# Patient Record
Sex: Female | Born: 1935
Health system: Southern US, Community
[De-identification: ages and names within clinical notes are randomized; demographics above are authoritative.]

## PROBLEM LIST (undated history)

## (undated) DIAGNOSIS — G629 Polyneuropathy, unspecified: Secondary | ICD-10-CM

## (undated) DIAGNOSIS — I10 Essential (primary) hypertension: Secondary | ICD-10-CM

## (undated) DIAGNOSIS — C801 Malignant (primary) neoplasm, unspecified: Secondary | ICD-10-CM

## (undated) DIAGNOSIS — Z860100 Personal history of colon polyps, unspecified: Secondary | ICD-10-CM

## (undated) DIAGNOSIS — K589 Irritable bowel syndrome without diarrhea: Secondary | ICD-10-CM

## (undated) DIAGNOSIS — K5730 Diverticulosis of large intestine without perforation or abscess without bleeding: Secondary | ICD-10-CM

## (undated) DIAGNOSIS — I499 Cardiac arrhythmia, unspecified: Secondary | ICD-10-CM

## (undated) DIAGNOSIS — F0390 Unspecified dementia without behavioral disturbance: Secondary | ICD-10-CM

## (undated) DIAGNOSIS — E039 Hypothyroidism, unspecified: Secondary | ICD-10-CM

## (undated) DIAGNOSIS — R002 Palpitations: Secondary | ICD-10-CM

## (undated) HISTORY — DX: Cardiac arrhythmia, unspecified: I49.9

## (undated) HISTORY — PX: ABDOMINAL HYSTERECTOMY: SHX81

## (undated) HISTORY — DX: Irritable bowel syndrome, unspecified: K58.9

## (undated) HISTORY — DX: Hypothyroidism, unspecified: E03.9

## (undated) HISTORY — DX: Personal history of colon polyps, unspecified: Z86.0100

## (undated) HISTORY — DX: Personal history of colonic polyps: Z86.010

## (undated) HISTORY — DX: Diverticulosis of large intestine without perforation or abscess without bleeding: K57.30

## (undated) HISTORY — PX: VAGINAL HYSTERECTOMY: SHX2639

## (undated) HISTORY — PX: FOOT ARTHRODESIS: SHX1655

---

## 1998-06-20 ENCOUNTER — Other Ambulatory Visit: Admission: RE | Admit: 1998-06-20 | Discharge: 1998-06-20 | Payer: Self-pay | Admitting: Gastroenterology

## 1999-06-22 ENCOUNTER — Emergency Department (HOSPITAL_COMMUNITY): Admission: EM | Admit: 1999-06-22 | Discharge: 1999-06-22 | Payer: Self-pay | Admitting: Emergency Medicine

## 2000-04-26 ENCOUNTER — Encounter: Payer: Self-pay | Admitting: Family Medicine

## 2000-04-26 ENCOUNTER — Encounter: Admission: RE | Admit: 2000-04-26 | Discharge: 2000-04-26 | Payer: Self-pay | Admitting: *Deleted

## 2001-04-29 ENCOUNTER — Encounter: Admission: RE | Admit: 2001-04-29 | Discharge: 2001-04-29 | Payer: Self-pay | Admitting: Pulmonary Disease

## 2002-04-30 ENCOUNTER — Encounter: Admission: RE | Admit: 2002-04-30 | Discharge: 2002-04-30 | Payer: Self-pay | Admitting: Pulmonary Disease

## 2003-02-02 ENCOUNTER — Ambulatory Visit (HOSPITAL_COMMUNITY): Admission: RE | Admit: 2003-02-02 | Discharge: 2003-02-02 | Payer: Self-pay | Admitting: Pulmonary Disease

## 2003-02-24 ENCOUNTER — Ambulatory Visit (HOSPITAL_COMMUNITY): Admission: RE | Admit: 2003-02-24 | Discharge: 2003-02-24 | Payer: Self-pay | Admitting: Pulmonary Disease

## 2003-05-04 ENCOUNTER — Encounter: Admission: RE | Admit: 2003-05-04 | Discharge: 2003-05-04 | Payer: Self-pay | Admitting: Pulmonary Disease

## 2003-12-26 ENCOUNTER — Emergency Department (HOSPITAL_COMMUNITY): Admission: EM | Admit: 2003-12-26 | Discharge: 2003-12-26 | Payer: Self-pay | Admitting: Emergency Medicine

## 2004-05-04 ENCOUNTER — Encounter: Admission: RE | Admit: 2004-05-04 | Discharge: 2004-05-04 | Payer: Self-pay | Admitting: Pulmonary Disease

## 2005-05-15 ENCOUNTER — Encounter: Admission: RE | Admit: 2005-05-15 | Discharge: 2005-05-15 | Payer: Self-pay | Admitting: Pulmonary Disease

## 2005-05-17 ENCOUNTER — Encounter: Admission: RE | Admit: 2005-05-17 | Discharge: 2005-05-17 | Payer: Self-pay | Admitting: Pulmonary Disease

## 2006-05-16 ENCOUNTER — Ambulatory Visit: Payer: Self-pay | Admitting: Gastroenterology

## 2006-05-21 ENCOUNTER — Encounter: Admission: RE | Admit: 2006-05-21 | Discharge: 2006-05-21 | Payer: Self-pay | Admitting: Pulmonary Disease

## 2006-05-27 ENCOUNTER — Encounter: Payer: Self-pay | Admitting: Gastroenterology

## 2006-05-27 ENCOUNTER — Ambulatory Visit: Payer: Self-pay | Admitting: Gastroenterology

## 2007-05-26 ENCOUNTER — Encounter: Admission: RE | Admit: 2007-05-26 | Discharge: 2007-05-26 | Payer: Self-pay | Admitting: Pulmonary Disease

## 2007-05-27 ENCOUNTER — Encounter: Admission: RE | Admit: 2007-05-27 | Discharge: 2007-05-27 | Payer: Self-pay | Admitting: Pulmonary Disease

## 2007-10-13 ENCOUNTER — Encounter: Admission: RE | Admit: 2007-10-13 | Discharge: 2007-10-13 | Payer: Self-pay | Admitting: Pulmonary Disease

## 2008-05-26 ENCOUNTER — Encounter: Admission: RE | Admit: 2008-05-26 | Discharge: 2008-05-26 | Payer: Self-pay | Admitting: Internal Medicine

## 2009-05-27 ENCOUNTER — Encounter: Admission: RE | Admit: 2009-05-27 | Discharge: 2009-05-27 | Payer: Self-pay | Admitting: Internal Medicine

## 2010-05-29 ENCOUNTER — Encounter
Admission: RE | Admit: 2010-05-29 | Discharge: 2010-05-29 | Payer: Self-pay | Source: Home / Self Care | Admitting: Internal Medicine

## 2010-08-03 ENCOUNTER — Emergency Department (HOSPITAL_COMMUNITY): Admission: EM | Admit: 2010-08-03 | Discharge: 2010-08-03 | Payer: Self-pay | Admitting: Emergency Medicine

## 2010-09-18 LAB — BASIC METABOLIC PANEL
BUN: 17 mg/dL (ref 6–23)
CO2: 30 mEq/L (ref 19–32)
Calcium: 10.1 mg/dL (ref 8.4–10.5)
Chloride: 103 mEq/L (ref 96–112)
Creatinine, Ser: 1.01 mg/dL (ref 0.4–1.2)
GFR calc Af Amer: >60 mL/min (ref >60)
GFR calc non Af Amer: 53 mL/min — ABNORMAL LOW (ref >60)
Glucose, Bld: 97 mg/dL (ref 70–99)
Potassium: 4.6 mEq/L (ref 3.5–5.1)
Sodium: 140 mEq/L (ref 135–145)

## 2010-09-18 LAB — SURGICAL PCR SCREEN
MRSA, PCR: NEGATIVE
Staphylococcus aureus: NEGATIVE

## 2010-09-18 LAB — HEMOGLOBIN AND HEMATOCRIT, BLOOD
HCT: 36.4 % (ref 36.0–46.0)
Hemoglobin: 12.5 g/dL (ref 12.0–15.0)

## 2010-09-21 ENCOUNTER — Ambulatory Visit (HOSPITAL_COMMUNITY)
Admission: RE | Admit: 2010-09-21 | Discharge: 2010-09-21 | Payer: Self-pay | Source: Home / Self Care | Attending: Podiatry | Admitting: Podiatry

## 2010-09-24 ENCOUNTER — Encounter: Payer: Self-pay | Admitting: Pulmonary Disease

## 2010-09-25 ENCOUNTER — Encounter: Payer: Self-pay | Admitting: Pulmonary Disease

## 2010-10-28 NOTE — Op Note (Signed)
Brittany Holden, Brittany Holden                ACCOUNT NO.:  [id number]  MEDICAL RECORD NO.:  [id number]          PATIENT TYPE:  AMB  LOCATION:  DAY                           FACILITY:  [facility]  PHYSICIAN:  B. Theola Sequin, MD   DATE OF BIRTH:  February 26, 1930  DATE OF PROCEDURE:  09/21/2010 DATE OF DISCHARGE:  09/21/2010                              OPERATIVE REPORT   SURGEON:  B. Theola Sequin, MD  ASSISTANT:  None.  PREOPERATIVE DIAGNOSES: 1. Hammertoe deformity, second digit left foot. 2. Hammertoe deformity, third digit left foot. 3. Hammertoe deformity, fourth digit left foot.  POSTOPERATIVE DIAGNOSES: 1. Hammertoe deformity, second digit left foot. 2. Hammertoe deformity, third digit left foot. 3. Hammertoe deformity, fourth digit left foot.  PROCEDURES: 1. Hammertoe repair, second digit left foot. 2. Hammertoe repair, third digit left foot. 3. Hammertoe repair, fourth digit left foot.  ANESTHESIA:  MAC with local.  HEMOSTASIS:  Pneumatic ankle tourniquet at 250 mmHg.  ESTIMATED BLOOD LOSS:  Minimal (less than 5 mL).  INJECTABLES:  0.5% Marcaine plain.  PATHOLOGY:  None.  COMPLICATIONS:  None.  PROCEDURE IN DETAIL:  The patient was brought to the operating room, placed on the operative table in supine position.  The pneumatic ankle tourniquet was placed about the patient's left ankle.  The foot was anesthetized using 0.5% Marcaine plain.  The foot was scrubbed, prepped and draped in usual sterile manner.  The limb was then elevated, exsanguinated, and the pneumatic ankle tourniquet inflated to 250 mmHg. Attention was then directed to the dorsal aspect of the second digit of the left foot where a linear longitudinal incision was performed. Dissection was continued deep down to level the proximal interphalangeal joint.  A transverse tenotomy and capsulotomy was performed at the level of the proximal interphalangeal joint.  Soft tissues were reflected, thus exposing the head  of the proximal phalanx and base of the middle phalanx at the operative site.  The articular surface from the head of proximal phalanx and base of the middle phalanx were resected using a power bone saw.  A 2.0 mm Allofix cortical bone pin was inserted across the osteotomy site.  The contracture of the proximal interphalangeal joint was noted to be reduced.  However, persistent contracture of the distal and interphalangeal joint was noted.  A transverse percutaneous tenotomy was performed plantar to the distal interphalangeal joint.  The contracture noted at this level of the digit was noted to be reduced.  The extensor tendon was reapproximated using 4-0 Vicryl and subcutaneous structure reapproximated in 4-0 Vicryl and skin reapproximated using 5-0 Monocryl in a horizontal mattress technique.  Same procedure was performed to the third digit of the left foot, that was performed to the second digit of the left foot without alterations or omission.  The same procedure was performed to the fourth digit of the left, that was performed to the second digit without any alterations or omissions. Sterile compressive dressing was then applied to left foot.  The pneumatic ankle tourniquet deflated and prompt hyperemic response was noted to all digits of the left foot.  ______________________________ B. Theola Sequin, MD     BIM/MEDQ  D:  09/21/2010  T:  09/22/2010  Job:  [id number]  Electronically Signed by Rexford Maus  on 10/28/2010 11:07:06 AM

## 2011-01-15 ENCOUNTER — Telehealth: Payer: Self-pay | Admitting: Gastroenterology

## 2011-01-15 MED ORDER — LUBIPROSTONE 8 MCG PO CAPS: 8.0000 ug | ORAL_CAPSULE | Freq: Two times a day (BID) | ORAL | Status: DC

## 2011-01-15 NOTE — Telephone Encounter (Signed)
Notified pt Dr Jarold Motto recommends Miralax bid and Amitiza bid. I left samples for pt of Amitiza. Pt stated understanding.

## 2011-01-15 NOTE — Telephone Encounter (Signed)
miralax bid and try amitiza 8 mcg bid

## 2011-01-15 NOTE — Telephone Encounter (Signed)
Pt with hx of COLONs only with Dr Jarold Motto. COLON 05/28/2001- Diverticulosis, COLON 05/27/2006- Diverticulosis and Polyp. Pt called to report she has chronic constipation and has been taking OTC women's laxative and Align per her PCP. Pt reports she has terrible gas after she eats . Pt thinks the laxative is doing it because she hasn't had any today and she didn't take the laxative last pm. Pt is due for another COLON this September and has made an appt for 02/20/11. Can we recommend something until the appt? Thanks.

## 2011-01-16 ENCOUNTER — Telehealth: Payer: Self-pay | Admitting: Gastroenterology

## 2011-01-16 NOTE — Telephone Encounter (Addendum)
Pt reports she is still constipated even though she took the Miralax and Amitiza . She hasn't had a BM since Sunday and she had terrible pain last pm. Explained the Miralax and Amitiza are fast acting laxatives that don't work like the OTC harsh laxatives. Pt became very emotional and cried stating she feels as though she has a blockage or something is wrong. We started over and I asked if she had chronic constipation and she stated yes and I stated she probably is laxative dependent. Pt remained upset and stated she couldn't wait until September for her COLON- explained if she's having problems, Dr Jarold Motto can do an earlier COLON. Dr Jarold Motto had a cancellation on 01/18/11 and pt will come then.

## 2011-01-18 ENCOUNTER — Encounter: Payer: Self-pay | Admitting: Gastroenterology

## 2011-01-18 ENCOUNTER — Ambulatory Visit (INDEPENDENT_AMBULATORY_CARE_PROVIDER_SITE_OTHER): Payer: Medicare Other | Admitting: Gastroenterology

## 2011-01-18 ENCOUNTER — Other Ambulatory Visit (INDEPENDENT_AMBULATORY_CARE_PROVIDER_SITE_OTHER): Payer: Medicare Other

## 2011-01-18 DIAGNOSIS — K5900 Constipation, unspecified: Secondary | ICD-10-CM

## 2011-01-18 DIAGNOSIS — K589 Irritable bowel syndrome without diarrhea: Secondary | ICD-10-CM

## 2011-01-18 DIAGNOSIS — Z79899 Other long term (current) drug therapy: Secondary | ICD-10-CM

## 2011-01-18 LAB — FERRITIN: Ferritin: 26.2 ng/mL (ref 10.0–291.0)

## 2011-01-18 LAB — FOLATE: Folate: >24.8 ng/mL (ref >5.9)

## 2011-01-18 LAB — IBC PANEL: Iron: 83 ug/dL (ref 42–145)

## 2011-01-18 MED ORDER — ALIGN PO CAPS: 1.0000 | ORAL_CAPSULE | Freq: Every day | ORAL | Status: AC

## 2011-01-18 MED ORDER — PEG-KCL-NACL-NASULF-NA ASC-C 100 G PO SOLR: 1.0000 | Freq: Once | ORAL | Status: AC

## 2011-01-18 NOTE — Progress Notes (Signed)
History of Present Illness:  This is a 77 year old Caucasian female who is in overall excellent health. She has had worsening constipation since the death of her husband one year ago. 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 There is no history of rectal bleeding, lower bowel pain, but she does have gas and bloating. She has had a 10 pound weight loss probably related to her change in her cooking habits. She denies a specific food intolerances, upper gastrointestinal, or hepatobiliary problems. She is now on narcotics or NSAIDs, denies use of alcohol or cigarettes. Recent laboratory data showed a normal metabolic profile. He is on Lanoxin, simvastatin, Diovan, and levothyroxine. Review of her chart shows previous colonoscopies with adenomatous polyps, last examined 2007. She has been diagnosed as having irritable bowel syndrome exacerbated by chronic anxiety.  Becausem of these complaints she was placed on MiraLax and Amitiza but has not used these medications.  I have reviewed this patient's present history, medical and surgical past history, allergies and medications.     ROS: The remainder of the 10 point ROS is negative  Past Medical History  Diagnosis Date  . Diverticulosis of colon (without mention of hemorrhage)   . Personal history of colonic polyps 1999 & 2007    hyperplastic   . IBS (irritable bowel syndrome)   . Arrhythmia   . Hypothyroidism    Past Surgical History  Procedure Date  . Vaginal hysterectomy     reports that she has never smoked. She has never used smokeless tobacco. She reports that she does not drink alcohol or use illicit drugs. family history is not on file. No Known Allergies      Physical Exam: General well developed well nourished patient in no acute distress, appearing her stated age Eyes PERRLA, no icterus, fundoscopic exam per opthamologist Skin no lesions noted Neck supple, no adenopathy, no thyroid enlargement, no tenderness Chest clear to  percussion and auscultation Heart no significant murmurs, gallops or rubs noted Abdomen no hepatosplenomegaly masses or tenderness, BS normal.  Rectal inspection normal no fissures, or fistulae noted.  No masses or tenderness on digital exam. Stool guaiac negative. Extremities no acute joint lesions, edema, phlebitis or evidence of cellulitis. Neurologic patient oriented x 3, cranial nerves intact, no focal neurologic deficits noted. Psychological mental status normal and normal affect.  Assessment and plan: Long history of constipation predominant IBS with recent exacerbation probably related to lack of fiber in her diet and lack of by mouth fluids. She is due for followup colonoscopy which has been scheduled along with lab review. I have added probiotics to her regime and we will give adequate time to see if Amitiza is of use.  Encounter Diagnosis  Name Primary?  . Constipation Yes

## 2011-01-18 NOTE — Patient Instructions (Signed)
Your procedure has been scheduled for 01/19/2011, please follow the seperate instructions.  Please go to the basement today for your labs.  Your prescription(s) have been sent to you pharmacy.   

## 2011-01-19 ENCOUNTER — Ambulatory Visit (AMBULATORY_SURGERY_CENTER): Payer: Medicare Other | Admitting: Gastroenterology

## 2011-01-19 ENCOUNTER — Encounter: Payer: Self-pay | Admitting: Gastroenterology

## 2011-01-19 VITALS — BP 135/73 | HR 68 | Temp 98.2°F | Resp 18 | Ht 72.0 in | Wt 135.0 lb

## 2011-01-19 DIAGNOSIS — K5900 Constipation, unspecified: Secondary | ICD-10-CM

## 2011-01-19 DIAGNOSIS — K621 Rectal polyp: Secondary | ICD-10-CM

## 2011-01-19 DIAGNOSIS — K620 Anal polyp: Secondary | ICD-10-CM

## 2011-01-19 DIAGNOSIS — D126 Benign neoplasm of colon, unspecified: Secondary | ICD-10-CM

## 2011-01-19 DIAGNOSIS — Z1211 Encounter for screening for malignant neoplasm of colon: Secondary | ICD-10-CM

## 2011-01-19 LAB — CELIAC PANEL 10
Gliadin IgA: 11 U/mL (ref <20)
Gliadin IgG: 11.9 U/mL (ref <20)
IgA: 170 mg/dL (ref 68–378)
Tissue Transglut Ab: 10.6 U/mL (ref <20)

## 2011-01-19 MED ORDER — SODIUM CHLORIDE 0.9 % IV SOLN: 500.0000 mL | INTRAVENOUS | Status: DC

## 2011-01-19 NOTE — Patient Instructions (Signed)
Polyp removed today. Repeat colonoscopy in 5 years if not pre-cancerous and 10 yrs if pre-cancerous (either way the polyp is not true cancer and has been removed-polyps are considered to be benign) Try Amitiza (samples given)24 mcg twice daily and at bedtime take Miralax

## 2011-01-22 ENCOUNTER — Telehealth: Payer: Self-pay

## 2011-01-22 NOTE — Telephone Encounter (Signed)
No answer. No caller ID on answering machine.

## 2011-01-22 NOTE — Telephone Encounter (Signed)
No answer. No ID on answering machine. 

## 2011-01-23 ENCOUNTER — Encounter: Payer: Self-pay | Admitting: Gastroenterology

## 2011-02-20 ENCOUNTER — Ambulatory Visit: Payer: Self-pay | Admitting: Gastroenterology

## 2011-02-22 ENCOUNTER — Ambulatory Visit: Payer: Self-pay | Admitting: Gastroenterology

## 2011-04-04 ENCOUNTER — Other Ambulatory Visit: Payer: Self-pay | Admitting: Internal Medicine

## 2011-04-04 DIAGNOSIS — Z1231 Encounter for screening mammogram for malignant neoplasm of breast: Secondary | ICD-10-CM

## 2011-06-06 ENCOUNTER — Ambulatory Visit
Admission: RE | Admit: 2011-06-06 | Discharge: 2011-06-06 | Disposition: A | Discharge status: Home / Self Care | Payer: Medicare Other | Source: Ambulatory Visit | Attending: Internal Medicine | Admitting: Internal Medicine

## 2011-06-06 DIAGNOSIS — Z1231 Encounter for screening mammogram for malignant neoplasm of breast: Secondary | ICD-10-CM

## 2012-04-28 ENCOUNTER — Other Ambulatory Visit: Payer: Self-pay | Admitting: Internal Medicine

## 2012-04-28 DIAGNOSIS — Z1231 Encounter for screening mammogram for malignant neoplasm of breast: Secondary | ICD-10-CM

## 2012-06-11 ENCOUNTER — Ambulatory Visit: Payer: Medicare Other

## 2012-06-11 ENCOUNTER — Ambulatory Visit
Admission: RE | Admit: 2012-06-11 | Discharge: 2012-06-11 | Disposition: A | Discharge status: Home / Self Care | Payer: Medicare Other | Source: Ambulatory Visit | Attending: Internal Medicine | Admitting: Internal Medicine

## 2012-06-11 DIAGNOSIS — Z1231 Encounter for screening mammogram for malignant neoplasm of breast: Secondary | ICD-10-CM

## 2012-06-19 ENCOUNTER — Other Ambulatory Visit: Payer: Self-pay | Admitting: Otolaryngology

## 2012-06-19 ENCOUNTER — Ambulatory Visit (INDEPENDENT_AMBULATORY_CARE_PROVIDER_SITE_OTHER): Payer: Medicare Other | Admitting: Otolaryngology

## 2012-06-19 DIAGNOSIS — D3705 Neoplasm of uncertain behavior of pharynx: Secondary | ICD-10-CM

## 2012-06-19 DIAGNOSIS — D3701 Neoplasm of uncertain behavior of lip: Secondary | ICD-10-CM

## 2012-06-20 ENCOUNTER — Encounter (HOSPITAL_COMMUNITY): Payer: Self-pay

## 2012-06-20 NOTE — Patient Instructions (Addendum)
[facility]  06/20/2012   Your procedure is scheduled on:  06/26/2012  Report to [facility] at  615  AM.  Call this number if you have problems the morning of surgery: [phone]   Remember:   Do not eat food:After Midnight.  May have clear liquids:until Midnight .    Take these medicines the morning of surgery with A SIP OF WATER:  Lanoxin,synthroid,diovan   Do not wear jewelry, make-up or nail polish.  Do not wear lotions, powders, or perfumes. You may wear deodorant.  Do not shave 48 hours prior to surgery. Men may shave face and neck.  Do not bring valuables to the hospital.  Contacts, dentures or bridgework may not be worn into surgery.  Leave suitcase in the car. After surgery it may be brought to your room.  For patients admitted to the hospital, checkout time is 11:00 AM the day of discharge.   Patients discharged the day of surgery will not be allowed to drive home.  Name and phone number of your driver: family  Special Instructions: Shower using CHG 2 nights before surgery and the night before surgery.  If you shower the day of surgery use CHG.  Use special wash - you have one bottle of CHG for all showers.  You should use approximately 1/3 of the bottle for each shower.   Please read over the following fact sheets that you were given: Pain Booklet, MRSA Information, Surgical Site Infection Prevention, Anesthesia Post-op Instructions and Care and Recovery After Surgery Tonsillectomy, Adult Care After Refer to this sheet in the next few weeks. These instructions provide you with information on caring for yourself after your procedure. Your caregiver may also give you specific instructions. Your treatment has been planned according to current medical practices, but problems sometimes occur. Call your caregiver if you have any problems or questions after your procedure. HOME CARE INSTRUCTIONS   Obtain proper rest, keeping your head elevated at all times. You will  feel worn out and tired for a while.  Drink plenty of fluids. This reduces pain and hastens the healing process.  Only take over-the-counter or prescription medicines for pain, discomfort, or fever as directed by your caregiver. Do not take aspirin or nonsteroidal anti-inflammatory drugs. These medications increase the possibility of bleeding.  Sometimes the use of pain medication can cause constipation. If this happens, ask your caregiver about laxatives that you can take.  When eating, only eat a small portion of your food and then take your prescribed pain medication. Eat the remainder of your food 45 minutes later. This will make swallowing less painful.  Soft and cold foods, such as gelatin, sherbet, ice cream, frozen ice pops, and cold drinks, are usually the easiest to eat. Several days after surgery, you will be able to eat more solid food.  Avoid mouth washes and gargles.  Avoid contact with people who have upper respiratory infections, such as colds and sore throats.  An ice pack applied to your neck may help with discomfort and keep swelling down. SEEK MEDICAL CARE IF:   You have increasing pain that is not controlled with medications.  You have an oral temperature above 102 F (38.9 C).  You feel lightheaded or have a fainting spell.  You develop a rash. SEEK IMMEDIATE MEDICAL CARE IF:   You have difficulty breathing.  You experience side effects or allergic reactions to medications.  You bleed bright red blood from your throat, or  you vomit bright red blood. MAKE SURE YOU:  Understand these instructions.  Will watch your condition.  Will get help right away if you are not doing well or get worse. Document Released: 06/20/2004 Document Revised: 11/12/2011 Document Reviewed: 10/26/2010 [facility] Patient Information 2013 [place], [place]. PATIENT INSTRUCTIONS POST-ANESTHESIA  IMMEDIATELY FOLLOWING SURGERY:  Do not drive or operate machinery for the first twenty  four hours after surgery.  Do not make any important decisions for twenty four hours after surgery or while taking narcotic pain medications or sedatives.  If you develop intractable nausea and vomiting or a severe headache please notify your doctor immediately.  FOLLOW-UP:  Please make an appointment with your surgeon as instructed. You do not need to follow up with anesthesia unless specifically instructed to do so.  WOUND CARE INSTRUCTIONS (if applicable):  Keep a dry clean dressing on the anesthesia/puncture wound site if there is drainage.  Once the wound has quit draining you may leave it open to air.  Generally you should leave the bandage intact for twenty four hours unless there is drainage.  If the epidural site drains for more than 36-48 hours please call the anesthesia department.  QUESTIONS?:  Please feel free to call your physician or the hospital operator if you have any questions, and they will be happy to assist you.

## 2012-06-23 ENCOUNTER — Other Ambulatory Visit: Payer: Self-pay

## 2012-06-23 ENCOUNTER — Encounter (HOSPITAL_COMMUNITY): Payer: Self-pay

## 2012-06-23 ENCOUNTER — Encounter (HOSPITAL_COMMUNITY)
Admission: RE | Admit: 2012-06-23 | Discharge: 2012-06-23 | Disposition: A | Discharge status: Home / Self Care | Payer: Medicare Other | Source: Ambulatory Visit | Attending: Otolaryngology | Admitting: Otolaryngology

## 2012-06-23 HISTORY — DX: Polyneuropathy, unspecified: G62.9

## 2012-06-23 LAB — BASIC METABOLIC PANEL
BUN: 15 mg/dL (ref 6–23)
Chloride: 103 mEq/L (ref 96–112)
GFR calc Af Amer: 44 mL/min — ABNORMAL LOW (ref >90)
Potassium: 4.8 mEq/L (ref 3.5–5.1)
Sodium: 138 mEq/L (ref 135–145)

## 2012-06-23 LAB — HEMOGLOBIN AND HEMATOCRIT, BLOOD
HCT: 34.2 % — ABNORMAL LOW (ref 36.0–46.0)
Hemoglobin: 11.5 g/dL — ABNORMAL LOW (ref 12.0–15.0)

## 2012-06-23 LAB — SURGICAL PCR SCREEN
MRSA, PCR: NEGATIVE
Staphylococcus aureus: NEGATIVE

## 2012-06-26 ENCOUNTER — Encounter (HOSPITAL_COMMUNITY)
Admission: RE | Disposition: A | Discharge status: Home / Self Care | Payer: Self-pay | Source: Ambulatory Visit | Attending: Otolaryngology

## 2012-06-26 ENCOUNTER — Encounter (HOSPITAL_COMMUNITY): Payer: Self-pay | Admitting: Anesthesiology

## 2012-06-26 ENCOUNTER — Ambulatory Visit (HOSPITAL_COMMUNITY): Payer: Medicare Other | Admitting: Anesthesiology

## 2012-06-26 ENCOUNTER — Ambulatory Visit (HOSPITAL_COMMUNITY)
Admission: RE | Admit: 2012-06-26 | Discharge: 2012-06-26 | Disposition: A | Discharge status: Home / Self Care | Payer: Medicare Other | Source: Ambulatory Visit | Attending: Otolaryngology | Admitting: Otolaryngology

## 2012-06-26 ENCOUNTER — Encounter (HOSPITAL_COMMUNITY): Payer: Self-pay | Admitting: *Deleted

## 2012-06-26 DIAGNOSIS — J358 Other chronic diseases of tonsils and adenoids: Secondary | ICD-10-CM | POA: Insufficient documentation

## 2012-06-26 DIAGNOSIS — D3701 Neoplasm of uncertain behavior of lip: Secondary | ICD-10-CM

## 2012-06-26 DIAGNOSIS — Z9089 Acquired absence of other organs: Secondary | ICD-10-CM

## 2012-06-26 DIAGNOSIS — Z01812 Encounter for preprocedural laboratory examination: Secondary | ICD-10-CM | POA: Insufficient documentation

## 2012-06-26 DIAGNOSIS — Z01810 Encounter for preprocedural cardiovascular examination: Secondary | ICD-10-CM | POA: Insufficient documentation

## 2012-06-26 DIAGNOSIS — D3705 Neoplasm of uncertain behavior of pharynx: Secondary | ICD-10-CM

## 2012-06-26 DIAGNOSIS — D3709 Neoplasm of uncertain behavior of other specified sites of the oral cavity: Secondary | ICD-10-CM

## 2012-06-26 DIAGNOSIS — I4891 Unspecified atrial fibrillation: Secondary | ICD-10-CM | POA: Insufficient documentation

## 2012-06-26 HISTORY — PX: TONSILLECTOMY: SHX5217

## 2012-06-26 SURGERY — TONSILLECTOMY
Anesthesia: General | Site: Throat | Laterality: Right | Wound class: Clean Contaminated

## 2012-06-26 MED ORDER — OXYMETAZOLINE HCL 0.05 % NA SOLN
NASAL | Status: DC | PRN
  Administered 2012-06-26: 1 via NASAL

## 2012-06-26 MED ORDER — PROPOFOL 10 MG/ML IV BOLUS
INTRAVENOUS | Status: DC | PRN
  Administered 2012-06-26: 110 mg via INTRAVENOUS

## 2012-06-26 MED ORDER — GLYCOPYRROLATE 0.2 MG/ML IJ SOLN
INTRAMUSCULAR | Status: AC
  Filled 2012-06-26: qty 1

## 2012-06-26 MED ORDER — LACTATED RINGERS IV SOLN
INTRAVENOUS | Status: DC
  Administered 2012-06-26: 1000 mL via INTRAVENOUS

## 2012-06-26 MED ORDER — SUCCINYLCHOLINE CHLORIDE 20 MG/ML IJ SOLN
INTRAMUSCULAR | Status: DC | PRN
  Administered 2012-06-26: 100 mg via INTRAVENOUS

## 2012-06-26 MED ORDER — LIDOCAINE HCL 1 % IJ SOLN
INTRAMUSCULAR | Status: DC | PRN
  Administered 2012-06-26: 30 mg via INTRADERMAL

## 2012-06-26 MED ORDER — MIDAZOLAM HCL 2 MG/2ML IJ SOLN
INTRAMUSCULAR | Status: AC
  Filled 2012-06-26: qty 2

## 2012-06-26 MED ORDER — ONDANSETRON HCL 4 MG/2ML IJ SOLN
INTRAMUSCULAR | Status: AC
  Filled 2012-06-26: qty 2

## 2012-06-26 MED ORDER — ONDANSETRON HCL 4 MG/2ML IJ SOLN
4.0000 mg | Freq: Once | INTRAMUSCULAR | Status: AC
  Administered 2012-06-26: 4 mg via INTRAVENOUS

## 2012-06-26 MED ORDER — FENTANYL CITRATE 0.05 MG/ML IJ SOLN
INTRAMUSCULAR | Status: AC
  Filled 2012-06-26: qty 5

## 2012-06-26 MED ORDER — ROCURONIUM BROMIDE 50 MG/5ML IV SOLN
INTRAVENOUS | Status: AC
  Filled 2012-06-26: qty 1

## 2012-06-26 MED ORDER — ROCURONIUM BROMIDE 100 MG/10ML IV SOLN
INTRAVENOUS | Status: DC | PRN
  Administered 2012-06-26: 5 mg via INTRAVENOUS

## 2012-06-26 MED ORDER — OXYCODONE-ACETAMINOPHEN 5-325 MG/5ML PO SOLN: 5.0000 mL | ORAL | Status: DC | PRN

## 2012-06-26 MED ORDER — LIDOCAINE HCL (PF) 1 % IJ SOLN
INTRAMUSCULAR | Status: AC
  Filled 2012-06-26: qty 5

## 2012-06-26 MED ORDER — AMOXICILLIN 400 MG/5ML PO SUSR: 800.0000 mg | Freq: Two times a day (BID) | ORAL | Status: AC

## 2012-06-26 MED ORDER — PROPOFOL 10 MG/ML IV EMUL
INTRAVENOUS | Status: AC
  Filled 2012-06-26: qty 20

## 2012-06-26 MED ORDER — FENTANYL CITRATE 0.05 MG/ML IJ SOLN: 25.0000 ug | INTRAMUSCULAR | Status: DC | PRN

## 2012-06-26 MED ORDER — ONDANSETRON HCL 4 MG/2ML IJ SOLN: 4.0000 mg | Freq: Once | INTRAMUSCULAR | Status: DC | PRN

## 2012-06-26 MED ORDER — GLYCOPYRROLATE 0.2 MG/ML IJ SOLN
0.2000 mg | Freq: Once | INTRAMUSCULAR | Status: AC
  Administered 2012-06-26: 0.2 mg via INTRAVENOUS

## 2012-06-26 MED ORDER — SODIUM CHLORIDE 0.9 % IR SOLN
Status: DC | PRN
  Administered 2012-06-26: 1000 mL

## 2012-06-26 MED ORDER — SUCCINYLCHOLINE CHLORIDE 20 MG/ML IJ SOLN
INTRAMUSCULAR | Status: AC
  Filled 2012-06-26: qty 1

## 2012-06-26 MED ORDER — MIDAZOLAM HCL 2 MG/2ML IJ SOLN
1.0000 mg | INTRAMUSCULAR | Status: DC | PRN
  Administered 2012-06-26: 2 mg via INTRAVENOUS

## 2012-06-26 MED ORDER — FENTANYL CITRATE 0.05 MG/ML IJ SOLN
INTRAMUSCULAR | Status: DC | PRN
  Administered 2012-06-26 (×2): 50 ug via INTRAVENOUS

## 2012-06-26 SURGICAL SUPPLY — 32 items
BAG HAMPER (MISCELLANEOUS) ×2 IMPLANT
CATH ROBINSON RED A/P 10FR (CATHETERS) ×2 IMPLANT
CLEANER TIP ELECTROSURG 2X2 (MISCELLANEOUS) IMPLANT
CLOTH BEACON ORANGE TIMEOUT ST (SAFETY) ×2 IMPLANT
COAGULATOR SUCT SWTCH 10FR 6 (ELECTROSURGICAL) IMPLANT
ELECT COATED BLADE 2.86 ST (ELECTRODE) IMPLANT
ELECT REM PT RETURN 9FT ADLT (ELECTROSURGICAL) ×2
ELECTRODE REM PT RTRN 9FT ADLT (ELECTROSURGICAL) ×1 IMPLANT
FORMALIN 10 PREFIL 120ML (MISCELLANEOUS) ×1 IMPLANT
GAUZE SPONGE 4X4 16PLY XRAY LF (GAUZE/BANDAGES/DRESSINGS) ×2 IMPLANT
GLOVE BIO SURGEON STRL SZ7.5 (GLOVE) ×2 IMPLANT
GLOVE BIOGEL PI IND STRL 7.5 (GLOVE) IMPLANT
GLOVE BIOGEL PI INDICATOR 7.5 (GLOVE) ×1
GLOVE ECLIPSE 7.0 STRL STRAW (GLOVE) ×1 IMPLANT
GOWN STRL REIN XL XLG (GOWN DISPOSABLE) ×2 IMPLANT
IV NS 1000ML (IV SOLUTION) ×2
IV NS 1000ML BAXH (IV SOLUTION) ×1 IMPLANT
KIT ROOM TURNOVER AP CYSTO (KITS) ×2 IMPLANT
MANIFOLD NEPTUNE II (INSTRUMENTS) ×2 IMPLANT
MARKER SKIN DUAL TIP RULER LAB (MISCELLANEOUS) ×2 IMPLANT
PACK BASIC III (CUSTOM PROCEDURE TRAY) ×2
PACK SRG BSC III STRL LF ECLPS (CUSTOM PROCEDURE TRAY) ×1 IMPLANT
PAD ARMBOARD 7.5X6 YLW CONV (MISCELLANEOUS) ×2 IMPLANT
PENCIL FOOT CONTROL (ELECTRODE) IMPLANT
SET BASIN LINEN APH (SET/KITS/TRAYS/PACK) ×2 IMPLANT
SPONGE TONSIL 1 RF SGL (DISPOSABLE) ×2 IMPLANT
SYR BULB 3OZ (MISCELLANEOUS) ×2 IMPLANT
TOWEL OR 17X26 4PK STRL BLUE (TOWEL DISPOSABLE) ×2 IMPLANT
TUBE SALEM SUMP 12R W/ARV (TUBING) ×2 IMPLANT
WAND COBLATOR 70 EVAC XTRA (SURGICAL WAND) ×2 IMPLANT
WATER STERILE IRR 1000ML POUR (IV SOLUTION) ×2 IMPLANT
YANKAUER SUCT BULB TIP 10FT TU (MISCELLANEOUS) ×2 IMPLANT

## 2012-06-26 NOTE — Anesthesia Procedure Notes (Signed)
Procedure Name: Intubation Date/Time: 06/26/2012 7:47 AM Performed by: Glynn Octave E Pre-anesthesia Checklist: Patient identified, Patient being monitored, Timeout performed, Emergency Drugs available and Suction available Patient Re-evaluated:Patient Re-evaluated prior to inductionOxygen Delivery Method: Circle System Utilized Preoxygenation: Pre-oxygenation with 100% oxygen Intubation Type: IV induction Ventilation: Mask ventilation without difficulty Laryngoscope Size: Mac and 3 Grade View: Grade I Tube type: Oral Tube size: 7.0 mm Number of attempts: 1 Airway Equipment and Method: stylet Placement Confirmation: ETT inserted through vocal cords under direct vision,  positive ETCO2 and breath sounds checked- equal and bilateral Secured at: 21 cm Tube secured with: Tape Dental Injury: Teeth and Oropharynx as per pre-operative assessment

## 2012-06-26 NOTE — Op Note (Signed)
Brittany Holden, BLACKSHER                ACCOUNT NO.:  [id number]  MEDICAL RECORD NO.:  [id number]  LOCATION:  [facility]                          FACILITY:  [facility]  PHYSICIAN:  Newman Pies, MD            DATE OF BIRTH:  July 14, 1930  DATE OF PROCEDURE:  06/26/2012 DATE OF DISCHARGE:  06/26/2012                              OPERATIVE REPORT   PREOPERATIVE DIAGNOSIS:  Right tonsillar mass.  POSTOPERATIVE DIAGNOSIS:  Right tonsillar mass.  PROCEDURE PERFORMED:  Right tonsillectomy.  ANESTHESIA:  General endotracheal tube anesthesia.  COMPLICATIONS:  None.  ESTIMATED BLOOD LOSS:  Minimal.  INDICATION FOR PROCEDURE:  The patient is a 78 year old female with a cystic mass at the superior portion of the right tonsil.  The mass has been present for the past year.  It has caused significant anxiety for the patient.  As a result, the decision was made for the patient to undergo the right tonsillectomy procedure.  The risks, benefits, alternatives, and details of the procedure were discussed with the patient.  Questions were invited and answered.  Informed consent was obtained.  DESCRIPTION:  The patient was taken to the operating room and placed supine on the operating table.  General endotracheal tube anesthesia was administered by the anesthesiologist.  The patient was positioned and prepped and draped in a standard fashion for tonsillectomy.  A Crowe- Davis mouth gag was inserted into the oral cavity for exposure.  A 1 cm benign-appearing mass is noted at the right superior tonsillar fossa. The right tonsil was then grasped with a straight Allis clamp and retracted medially.  It was resected free from the underlying pharyngeal constrictor muscles with the Coblator device.  The entire tonsils, including the superior tonsillar cyst was sent to the pathology department for permanent histologic identification.  The surgical site was copiously irrigated.  The mouth gag was removed.  The care of  the patient was turned over to the anesthesiologist.  The patient was awakened from anesthesia without difficulty.  She was transferred to the recovery room in good condition.  OPERATIVE FINDINGS:  A 1 cm right superior tonsillar mass is again noted.  SPECIMEN:  Right tonsils.  FOLLOWUP CARE:  The patient will be discharged home once she is awake and alert.  She will be placed on Roxicet 5 mL q.4 hours p.r.n. pain, and amoxicillin 800 mg p.o. b.i.d. for 5 days.  The patient will follow up in my office in 2 weeks.     Newman Pies, MD     ST/MEDQ  D:  06/26/2012  T:  06/26/2012  Job:  [id number]  cc:   Catalina Pizza, M.D. Fax: [phone]

## 2012-06-26 NOTE — Anesthesia Postprocedure Evaluation (Signed)
  Anesthesia Post-op Note  Patient: Brittany Holden  Procedure(s) Performed: Procedure(s) (LRB) with comments: TONSILLECTOMY (Right)  Patient Location: PACU  Anesthesia Type: General  Level of Consciousness: awake, alert  and oriented  Airway and Oxygen Therapy: Patient Spontanous Breathing and Patient connected to face mask oxygen  Post-op Pain: none  Post-op Assessment: Post-op Vital signs reviewed, Patient's Cardiovascular Status Stable, Respiratory Function Stable, Patent Airway and No signs of Nausea or vomiting  Post-op Vital Signs: Reviewed and stable  Complications: No apparent anesthesia complications

## 2012-06-26 NOTE — Brief Op Note (Signed)
06/26/2012  8:03 AM  PATIENT:  Brittany Holden  78 y.o. female  PRE-OPERATIVE DIAGNOSIS:  right tonsil mass  POST-OPERATIVE DIAGNOSIS:  right tonsil mass  PROCEDURE:  Procedure(s) (LRB) with comments: TONSILLECTOMY (Right)  SURGEON:  Surgeon(s) and Role:    * Darletta Moll, MD - Primary  PHYSICIAN ASSISTANT:   ASSISTANTS: none   ANESTHESIA:   general  EBL:  Total I/O In: 400 [I.V.:400] Out: 0   BLOOD ADMINISTERED:none  DRAINS: none   LOCAL MEDICATIONS USED:  NONE  SPECIMEN:  Source of Specimen:  right tonsil  DISPOSITION OF SPECIMEN:  PATHOLOGY  COUNTS:  YES  TOURNIQUET:  * No tourniquets in log *  DICTATION: .Other Dictation: Dictation Number [id number]  PLAN OF CARE: Discharge to home after PACU  PATIENT DISPOSITION:  PACU - hemodynamically stable.   Delay start of Pharmacological VTE agent (>24hrs) due to surgical blood loss or risk of bleeding: not applicable

## 2012-06-26 NOTE — H&P (Signed)
  H&P Update  Pt's original H&P dated 06/19/12 reviewed and placed in chart (to be scanned).  I personally examined the patient today.  No change in health. Proceed with right tonsillectomy.

## 2012-06-26 NOTE — Progress Notes (Signed)
Awake. resp adequate/nonlabored. Scant amt bloody drainage in back of throat. Denies pain.

## 2012-06-26 NOTE — Transfer of Care (Signed)
Immediate Anesthesia Transfer of Care Note  Patient: Brittany Holden  Procedure(s) Performed: Procedure(s) (LRB) with comments: TONSILLECTOMY (Right)  Patient Location: PACU  Anesthesia Type: General  Level of Consciousness: awake, alert  and oriented  Airway & Oxygen Therapy: Patient Spontanous Breathing and Patient connected to face mask oxygen  Post-op Assessment: Report given to PACU RN  Post vital signs: Reviewed and stable  Complications: No apparent anesthesia complications

## 2012-06-26 NOTE — Anesthesia Preprocedure Evaluation (Signed)
Anesthesia Evaluation  Patient identified by MRN, date of birth, ID band Patient awake    Reviewed: Allergy & Precautions, H&P , NPO status , Patient's Chart, lab work & pertinent test results  History of Anesthesia Complications Negative for: history of anesthetic complications  Airway Mallampati: II TM Distance: <3 FB     Dental  (+) Edentulous Upper   Pulmonary neg pulmonary ROS,  breath sounds clear to auscultation        Cardiovascular - angina+ dysrhythmias Atrial Fibrillation Rhythm:Irregular Rate:Normal     Neuro/Psych    GI/Hepatic   Endo/Other  Hypothyroidism   Renal/GU      Musculoskeletal   Abdominal   Peds  Hematology   Anesthesia Other Findings   Reproductive/Obstetrics                           Anesthesia Physical Anesthesia Plan  ASA: III  Anesthesia Plan: General   Post-op Pain Management:    Induction: Intravenous  Airway Management Planned: Oral ETT  Additional Equipment:   Intra-op Plan:   Post-operative Plan: Extubation in OR  Informed Consent: I have reviewed the patients History and Physical, chart, labs and discussed the procedure including the risks, benefits and alternatives for the proposed anesthesia with the patient or authorized representative who has indicated his/her understanding and acceptance.     Plan Discussed with:   Anesthesia Plan Comments:         Anesthesia Quick Evaluation

## 2012-06-26 NOTE — Progress Notes (Signed)
Awake. Ice chips offered/accepted. Tolerated well. Denies pain. Refuses pain med.

## 2012-06-30 ENCOUNTER — Encounter (HOSPITAL_COMMUNITY): Payer: Self-pay | Admitting: Otolaryngology

## 2012-07-10 ENCOUNTER — Ambulatory Visit (INDEPENDENT_AMBULATORY_CARE_PROVIDER_SITE_OTHER): Payer: Medicare Other | Admitting: Otolaryngology

## 2013-05-11 ENCOUNTER — Other Ambulatory Visit: Payer: Self-pay

## 2013-05-11 DIAGNOSIS — Z1231 Encounter for screening mammogram for malignant neoplasm of breast: Secondary | ICD-10-CM

## 2013-06-17 ENCOUNTER — Ambulatory Visit
Admission: RE | Admit: 2013-06-17 | Discharge: 2013-06-17 | Disposition: A | Discharge status: Home / Self Care | Payer: Medicare Other | Source: Ambulatory Visit

## 2013-06-17 DIAGNOSIS — Z1231 Encounter for screening mammogram for malignant neoplasm of breast: Secondary | ICD-10-CM

## 2014-05-11 ENCOUNTER — Encounter: Payer: Self-pay | Admitting: Gastroenterology

## 2014-05-11 ENCOUNTER — Other Ambulatory Visit: Payer: Self-pay

## 2014-05-11 DIAGNOSIS — Z1231 Encounter for screening mammogram for malignant neoplasm of breast: Secondary | ICD-10-CM

## 2014-06-23 ENCOUNTER — Ambulatory Visit
Admission: RE | Admit: 2014-06-23 | Discharge: 2014-06-23 | Disposition: A | Discharge status: Home / Self Care | Payer: Commercial Managed Care - HMO | Source: Ambulatory Visit

## 2014-06-23 DIAGNOSIS — Z1231 Encounter for screening mammogram for malignant neoplasm of breast: Secondary | ICD-10-CM

## 2014-09-06 ENCOUNTER — Other Ambulatory Visit (HOSPITAL_COMMUNITY): Payer: Self-pay | Admitting: Internal Medicine

## 2014-09-06 ENCOUNTER — Ambulatory Visit (HOSPITAL_COMMUNITY)
Admission: RE | Admit: 2014-09-06 | Discharge: 2014-09-06 | Disposition: A | Discharge status: Home / Self Care | Payer: Commercial Managed Care - HMO | Source: Ambulatory Visit | Attending: Internal Medicine | Admitting: Internal Medicine

## 2014-09-06 DIAGNOSIS — R609 Edema, unspecified: Secondary | ICD-10-CM

## 2014-09-06 DIAGNOSIS — M7989 Other specified soft tissue disorders: Secondary | ICD-10-CM | POA: Diagnosis not present

## 2014-09-06 DIAGNOSIS — M1712 Unilateral primary osteoarthritis, left knee: Secondary | ICD-10-CM | POA: Diagnosis not present

## 2014-09-06 DIAGNOSIS — M25562 Pain in left knee: Secondary | ICD-10-CM | POA: Diagnosis not present

## 2014-11-11 ENCOUNTER — Observation Stay (HOSPITAL_COMMUNITY)
Admission: EM | Admit: 2014-11-11 | Discharge: 2014-11-12 | Disposition: A | Discharge status: Home / Self Care | Payer: Commercial Managed Care - HMO | Attending: Internal Medicine | Admitting: Internal Medicine

## 2014-11-11 ENCOUNTER — Encounter (HOSPITAL_COMMUNITY): Payer: Self-pay

## 2014-11-11 ENCOUNTER — Emergency Department (HOSPITAL_COMMUNITY): Payer: Commercial Managed Care - HMO

## 2014-11-11 DIAGNOSIS — E785 Hyperlipidemia, unspecified: Secondary | ICD-10-CM | POA: Diagnosis present

## 2014-11-11 DIAGNOSIS — Z7982 Long term (current) use of aspirin: Secondary | ICD-10-CM | POA: Diagnosis not present

## 2014-11-11 DIAGNOSIS — I129 Hypertensive chronic kidney disease with stage 1 through stage 4 chronic kidney disease, or unspecified chronic kidney disease: Secondary | ICD-10-CM | POA: Diagnosis not present

## 2014-11-11 DIAGNOSIS — E039 Hypothyroidism, unspecified: Secondary | ICD-10-CM | POA: Insufficient documentation

## 2014-11-11 DIAGNOSIS — N183 Chronic kidney disease, stage 3 unspecified: Secondary | ICD-10-CM | POA: Diagnosis present

## 2014-11-11 DIAGNOSIS — R079 Chest pain, unspecified: Secondary | ICD-10-CM | POA: Diagnosis not present

## 2014-11-11 DIAGNOSIS — I10 Essential (primary) hypertension: Secondary | ICD-10-CM | POA: Diagnosis not present

## 2014-11-11 DIAGNOSIS — E119 Type 2 diabetes mellitus without complications: Secondary | ICD-10-CM | POA: Diagnosis not present

## 2014-11-11 DIAGNOSIS — M8580 Other specified disorders of bone density and structure, unspecified site: Secondary | ICD-10-CM | POA: Insufficient documentation

## 2014-11-11 DIAGNOSIS — R072 Precordial pain: Secondary | ICD-10-CM | POA: Diagnosis not present

## 2014-11-11 LAB — BASIC METABOLIC PANEL
Anion gap: 8 (ref 5–15)
BUN: 18 mg/dL (ref 6–23)
CALCIUM: 9.7 mg/dL (ref 8.4–10.5)
CO2: 27 mmol/L (ref 19–32)
Chloride: 105 mmol/L (ref 96–112)
Creatinine, Ser: 1.39 mg/dL — ABNORMAL HIGH (ref 0.50–1.10)
GFR, EST AFRICAN AMERICAN: 40 mL/min — AB (ref >90)
GFR, EST NON AFRICAN AMERICAN: 34 mL/min — AB (ref >90)
GLUCOSE: 110 mg/dL — AB (ref 70–99)
Potassium: 4.7 mmol/L (ref 3.5–5.1)
Sodium: 140 mmol/L (ref 135–145)

## 2014-11-11 LAB — I-STAT TROPONIN, ED: TROPONIN I, POC: 0 ng/mL (ref 0.00–0.08)

## 2014-11-11 LAB — CBC
HCT: 36.4 % (ref 36.0–46.0)
Hemoglobin: 11.8 g/dL — ABNORMAL LOW (ref 12.0–15.0)
MCH: 29.4 pg (ref 26.0–34.0)
MCHC: 32.4 g/dL (ref 30.0–36.0)
MCV: 90.8 fL (ref 78.0–100.0)
Platelets: 251 10*3/uL (ref 150–400)
RBC: 4.01 MIL/uL (ref 3.87–5.11)
RDW: 12.1 % (ref 11.5–15.5)
WBC: 6.3 10*3/uL (ref 4.0–10.5)

## 2014-11-11 LAB — DIGOXIN LEVEL: DIGOXIN LVL: 1 ng/mL (ref 0.8–2.0)

## 2014-11-11 MED ORDER — ONDANSETRON HCL 4 MG/2ML IJ SOLN: 4.0000 mg | Freq: Three times a day (TID) | INTRAMUSCULAR | Status: DC | PRN

## 2014-11-11 MED ORDER — ACETAMINOPHEN 325 MG PO TABS: 650.0000 mg | ORAL_TABLET | ORAL | Status: DC | PRN

## 2014-11-11 MED ORDER — ONDANSETRON HCL 4 MG/2ML IJ SOLN: 4.0000 mg | Freq: Four times a day (QID) | INTRAMUSCULAR | Status: DC | PRN

## 2014-11-11 MED ORDER — DOCUSATE SODIUM 100 MG PO CAPS
100.0000 mg | ORAL_CAPSULE | Freq: Every day | ORAL | Status: DC | PRN
  Administered 2014-11-12: 100 mg via ORAL
  Filled 2014-11-11: qty 1

## 2014-11-11 MED ORDER — ASPIRIN EC 325 MG PO TBEC
325.0000 mg | DELAYED_RELEASE_TABLET | Freq: Every day | ORAL | Status: DC
Start: 2014-11-12 — End: 2014-11-12
  Administered 2014-11-12: 325 mg via ORAL
  Filled 2014-11-11: qty 1

## 2014-11-11 MED ORDER — MORPHINE SULFATE 2 MG/ML IJ SOLN: 2.0000 mg | INTRAMUSCULAR | Status: DC | PRN

## 2014-11-11 MED ORDER — LEVOTHYROXINE SODIUM 75 MCG PO TABS
75.0000 ug | ORAL_TABLET | Freq: Every day | ORAL | Status: DC
  Administered 2014-11-12: 75 ug via ORAL
  Filled 2014-11-11 (×2): qty 1

## 2014-11-11 MED ORDER — SIMVASTATIN 20 MG PO TABS
20.0000 mg | ORAL_TABLET | Freq: Every morning | ORAL | Status: DC
  Administered 2014-11-12: 20 mg via ORAL
  Filled 2014-11-11: qty 1

## 2014-11-11 MED ORDER — ENOXAPARIN SODIUM 40 MG/0.4ML ~~LOC~~ SOLN
40.0000 mg | SUBCUTANEOUS | Status: DC
  Administered 2014-11-12: 40 mg via SUBCUTANEOUS
  Filled 2014-11-11: qty 0.4

## 2014-11-11 MED ORDER — DIGOXIN 250 MCG PO TABS
250.0000 ug | ORAL_TABLET | Freq: Every day | ORAL | Status: DC
  Administered 2014-11-12: 250 ug via ORAL
  Filled 2014-11-11: qty 1

## 2014-11-11 MED ORDER — LOSARTAN POTASSIUM 50 MG PO TABS
100.0000 mg | ORAL_TABLET | Freq: Every day | ORAL | Status: DC
  Administered 2014-11-12: 100 mg via ORAL
  Filled 2014-11-11: qty 2

## 2014-11-11 NOTE — ED Provider Notes (Signed)
CSN: [id number]     Arrival date & time 11/11/14  1751 History   First MD Initiated Contact with Patient 11/11/14 2017     Chief Complaint  Patient presents with  . Chest Pain     (Consider location/radiation/quality/duration/timing/severity/associated sxs/prior Treatment) Patient is a 81 y.o. female presenting with chest pain. The history is provided by the patient.  Chest Pain Pain location:  Substernal area Pain quality: pressure   Pain radiates to:  Does not radiate Pain radiates to the back: no   Pain severity:  Severe Onset quality:  Gradual Duration:  3 hours Timing:  Constant Progression:  Resolved Chronicity:  New Context: at rest   Relieved by:  None tried Worsened by:  Nothing tried Ineffective treatments:  Aspirin Associated symptoms: no abdominal pain, no anorexia, no cough, no fever, no heartburn, no nausea, no shortness of breath and not vomiting   Risk factors: high cholesterol and hypertension   Risk factors: no coronary artery disease, no diabetes mellitus and no prior DVT/PE     Past Medical History  Diagnosis Date  . Diverticulosis of colon (without mention of hemorrhage)   . Personal history of colonic polyps 1999 & 2007    hyperplastic   . IBS (irritable bowel syndrome)   . Arrhythmia   . Hypothyroidism   . Neuropathy    Past Surgical History  Procedure Laterality Date  . Vaginal hysterectomy    . Abdominal hysterectomy    . Foot arthrodesis      pipj digits  2-4 left foot  . Tonsillectomy  06/26/2012    Procedure: TONSILLECTOMY;  Surgeon: Ascencion Dike, MD;  Location: [facility];  Service: ENT;  Laterality: Right;   Family History  Problem Relation Age of Onset  . CAD Neg Hx    History  Substance Use Topics  . Smoking status: Never Smoker   . Smokeless tobacco: Never Used  . Alcohol Use: No   OB History    No data available     Review of Systems  Constitutional: Negative for fever.  Respiratory: Negative for cough and shortness of  breath.   Cardiovascular: Positive for chest pain.  Gastrointestinal: Negative for heartburn, nausea, vomiting, abdominal pain and anorexia.  All other systems reviewed and are negative.     Allergies  Review of patient's allergies indicates no known allergies.  Home Medications   Prior to Admission medications   Medication Sig Start Date End Date Taking? Authorizing Provider  aspirin EC 81 MG tablet Take 81 mg by mouth daily.   Yes Historical Provider, MD  digoxin (LANOXIN) 0.25 MG tablet Take 250 mcg by mouth daily.     Yes Historical Provider, MD  levothyroxine (SYNTHROID, LEVOTHROID) 75 MCG tablet Take 75 mcg by mouth daily.     Yes Historical Provider, MD  losartan (COZAAR) 100 MG tablet Take 100 mg by mouth daily.   Yes Historical Provider, MD  Multiple Minerals-Vitamins (CALCIUM & VIT D3 BONE HEALTH PO) Take 1 tablet by mouth daily.   Yes Historical Provider, MD  Multiple Vitamins-Minerals (MULTIVITAMIN WITH MINERALS) tablet Take 1 tablet by mouth daily.   Yes Historical Provider, MD  simvastatin (ZOCOR) 20 MG tablet Take 20 mg by mouth every morning.    Yes Historical Provider, MD  oxyCODONE-acetaminophen (ROXICET) 5-325 MG/5ML solution Take 5 mLs by mouth every 4 (four) hours as needed for pain. 06/26/12   Leta Baptist, MD   BP 153/86 mmHg  Pulse 65  Temp(Src) 98.5 F (  36.9 C) (Oral)  Resp 24  SpO2 100% Physical Exam  Constitutional: She is oriented to person, place, and time. She appears well-developed and well-nourished. No distress.  Pleasant, very healthy appearing female in NAD  HENT:  Head: Normocephalic and atraumatic.  Mouth/Throat: No oropharyngeal exudate.  Eyes: EOM are normal. Pupils are equal, round, and reactive to light.  Neck: Normal range of motion. Neck supple.  Cardiovascular: Normal rate, regular rhythm and intact distal pulses.   Murmur (blowing systolic) heard. Pulmonary/Chest: Effort normal and breath sounds normal. No respiratory distress. She has  no wheezes. She has no rales.  Abdominal: Soft. Bowel sounds are normal. She exhibits no distension. There is no tenderness.  Neurological: She is alert and oriented to person, place, and time.  Skin: Skin is warm and dry. She is not diaphoretic.  Psychiatric: She has a normal mood and affect. Her behavior is normal. Judgment and thought content normal.  Nursing note and vitals reviewed.   ED Course  Procedures (including critical care time) Labs Review Labs Reviewed  CBC - Abnormal; Notable for the following:    Hemoglobin 11.8 (*)    All other components within normal limits  BASIC METABOLIC PANEL - Abnormal; Notable for the following:    Glucose, Bld 110 (*)    Creatinine, Ser 1.39 (*)    GFR calc non Af Amer 34 (*)    GFR calc Af Amer 40 (*)    All other components within normal limits  DIGOXIN LEVEL  I-STAT TROPOININ, ED    Imaging Review Dg Chest 2 View  11/11/2014   CLINICAL DATA:  Chest pain and pressure for 4 days. History of hypertension.  EXAM: CHEST  2 VIEW  COMPARISON:  None  FINDINGS: Hyperinflation. Mild vertebral body height loss within the upper thoracic spine. S-shaped thoracolumbar spine curvature. Midline trachea. Normal heart size and mediastinal contours for age. No pleural effusion or pneumothorax. Mild scarring at the medial left lung base.  IMPRESSION: Hyperinflation, without acute disease.  Osteopenia with a mild compression deformity in the upper thoracic spine. Indeterminate acuity.   Electronically Signed   By: Abigail Miyamoto M.D.   On: 11/11/2014 20:05     EKG Interpretation   Date/Time:  Thursday November 11 2014 18:24:40 EST Ventricular Rate:  118 PR Interval:  178 QRS Duration: 78 QT Interval:  312 QTC Calculation: 437 R Axis:   94 Text Interpretation:  Sinus tachycardia Right atrial enlargement Rightward  axis Pulmonary disease pattern Nonspecific ST abnormality Abnormal ECG No  significant change was found Confirmed by CAMPOS  MD, Lennette Bihari ([id number])  on  11/11/2014 8:50:12 PM      MDM   Final diagnoses:  Chest pain with high risk for cardiac etiology   Brittany Holden is a 81 y.o. female who presents with chest pressure. Multiple episodes since Monday. Attributed initially to some yard work but has occurred multiple times since then. Today, at rest, with severe chest pressure for about 3 hours. Took 324 ASA prior to arrival. Chest pain free in the lobby.  On my exam, AFVSS. Chest pain free. EKG with non-ischemic changes. CXR without consolidation, effusion, widened mediastinum, pneumothorax. Initial troponin negative. HEART score of 4. Digoxin level checked and normal. High risk based on history and risk factors. Will admit for provocative testing. Admitted to hospitalist.  Larence Penning, MD 11/11/14 7341  Jola Schmidt, MD 11/11/14 [phone]

## 2014-11-11 NOTE — H&P (Addendum)
[facility] History and Physical  Brittany Holden EXN:[id number] DOB: [birth year] DOA: 11/11/2014  Referring physician: ER physician. PCP: Delphina Cahill, MD   Chief Complaint: Chest pain.  HPI: Brittany Holden is a 81 y.o. female with history of hypertension, hyperlipidemia, hypothyroidism and arrhythmia presents to the ER because of chest pain. Patient states she has been having chest pain off and on for last 3-4 days. Pain is mostly retrosternal and nonradiating pressure-like last for a few minutes each time. But this evening patient chest pain became more severe and patient presented to the ER. While awaiting patient's chest pain completely resolved without any intervention. Patient denies any associated shortness of breath diaphoresis palpitations nausea vomiting abdominal pain fever chills or any productive cough. In the ER EKG showed nonspecific findings and chest x-ray was largely unremarkable. Cardiac markers were negative and patient has been admitted for further management.   Review of Systems: As presented in the history of presenting illness, rest negative.  Past Medical History  Diagnosis Date  . Diverticulosis of colon (without mention of hemorrhage)   . Personal history of colonic polyps 1999 & 2007    hyperplastic   . IBS (irritable bowel syndrome)   . Arrhythmia   . Hypothyroidism   . Neuropathy    Past Surgical History  Procedure Laterality Date  . Vaginal hysterectomy    . Abdominal hysterectomy    . Foot arthrodesis      pipj digits  2-4 left foot  . Tonsillectomy  06/26/2012    Procedure: TONSILLECTOMY;  Surgeon: Ascencion Dike, MD;  Location: [facility];  Service: ENT;  Laterality: Right;   Social History:  reports that she has never smoked. She has never used smokeless tobacco. She reports that she does not drink alcohol or use illicit drugs. Where does patient live at home. Can patient participate in ADLs? Yes.  No Known Allergies  Family History:  Family  History  Problem Relation Age of Onset  . CAD Neg Hx       Prior to Admission medications   Medication Sig Start Date End Date Taking? Authorizing Provider  aspirin EC 81 MG tablet Take 81 mg by mouth daily.   Yes Historical Provider, MD  digoxin (LANOXIN) 0.25 MG tablet Take 250 mcg by mouth daily.     Yes Historical Provider, MD  levothyroxine (SYNTHROID, LEVOTHROID) 75 MCG tablet Take 75 mcg by mouth daily.     Yes Historical Provider, MD  losartan (COZAAR) 100 MG tablet Take 100 mg by mouth daily.   Yes Historical Provider, MD  Multiple Minerals-Vitamins (CALCIUM & VIT D3 BONE HEALTH PO) Take 1 tablet by mouth daily.   Yes Historical Provider, MD  Multiple Vitamins-Minerals (MULTIVITAMIN WITH MINERALS) tablet Take 1 tablet by mouth daily.   Yes Historical Provider, MD  simvastatin (ZOCOR) 20 MG tablet Take 20 mg by mouth every morning.    Yes Historical Provider, MD  oxyCODONE-acetaminophen (ROXICET) 5-325 MG/5ML solution Take 5 mLs by mouth every 4 (four) hours as needed for pain. 06/26/12   Leta Baptist, MD    Physical Exam: Filed Vitals:   11/11/14 2145 11/11/14 2154 11/11/14 2215 11/11/14 2236  BP: 150/75  153/86 161/71  Pulse: 64  65 80  Temp:  98.5 F (36.9 C)  98.2 F (36.8 C)  TempSrc:  Oral  Oral  Resp: 13  24   SpO2: 99%  100% 100%     General:  Well-developed and nourished.  Eyes: Anicteric no  pallor.  ENT: No discharge from the ears eyes nose and mouth.  Neck: No mass felt.  Cardiovascular: S1-S2 heard.  Respiratory: No rhonchi or crepitations.  Abdomen: Soft nontender bowel sounds present.  Skin: No rash.  Musculoskeletal: No edema.  Psychiatric: Appears normal.  Neurologic: Alert awake oriented to time place and person. Moves all extremities.  Labs on Admission:  Basic Metabolic Panel:  Recent Labs Lab 11/11/14 1817  NA 140  K 4.7  CL 105  CO2 27  GLUCOSE 110*  BUN 18  CREATININE 1.39*  CALCIUM 9.7   Liver Function Tests: No results  for input(s): AST, ALT, ALKPHOS, BILITOT, PROT, ALBUMIN in the last 168 hours. No results for input(s): LIPASE, AMYLASE in the last 168 hours. No results for input(s): AMMONIA in the last 168 hours. CBC:  Recent Labs Lab 11/11/14 1817  WBC 6.3  HGB 11.8*  HCT 36.4  MCV 90.8  PLT 251   Cardiac Enzymes: No results for input(s): CKTOTAL, CKMB, CKMBINDEX, TROPONINI in the last 168 hours.  BNP (last 3 results) No results for input(s): BNP in the last 8760 hours.  ProBNP (last 3 results) No results for input(s): PROBNP in the last 8760 hours.  CBG: No results for input(s): GLUCAP in the last 168 hours.  Radiological Exams on Admission: Dg Chest 2 View  11/11/2014   CLINICAL DATA:  Chest pain and pressure for 4 days. History of hypertension.  EXAM: CHEST  2 VIEW  COMPARISON:  None  FINDINGS: Hyperinflation. Mild vertebral body height loss within the upper thoracic spine. S-shaped thoracolumbar spine curvature. Midline trachea. Normal heart size and mediastinal contours for age. No pleural effusion or pneumothorax. Mild scarring at the medial left lung base.  IMPRESSION: Hyperinflation, without acute disease.  Osteopenia with a mild compression deformity in the upper thoracic spine. Indeterminate acuity.   Electronically Signed   By: Abigail Miyamoto M.D.   On: 11/11/2014 20:05    EKG: Independently reviewed. Patient had 2 EKG first one showing normal sinus rhythm with nonspecific ST-T changes and second one was showing sinus tachycardia.  Assessment/Plan Principal Problem:   Chest pain Active Problems:   CKD (chronic kidney disease) stage 3, GFR 30-59 ml/min   Essential hypertension   Hyperlipidemia   1. Chest pain - at this time patient is chest pain-free. Given the risk factors including hypertension hyperlipidemia and age we will cycle cardiac markers keep patient nothing by mouth in a.m. in anticipation of possible cardiac markers check 2-D echo and aspirin. 2. Hypertension -  continue present medications. 3. Hyperlipidemia - continue statins. 4. Hypothyroidism - continue Synthroid. 5. History of arrhythmia on digoxin. Patient states she has been taking digoxin for many years now. 6. Mild anemia - follow CBC and further workup as outpatient. 7. Mild compression deformity in the upper thoracic spine per the chest x-ray. 8. Chronic kidney disease stage III - follow metabolic panel.   DVT Prophylaxis Lovenox.  Code Status: Full code.  Family Communication: Patient's family at the bedside.  Disposition Plan: Admit for observation.    KAKRAKANDY,ARSHAD N. [facility] Pager [phone].  If 7PM-7AM, please contact night-coverage [email or web address] Password [facility] 11/11/2014, 10:38 PM

## 2014-11-11 NOTE — ED Notes (Signed)
Pt reporting chest pain since Monday.  Sts it was mild then and has progressively gotten worse.  Sts it got bad about an hr ago and she took 324 ASA with no pain relief.  Pt with no cardiac hx.

## 2014-11-12 DIAGNOSIS — R079 Chest pain, unspecified: Secondary | ICD-10-CM

## 2014-11-12 DIAGNOSIS — Z7982 Long term (current) use of aspirin: Secondary | ICD-10-CM | POA: Diagnosis not present

## 2014-11-12 DIAGNOSIS — I129 Hypertensive chronic kidney disease with stage 1 through stage 4 chronic kidney disease, or unspecified chronic kidney disease: Secondary | ICD-10-CM | POA: Diagnosis not present

## 2014-11-12 DIAGNOSIS — M8580 Other specified disorders of bone density and structure, unspecified site: Secondary | ICD-10-CM | POA: Diagnosis not present

## 2014-11-12 DIAGNOSIS — N183 Chronic kidney disease, stage 3 (moderate): Secondary | ICD-10-CM | POA: Diagnosis not present

## 2014-11-12 DIAGNOSIS — E785 Hyperlipidemia, unspecified: Secondary | ICD-10-CM | POA: Diagnosis not present

## 2014-11-12 DIAGNOSIS — E039 Hypothyroidism, unspecified: Secondary | ICD-10-CM | POA: Diagnosis not present

## 2014-11-12 LAB — COMPREHENSIVE METABOLIC PANEL
ALBUMIN: 3.5 g/dL (ref 3.5–5.2)
ALT: 15 U/L (ref 0–35)
AST: 21 U/L (ref 0–37)
Alkaline Phosphatase: 38 U/L — ABNORMAL LOW (ref 39–117)
Anion gap: 8 (ref 5–15)
BILIRUBIN TOTAL: 0.7 mg/dL (ref 0.3–1.2)
BUN: 15 mg/dL (ref 6–23)
CO2: 28 mmol/L (ref 19–32)
CREATININE: 1.3 mg/dL — AB (ref 0.50–1.10)
Calcium: 9.2 mg/dL (ref 8.4–10.5)
Chloride: 103 mmol/L (ref 96–112)
GFR calc Af Amer: 43 mL/min — ABNORMAL LOW (ref >90)
GFR calc non Af Amer: 37 mL/min — ABNORMAL LOW (ref >90)
Glucose, Bld: 99 mg/dL (ref 70–99)
POTASSIUM: 4.6 mmol/L (ref 3.5–5.1)
SODIUM: 139 mmol/L (ref 135–145)
Total Protein: 6.1 g/dL (ref 6.0–8.3)

## 2014-11-12 LAB — TROPONIN I
TROPONIN I: 0.03 ng/mL (ref <0.031)
Troponin I: 0.03 ng/mL (ref <0.031)
Troponin I: 0.03 ng/mL (ref <0.031)

## 2014-11-12 LAB — CBC WITH DIFFERENTIAL/PLATELET
BASOS PCT: 1 % (ref 0–1)
Basophils Absolute: 0.1 10*3/uL (ref 0.0–0.1)
EOS ABS: 0.2 10*3/uL (ref 0.0–0.7)
EOS PCT: 4 % (ref 0–5)
HCT: 34.9 % — ABNORMAL LOW (ref 36.0–46.0)
Hemoglobin: 11.3 g/dL — ABNORMAL LOW (ref 12.0–15.0)
LYMPHS ABS: 2.1 10*3/uL (ref 0.7–4.0)
Lymphocytes Relative: 40 % (ref 12–46)
MCH: 29.4 pg (ref 26.0–34.0)
MCHC: 32.4 g/dL (ref 30.0–36.0)
MCV: 90.9 fL (ref 78.0–100.0)
MONOS PCT: 9 % (ref 3–12)
Monocytes Absolute: 0.5 10*3/uL (ref 0.1–1.0)
Neutro Abs: 2.5 10*3/uL (ref 1.7–7.7)
Neutrophils Relative %: 46 % (ref 43–77)
Platelets: 230 10*3/uL (ref 150–400)
RBC: 3.84 MIL/uL — AB (ref 3.87–5.11)
RDW: 12.2 % (ref 11.5–15.5)
WBC: 5.3 10*3/uL (ref 4.0–10.5)

## 2014-11-12 MED ORDER — SALINE SPRAY 0.65 % NA SOLN
1.0000 | Freq: Three times a day (TID) | NASAL | Status: DC | PRN
  Filled 2014-11-12 (×2): qty 44

## 2014-11-12 NOTE — Progress Notes (Signed)
UR completed 

## 2014-11-12 NOTE — Discharge Summary (Signed)
[facility]  Physician Discharge Summary   Patient ID: Brittany Holden MRN: [id number] DOB/AGE: [birth year] 81 y.o.  Admit date: 11/11/2014 Discharge date: 11/12/2014  PCP: Delphina Cahill, MD  DISCHARGE DIAGNOSES:  Principal Problem:   Chest pain Active Problems:   CKD (chronic kidney disease) stage 3, GFR 30-59 ml/min   Essential hypertension   Hyperlipidemia   RECOMMENDATIONS FOR OUTPATIENT FOLLOW UP: 1. Follow-up with PCP for further management of chest pain  DISCHARGE CONDITION: fair  Diet recommendation: Heart healthy  Filed Weights   11/11/14 2236  Weight: 61.1 kg (134 lb 11.2 oz)    INITIAL HISTORY: 81 year old Caucasian female with a past medical history of hypertension, hyperlipidemia, presented to the ED complaining of chest pain. Has been on and off for the last 3-4 days prior to admission. She was admitted to the hospital for further management.  Consultations:  None  Procedures: 2-D echocardiogram Study Conclusions - Left ventricle: The cavity size was normal. Wall thickness wasnormal. Systolic function was normal. The estimated ejectionfraction was in the range of 60% to 65%. Wall motion was normal;there were no regional wall motion abnormalities. Dopplerparameters are consistent with abnormal left ventricularrelaxation (grade 1 diastolic dysfunction). - Aortic valve: Trileaflet; moderately calcified leaflets. Therewas mild stenosis. Mean gradient (S): 10 mm Hg. Valve area(Vmean): 1.99 cm^2. - Mitral valve: Mildly calcified annulus. Mildly calcified leaflets. There was no significant regurgitation. - Right ventricle: The cavity size was normal. Systolic functionwas normal. - Pulmonary arteries: PA peak pressure: 30 mm Hg (S). - Inferior vena cava: The vessel was normal in size. Therespirophasic diameter changes were in the normal range (>= 50%),consistent with normal central venous pressure. Impressions: - Normal LV size and systolic  function, EF 35-46%. Normal RV sizeand systolic function. Mild aortic stenosis.  HOSPITAL COURSE:   Chest pain. Patient was admitted to the hospital. Serial troponins were normal. She was chest pain-free even at the time of hospitalization. She was kept on aspirin. EKG did not show any acute ischemic changes. She did not have any recurrence of her symptoms. Echocardiogram does not show any wall motion abnormalities and has a normal ejection fraction. Patient will be discharged today with aspirin. She'll be asked to follow-up with her primary care physician early next week for further discussions regarding this issue. She may benefit from an outpatient stress test.  Her other medical problems including hypertension, hyperlipidemia, hypothyroidism remained stable.  She has a history of unspecified arrhythmia for which she is on digoxin. Digoxin Level was normal.  She also has mild anemia, thoracic compression deformity and chronic kidney disease stage III, all of which is stable.  She was discharged in stable condition.   PERTINENT LABS:  The results of significant diagnostics from this hospitalization (including imaging, microbiology, ancillary and laboratory) are listed below for reference.    Labs: Basic Metabolic Panel:  Recent Labs Lab 11/11/14 1817 11/12/14 0404  NA 140 139  K 4.7 4.6  CL 105 103  CO2 27 28  GLUCOSE 110* 99  BUN 18 15  CREATININE 1.39* 1.30*  CALCIUM 9.7 9.2   Liver Function Tests:  Recent Labs Lab 11/12/14 0404  AST 21  ALT 15  ALKPHOS 38*  BILITOT 0.7  PROT 6.1  ALBUMIN 3.5   CBC:  Recent Labs Lab 11/11/14 1817 11/12/14 0404  WBC 6.3 5.3  NEUTROABS  --  2.5  HGB 11.8* 11.3*  HCT 36.4 34.9*  MCV 90.8 90.9  PLT 251 230   Cardiac Enzymes:  Recent  Labs Lab 11/12/14 0051 11/12/14 0404 11/12/14 1120  TROPONINI 0.03 0.03 0.03   IMAGING STUDIES Dg Chest 2 View  11/11/2014   CLINICAL DATA:  Chest pain and pressure for 4 days.  History of hypertension.  EXAM: CHEST  2 VIEW  COMPARISON:  None  FINDINGS: Hyperinflation. Mild vertebral body height loss within the upper thoracic spine. S-shaped thoracolumbar spine curvature. Midline trachea. Normal heart size and mediastinal contours for age. No pleural effusion or pneumothorax. Mild scarring at the medial left lung base.  IMPRESSION: Hyperinflation, without acute disease.  Osteopenia with a mild compression deformity in the upper thoracic spine. Indeterminate acuity.   Electronically Signed   By: Abigail Miyamoto M.D.   On: 11/11/2014 20:05    DISCHARGE EXAMINATION: Filed Vitals:   11/11/14 2154 11/11/14 2215 11/11/14 2236 11/12/14 0401  BP:  153/86 161/71 135/52  Pulse:  65 80 69  Temp: 98.5 F (36.9 C)  98.2 F (36.8 C) 97.8 F (36.6 C)  TempSrc: Oral  Oral Oral  Resp:  24  20  Height:   6' (1.829 m)   Weight:   61.1 kg (134 lb 11.2 oz)   SpO2:  100% 100% 98%   General appearance: alert, cooperative, appears stated age and no distress Resp: clear to auscultation bilaterally Cardio: regular rate and rhythm, S1, S2 normal, no murmur, click, rub or gallop GI: soft, non-tender; bowel sounds normal; no masses,  no organomegaly Neurologic: Alert and oriented X 3, normal strength and tone. Normal symmetric reflexes. Normal coordination and gait  DISPOSITION: Home  Discharge Instructions    Call MD for:  difficulty breathing, headache or visual disturbances    Complete by:  As directed      Call MD for:  extreme fatigue    Complete by:  As directed      Call MD for:  persistant dizziness or light-headedness    Complete by:  As directed      Call MD for:  severe uncontrolled pain    Complete by:  As directed      Diet - low sodium heart healthy    Complete by:  As directed      Discharge instructions    Complete by:  As directed   Please see Dr. Nevada Crane next week. Seek attention sooner if you develop chest pain, shortness of breath, lightheadedness.     Increase  activity slowly    Complete by:  As directed            ALLERGIES: No Known Allergies   Discharge Medication List as of 11/12/2014  4:25 PM    CONTINUE these medications which have NOT CHANGED   Details  aspirin EC 81 MG tablet Take 81 mg by mouth daily., Until Discontinued, Historical Med    digoxin (LANOXIN) 0.25 MG tablet Take 250 mcg by mouth daily.  , Until Discontinued, Historical Med    levothyroxine (SYNTHROID, LEVOTHROID) 75 MCG tablet Take 75 mcg by mouth daily.  , Until Discontinued, Historical Med    losartan (COZAAR) 100 MG tablet Take 100 mg by mouth daily., Until Discontinued, Historical Med    Multiple Minerals-Vitamins (CALCIUM & VIT D3 BONE HEALTH PO) Take 1 tablet by mouth daily., Until Discontinued, Historical Med    Multiple Vitamins-Minerals (MULTIVITAMIN WITH MINERALS) tablet Take 1 tablet by mouth daily., Until Discontinued, Historical Med    oxyCODONE-acetaminophen (ROXICET) 5-325 MG/5ML solution Take 5 mLs by mouth every 4 (four) hours as needed for pain., Starting 06/26/2012,  Until Discontinued, Print    simvastatin (ZOCOR) 20 MG tablet Take 20 mg by mouth every morning. , Until Discontinued, Historical Med       Follow-up Information    Follow up with Delphina Cahill, MD.   Specialty:  Internal Medicine   Why:  post hospitalization follow up and to consider further work up for chest pain.   Contact informationLinna Hoff [place] [id number]       TOTAL DISCHARGE TIME: 35 minutes  [facility] Pager [phone]  11/12/2014, 4:58 PM

## 2014-11-12 NOTE — Progress Notes (Addendum)
Patient echo results in chart, MD paged and order for discharge signed. Reviewed with patient and family medications-no new meds, resume home meds and discharge instructions. All questions answered. Tele off-CCMD notified), IV out-clean dry and intact, dressing placed. Patient family took out via w/c with all belongings in hand.

## 2014-11-12 NOTE — Discharge Instructions (Signed)

## 2014-11-12 NOTE — Progress Notes (Signed)
  Echocardiogram 2D Echocardiogram has been performed.  Brittany Holden 11/12/2014, 10:36 AM

## 2014-11-17 DIAGNOSIS — R079 Chest pain, unspecified: Secondary | ICD-10-CM | POA: Diagnosis not present

## 2014-11-17 DIAGNOSIS — E782 Mixed hyperlipidemia: Secondary | ICD-10-CM | POA: Diagnosis not present

## 2014-11-17 DIAGNOSIS — R944 Abnormal results of kidney function studies: Secondary | ICD-10-CM | POA: Diagnosis not present

## 2014-11-17 DIAGNOSIS — E039 Hypothyroidism, unspecified: Secondary | ICD-10-CM | POA: Diagnosis not present

## 2015-02-14 DIAGNOSIS — I10 Essential (primary) hypertension: Secondary | ICD-10-CM | POA: Diagnosis not present

## 2015-02-14 DIAGNOSIS — R944 Abnormal results of kidney function studies: Secondary | ICD-10-CM | POA: Diagnosis not present

## 2015-02-14 DIAGNOSIS — E039 Hypothyroidism, unspecified: Secondary | ICD-10-CM | POA: Diagnosis not present

## 2015-02-14 DIAGNOSIS — E782 Mixed hyperlipidemia: Secondary | ICD-10-CM | POA: Diagnosis not present

## 2015-02-16 DIAGNOSIS — R944 Abnormal results of kidney function studies: Secondary | ICD-10-CM | POA: Diagnosis not present

## 2015-02-16 DIAGNOSIS — E039 Hypothyroidism, unspecified: Secondary | ICD-10-CM | POA: Diagnosis not present

## 2015-02-16 DIAGNOSIS — E782 Mixed hyperlipidemia: Secondary | ICD-10-CM | POA: Diagnosis not present

## 2015-02-21 DIAGNOSIS — L821 Other seborrheic keratosis: Secondary | ICD-10-CM | POA: Diagnosis not present

## 2015-02-21 DIAGNOSIS — D225 Melanocytic nevi of trunk: Secondary | ICD-10-CM | POA: Diagnosis not present

## 2015-03-09 DIAGNOSIS — E875 Hyperkalemia: Secondary | ICD-10-CM | POA: Diagnosis not present

## 2015-03-16 DIAGNOSIS — E875 Hyperkalemia: Secondary | ICD-10-CM | POA: Diagnosis not present

## 2015-03-16 DIAGNOSIS — R944 Abnormal results of kidney function studies: Secondary | ICD-10-CM | POA: Diagnosis not present

## 2015-03-18 ENCOUNTER — Ambulatory Visit: Payer: Self-pay

## 2015-03-18 ENCOUNTER — Encounter: Payer: Self-pay | Admitting: Podiatry

## 2015-03-18 ENCOUNTER — Ambulatory Visit (INDEPENDENT_AMBULATORY_CARE_PROVIDER_SITE_OTHER): Payer: Commercial Managed Care - HMO | Admitting: Podiatry

## 2015-03-18 VITALS — BP 144/72 | HR 66 | Temp 99.2°F | Resp 14

## 2015-03-18 DIAGNOSIS — M79671 Pain in right foot: Secondary | ICD-10-CM

## 2015-03-18 DIAGNOSIS — M2041 Other hammer toe(s) (acquired), right foot: Secondary | ICD-10-CM

## 2015-03-18 NOTE — Progress Notes (Addendum)
Subjective:     Patient ID: Brittany Holden, female   DOB: 1929/10/30, 81 y.o.   MRN: [id number]  HPI patient states I'm having a lot of pain in my third and fourth toes right foot and it's been hurting me for over a year. I had my left toes fixed her proximally 4 years ago and those did well and I need the same procedure due to the pain. Patient states she's tried wider shoes she's tried soaks she's tried padding these areas without relief and has been referred by her family physician for correction of deformity   Review of Systems  All other systems reviewed and are negative.      Objective:   Physical Exam  Constitutional: She is oriented to person, place, and time.  Cardiovascular: Intact distal pulses.   Musculoskeletal: Normal range of motion.  Neurological: She is oriented to person, place, and time.  Skin: Skin is warm.  Nursing note and vitals reviewed.  neurovascular status intact muscle strength adequate with range of motion subtalar midtarsal joint within normal limits. Patient's noted to have good digital perfusion is well oriented 3 and has incision sites on the third and fourth toes of the left foot which of healed well and is noted on the right foot to have severe arthritis in the distal interphalangeal joints of digits 3 and 4 with rigid contracture and pain when I pressed into the interphalangeal joint with advanced arthritis noted.     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 X-ray report indicates that there is arthritis of the third and fourth toes right foot with narrowing of the joint surface and contracture at the joint. At this time I reviewed this with her and discussed procedure and she wants to have this fixed and since she is oriented the other foot done she wants to review consent form  today area I allowed her to review consent form reviewing alternative treatments and complications associated with distal arthroplasty digits 34 right and after review she signed. She is scheduled for outpatient surgery in the next 5 weeks and will call with any questions prior to procedure     Xray report indicates that there is enlargement of the middle plalanx of the 3 and 4th toe of the right foot with narrowing of the joint surface

## 2015-03-18 NOTE — Progress Notes (Signed)
   Subjective:    Patient ID: Brittany Holden, female    DOB: May 23, 1930, 81 y.o.   MRN: [id number]  HPI  Patient presents here today with the right 3rd and 4th toes are bending or folding over and stiff. The problem has developed over a few months. She has not treated it with anything just sometimes wearing tennis shoes which seems to help.   Review of Systems  All other systems reviewed and are negative.      Objective:   Physical Exam        Assessment & Plan:

## 2015-03-28 ENCOUNTER — Telehealth: Payer: Self-pay | Admitting: *Deleted

## 2015-03-28 NOTE — Telephone Encounter (Signed)
"  I'm scheduled for surgery on 05/03/2015.  I need to move that up a week.  My son isn't able to take me on the 30th."  We can reschedule you to 04/26/2015.  "That will be great, thank you."

## 2015-04-12 ENCOUNTER — Telehealth: Payer: Self-pay | Admitting: *Deleted

## 2015-04-12 NOTE — Telephone Encounter (Signed)
"  I called early this morning and nobody has returned my call.  It's now a quarter to 1.  I got an appointment to have my foot operated on, on August 23.  I want to cancel.  I'm going to be out of the house a while."  I left her a message that I will cancel the appointment.  I called and canceled surgery at the surgical center.

## 2015-05-02 ENCOUNTER — Encounter: Payer: Self-pay | Admitting: Podiatry

## 2015-05-06 ENCOUNTER — Other Ambulatory Visit: Payer: Self-pay

## 2015-05-06 DIAGNOSIS — Z1231 Encounter for screening mammogram for malignant neoplasm of breast: Secondary | ICD-10-CM

## 2015-05-12 DIAGNOSIS — L820 Inflamed seborrheic keratosis: Secondary | ICD-10-CM | POA: Diagnosis not present

## 2015-05-25 DIAGNOSIS — Z23 Encounter for immunization: Secondary | ICD-10-CM | POA: Diagnosis not present

## 2015-06-27 ENCOUNTER — Ambulatory Visit
Admission: RE | Admit: 2015-06-27 | Discharge: 2015-06-27 | Disposition: A | Discharge status: Home / Self Care | Payer: PRIVATE HEALTH INSURANCE | Source: Ambulatory Visit

## 2015-06-27 DIAGNOSIS — Z1231 Encounter for screening mammogram for malignant neoplasm of breast: Secondary | ICD-10-CM | POA: Diagnosis not present

## 2015-07-01 DIAGNOSIS — H5210 Myopia, unspecified eye: Secondary | ICD-10-CM | POA: Diagnosis not present

## 2015-07-01 DIAGNOSIS — I10 Essential (primary) hypertension: Secondary | ICD-10-CM | POA: Diagnosis not present

## 2015-07-01 DIAGNOSIS — H5200 Hypermetropia, unspecified eye: Secondary | ICD-10-CM | POA: Diagnosis not present

## 2015-07-01 DIAGNOSIS — H2510 Age-related nuclear cataract, unspecified eye: Secondary | ICD-10-CM | POA: Diagnosis not present

## 2015-07-19 DIAGNOSIS — E782 Mixed hyperlipidemia: Secondary | ICD-10-CM | POA: Diagnosis not present

## 2015-07-19 DIAGNOSIS — E039 Hypothyroidism, unspecified: Secondary | ICD-10-CM | POA: Diagnosis not present

## 2015-07-26 DIAGNOSIS — J309 Allergic rhinitis, unspecified: Secondary | ICD-10-CM | POA: Diagnosis not present

## 2015-07-26 DIAGNOSIS — E782 Mixed hyperlipidemia: Secondary | ICD-10-CM | POA: Diagnosis not present

## 2015-07-26 DIAGNOSIS — R944 Abnormal results of kidney function studies: Secondary | ICD-10-CM | POA: Diagnosis not present

## 2015-07-26 DIAGNOSIS — E039 Hypothyroidism, unspecified: Secondary | ICD-10-CM | POA: Diagnosis not present

## 2015-07-26 DIAGNOSIS — E875 Hyperkalemia: Secondary | ICD-10-CM | POA: Diagnosis not present

## 2015-08-23 DIAGNOSIS — E875 Hyperkalemia: Secondary | ICD-10-CM | POA: Diagnosis not present

## 2015-08-23 DIAGNOSIS — R944 Abnormal results of kidney function studies: Secondary | ICD-10-CM | POA: Diagnosis not present

## 2016-01-04 DIAGNOSIS — E782 Mixed hyperlipidemia: Secondary | ICD-10-CM | POA: Diagnosis not present

## 2016-01-04 DIAGNOSIS — E039 Hypothyroidism, unspecified: Secondary | ICD-10-CM | POA: Diagnosis not present

## 2016-01-06 DIAGNOSIS — E039 Hypothyroidism, unspecified: Secondary | ICD-10-CM | POA: Diagnosis not present

## 2016-01-06 DIAGNOSIS — E782 Mixed hyperlipidemia: Secondary | ICD-10-CM | POA: Diagnosis not present

## 2016-01-06 DIAGNOSIS — I10 Essential (primary) hypertension: Secondary | ICD-10-CM | POA: Diagnosis not present

## 2016-01-06 DIAGNOSIS — E875 Hyperkalemia: Secondary | ICD-10-CM | POA: Diagnosis not present

## 2016-01-19 DIAGNOSIS — E875 Hyperkalemia: Secondary | ICD-10-CM | POA: Diagnosis not present

## 2016-01-23 DIAGNOSIS — E875 Hyperkalemia: Secondary | ICD-10-CM | POA: Diagnosis not present

## 2016-01-23 DIAGNOSIS — R944 Abnormal results of kidney function studies: Secondary | ICD-10-CM | POA: Diagnosis not present

## 2016-01-23 DIAGNOSIS — I10 Essential (primary) hypertension: Secondary | ICD-10-CM | POA: Diagnosis not present

## 2016-04-12 DIAGNOSIS — L570 Actinic keratosis: Secondary | ICD-10-CM | POA: Diagnosis not present

## 2016-04-12 DIAGNOSIS — L821 Other seborrheic keratosis: Secondary | ICD-10-CM | POA: Diagnosis not present

## 2016-04-30 DIAGNOSIS — L728 Other follicular cysts of the skin and subcutaneous tissue: Secondary | ICD-10-CM | POA: Diagnosis not present

## 2016-04-30 DIAGNOSIS — L821 Other seborrheic keratosis: Secondary | ICD-10-CM | POA: Diagnosis not present

## 2016-04-30 DIAGNOSIS — L570 Actinic keratosis: Secondary | ICD-10-CM | POA: Diagnosis not present

## 2016-06-01 ENCOUNTER — Other Ambulatory Visit: Payer: Self-pay | Admitting: Internal Medicine

## 2016-06-01 DIAGNOSIS — Z1231 Encounter for screening mammogram for malignant neoplasm of breast: Secondary | ICD-10-CM

## 2016-06-04 DIAGNOSIS — Z23 Encounter for immunization: Secondary | ICD-10-CM | POA: Diagnosis not present

## 2016-06-28 ENCOUNTER — Ambulatory Visit
Admission: RE | Admit: 2016-06-28 | Discharge: 2016-06-28 | Disposition: A | Discharge status: Home / Self Care | Payer: PRIVATE HEALTH INSURANCE | Source: Ambulatory Visit | Attending: Internal Medicine | Admitting: Internal Medicine

## 2016-06-28 DIAGNOSIS — Z1231 Encounter for screening mammogram for malignant neoplasm of breast: Secondary | ICD-10-CM | POA: Diagnosis not present

## 2016-07-11 DIAGNOSIS — E039 Hypothyroidism, unspecified: Secondary | ICD-10-CM | POA: Diagnosis not present

## 2016-07-11 DIAGNOSIS — I10 Essential (primary) hypertension: Secondary | ICD-10-CM | POA: Diagnosis not present

## 2016-07-13 DIAGNOSIS — Z681 Body mass index (BMI) 19 or less, adult: Secondary | ICD-10-CM | POA: Diagnosis not present

## 2016-07-13 DIAGNOSIS — Z0001 Encounter for general adult medical examination with abnormal findings: Secondary | ICD-10-CM | POA: Diagnosis not present

## 2016-07-13 DIAGNOSIS — E875 Hyperkalemia: Secondary | ICD-10-CM | POA: Diagnosis not present

## 2016-07-13 DIAGNOSIS — E782 Mixed hyperlipidemia: Secondary | ICD-10-CM | POA: Diagnosis not present

## 2016-07-13 DIAGNOSIS — I10 Essential (primary) hypertension: Secondary | ICD-10-CM | POA: Diagnosis not present

## 2016-07-13 DIAGNOSIS — E039 Hypothyroidism, unspecified: Secondary | ICD-10-CM | POA: Diagnosis not present

## 2016-07-13 DIAGNOSIS — R944 Abnormal results of kidney function studies: Secondary | ICD-10-CM | POA: Diagnosis not present

## 2017-01-08 DIAGNOSIS — E039 Hypothyroidism, unspecified: Secondary | ICD-10-CM | POA: Diagnosis not present

## 2017-01-08 DIAGNOSIS — E782 Mixed hyperlipidemia: Secondary | ICD-10-CM | POA: Diagnosis not present

## 2017-01-10 DIAGNOSIS — Z681 Body mass index (BMI) 19 or less, adult: Secondary | ICD-10-CM | POA: Diagnosis not present

## 2017-01-10 DIAGNOSIS — E875 Hyperkalemia: Secondary | ICD-10-CM | POA: Diagnosis not present

## 2017-01-10 DIAGNOSIS — I10 Essential (primary) hypertension: Secondary | ICD-10-CM | POA: Diagnosis not present

## 2017-01-10 DIAGNOSIS — R944 Abnormal results of kidney function studies: Secondary | ICD-10-CM | POA: Diagnosis not present

## 2017-01-10 DIAGNOSIS — E039 Hypothyroidism, unspecified: Secondary | ICD-10-CM | POA: Diagnosis not present

## 2017-01-10 DIAGNOSIS — E782 Mixed hyperlipidemia: Secondary | ICD-10-CM | POA: Diagnosis not present

## 2017-05-28 ENCOUNTER — Other Ambulatory Visit: Payer: Self-pay | Admitting: Internal Medicine

## 2017-05-28 DIAGNOSIS — Z1239 Encounter for other screening for malignant neoplasm of breast: Secondary | ICD-10-CM

## 2017-06-04 DIAGNOSIS — Z23 Encounter for immunization: Secondary | ICD-10-CM | POA: Diagnosis not present

## 2017-07-02 ENCOUNTER — Ambulatory Visit
Admission: RE | Admit: 2017-07-02 | Discharge: 2017-07-02 | Disposition: A | Discharge status: Home / Self Care | Payer: Medicare HMO | Source: Ambulatory Visit | Attending: Internal Medicine | Admitting: Internal Medicine

## 2017-07-02 DIAGNOSIS — Z1231 Encounter for screening mammogram for malignant neoplasm of breast: Secondary | ICD-10-CM | POA: Diagnosis not present

## 2017-07-02 DIAGNOSIS — Z1239 Encounter for other screening for malignant neoplasm of breast: Secondary | ICD-10-CM

## 2017-07-12 DIAGNOSIS — E785 Hyperlipidemia, unspecified: Secondary | ICD-10-CM | POA: Diagnosis not present

## 2017-07-12 DIAGNOSIS — E039 Hypothyroidism, unspecified: Secondary | ICD-10-CM | POA: Diagnosis not present

## 2017-07-12 DIAGNOSIS — I10 Essential (primary) hypertension: Secondary | ICD-10-CM | POA: Diagnosis not present

## 2017-07-16 DIAGNOSIS — E039 Hypothyroidism, unspecified: Secondary | ICD-10-CM | POA: Diagnosis not present

## 2017-07-16 DIAGNOSIS — E782 Mixed hyperlipidemia: Secondary | ICD-10-CM | POA: Diagnosis not present

## 2017-07-16 DIAGNOSIS — I10 Essential (primary) hypertension: Secondary | ICD-10-CM | POA: Diagnosis not present

## 2017-07-16 DIAGNOSIS — E875 Hyperkalemia: Secondary | ICD-10-CM | POA: Diagnosis not present

## 2017-07-16 DIAGNOSIS — R944 Abnormal results of kidney function studies: Secondary | ICD-10-CM | POA: Diagnosis not present

## 2017-07-16 DIAGNOSIS — Z681 Body mass index (BMI) 19 or less, adult: Secondary | ICD-10-CM | POA: Diagnosis not present

## 2017-09-17 DIAGNOSIS — E039 Hypothyroidism, unspecified: Secondary | ICD-10-CM | POA: Diagnosis not present

## 2017-09-17 DIAGNOSIS — Z0000 Encounter for general adult medical examination without abnormal findings: Secondary | ICD-10-CM | POA: Diagnosis not present

## 2017-09-17 DIAGNOSIS — R000 Tachycardia, unspecified: Secondary | ICD-10-CM | POA: Diagnosis not present

## 2017-09-17 DIAGNOSIS — I10 Essential (primary) hypertension: Secondary | ICD-10-CM | POA: Diagnosis not present

## 2017-09-17 DIAGNOSIS — E785 Hyperlipidemia, unspecified: Secondary | ICD-10-CM | POA: Diagnosis not present

## 2017-09-17 DIAGNOSIS — Z681 Body mass index (BMI) 19 or less, adult: Secondary | ICD-10-CM | POA: Diagnosis not present

## 2017-09-17 DIAGNOSIS — E782 Mixed hyperlipidemia: Secondary | ICD-10-CM | POA: Diagnosis not present

## 2017-09-17 DIAGNOSIS — Z5181 Encounter for therapeutic drug level monitoring: Secondary | ICD-10-CM | POA: Diagnosis not present

## 2017-09-17 DIAGNOSIS — R944 Abnormal results of kidney function studies: Secondary | ICD-10-CM | POA: Diagnosis not present

## 2017-10-02 DIAGNOSIS — J309 Allergic rhinitis, unspecified: Secondary | ICD-10-CM | POA: Diagnosis not present

## 2017-11-06 DIAGNOSIS — E785 Hyperlipidemia, unspecified: Secondary | ICD-10-CM | POA: Diagnosis not present

## 2017-11-06 DIAGNOSIS — R000 Tachycardia, unspecified: Secondary | ICD-10-CM | POA: Diagnosis not present

## 2017-11-06 DIAGNOSIS — R944 Abnormal results of kidney function studies: Secondary | ICD-10-CM | POA: Diagnosis not present

## 2017-11-06 DIAGNOSIS — E782 Mixed hyperlipidemia: Secondary | ICD-10-CM | POA: Diagnosis not present

## 2017-11-06 DIAGNOSIS — E039 Hypothyroidism, unspecified: Secondary | ICD-10-CM | POA: Diagnosis not present

## 2017-11-06 DIAGNOSIS — I10 Essential (primary) hypertension: Secondary | ICD-10-CM | POA: Diagnosis not present

## 2017-11-06 DIAGNOSIS — Z681 Body mass index (BMI) 19 or less, adult: Secondary | ICD-10-CM | POA: Diagnosis not present

## 2017-11-06 DIAGNOSIS — J019 Acute sinusitis, unspecified: Secondary | ICD-10-CM | POA: Diagnosis not present

## 2017-11-13 DIAGNOSIS — I10 Essential (primary) hypertension: Secondary | ICD-10-CM | POA: Diagnosis not present

## 2017-11-13 DIAGNOSIS — E039 Hypothyroidism, unspecified: Secondary | ICD-10-CM | POA: Diagnosis not present

## 2017-11-13 DIAGNOSIS — Z681 Body mass index (BMI) 19 or less, adult: Secondary | ICD-10-CM | POA: Diagnosis not present

## 2017-11-13 DIAGNOSIS — E782 Mixed hyperlipidemia: Secondary | ICD-10-CM | POA: Diagnosis not present

## 2017-11-13 DIAGNOSIS — E785 Hyperlipidemia, unspecified: Secondary | ICD-10-CM | POA: Diagnosis not present

## 2017-11-13 DIAGNOSIS — L539 Erythematous condition, unspecified: Secondary | ICD-10-CM | POA: Diagnosis not present

## 2017-11-13 DIAGNOSIS — R000 Tachycardia, unspecified: Secondary | ICD-10-CM | POA: Diagnosis not present

## 2017-11-13 DIAGNOSIS — R944 Abnormal results of kidney function studies: Secondary | ICD-10-CM | POA: Diagnosis not present

## 2017-11-27 DIAGNOSIS — R21 Rash and other nonspecific skin eruption: Secondary | ICD-10-CM | POA: Diagnosis not present

## 2017-11-27 DIAGNOSIS — I10 Essential (primary) hypertension: Secondary | ICD-10-CM | POA: Diagnosis not present

## 2017-11-27 DIAGNOSIS — E782 Mixed hyperlipidemia: Secondary | ICD-10-CM | POA: Diagnosis not present

## 2017-11-27 DIAGNOSIS — L539 Erythematous condition, unspecified: Secondary | ICD-10-CM | POA: Diagnosis not present

## 2017-11-27 DIAGNOSIS — R944 Abnormal results of kidney function studies: Secondary | ICD-10-CM | POA: Diagnosis not present

## 2017-11-27 DIAGNOSIS — E785 Hyperlipidemia, unspecified: Secondary | ICD-10-CM | POA: Diagnosis not present

## 2017-11-27 DIAGNOSIS — Z681 Body mass index (BMI) 19 or less, adult: Secondary | ICD-10-CM | POA: Diagnosis not present

## 2017-11-27 DIAGNOSIS — E039 Hypothyroidism, unspecified: Secondary | ICD-10-CM | POA: Diagnosis not present

## 2018-01-17 DIAGNOSIS — E782 Mixed hyperlipidemia: Secondary | ICD-10-CM | POA: Diagnosis not present

## 2018-01-17 DIAGNOSIS — E039 Hypothyroidism, unspecified: Secondary | ICD-10-CM | POA: Diagnosis not present

## 2018-01-17 DIAGNOSIS — I10 Essential (primary) hypertension: Secondary | ICD-10-CM | POA: Diagnosis not present

## 2018-01-22 DIAGNOSIS — R944 Abnormal results of kidney function studies: Secondary | ICD-10-CM | POA: Diagnosis not present

## 2018-01-22 DIAGNOSIS — E782 Mixed hyperlipidemia: Secondary | ICD-10-CM | POA: Diagnosis not present

## 2018-01-22 DIAGNOSIS — Z681 Body mass index (BMI) 19 or less, adult: Secondary | ICD-10-CM | POA: Diagnosis not present

## 2018-01-22 DIAGNOSIS — E875 Hyperkalemia: Secondary | ICD-10-CM | POA: Diagnosis not present

## 2018-01-22 DIAGNOSIS — I10 Essential (primary) hypertension: Secondary | ICD-10-CM | POA: Diagnosis not present

## 2018-01-22 DIAGNOSIS — E039 Hypothyroidism, unspecified: Secondary | ICD-10-CM | POA: Diagnosis not present

## 2018-05-14 DIAGNOSIS — Z23 Encounter for immunization: Secondary | ICD-10-CM | POA: Diagnosis not present

## 2018-05-26 ENCOUNTER — Other Ambulatory Visit: Payer: Self-pay | Admitting: Internal Medicine

## 2018-05-26 DIAGNOSIS — Z1231 Encounter for screening mammogram for malignant neoplasm of breast: Secondary | ICD-10-CM

## 2018-05-28 DIAGNOSIS — S51801A Unspecified open wound of right forearm, initial encounter: Secondary | ICD-10-CM | POA: Diagnosis not present

## 2018-06-05 DIAGNOSIS — J302 Other seasonal allergic rhinitis: Secondary | ICD-10-CM | POA: Diagnosis not present

## 2018-06-05 DIAGNOSIS — J029 Acute pharyngitis, unspecified: Secondary | ICD-10-CM | POA: Diagnosis not present

## 2018-06-09 DIAGNOSIS — Z681 Body mass index (BMI) 19 or less, adult: Secondary | ICD-10-CM | POA: Diagnosis not present

## 2018-06-09 DIAGNOSIS — J309 Allergic rhinitis, unspecified: Secondary | ICD-10-CM | POA: Diagnosis not present

## 2018-06-09 DIAGNOSIS — J019 Acute sinusitis, unspecified: Secondary | ICD-10-CM | POA: Diagnosis not present

## 2018-07-04 ENCOUNTER — Ambulatory Visit: Payer: Medicare HMO

## 2018-07-25 DIAGNOSIS — E875 Hyperkalemia: Secondary | ICD-10-CM | POA: Diagnosis not present

## 2018-07-25 DIAGNOSIS — R944 Abnormal results of kidney function studies: Secondary | ICD-10-CM | POA: Diagnosis not present

## 2018-07-25 DIAGNOSIS — E039 Hypothyroidism, unspecified: Secondary | ICD-10-CM | POA: Diagnosis not present

## 2018-07-25 DIAGNOSIS — E785 Hyperlipidemia, unspecified: Secondary | ICD-10-CM | POA: Diagnosis not present

## 2018-07-25 DIAGNOSIS — I10 Essential (primary) hypertension: Secondary | ICD-10-CM | POA: Diagnosis not present

## 2018-07-25 DIAGNOSIS — E782 Mixed hyperlipidemia: Secondary | ICD-10-CM | POA: Diagnosis not present

## 2018-07-29 DIAGNOSIS — I10 Essential (primary) hypertension: Secondary | ICD-10-CM | POA: Diagnosis not present

## 2018-07-29 DIAGNOSIS — E875 Hyperkalemia: Secondary | ICD-10-CM | POA: Diagnosis not present

## 2018-07-29 DIAGNOSIS — R944 Abnormal results of kidney function studies: Secondary | ICD-10-CM | POA: Diagnosis not present

## 2018-07-29 DIAGNOSIS — E039 Hypothyroidism, unspecified: Secondary | ICD-10-CM | POA: Diagnosis not present

## 2018-07-29 DIAGNOSIS — E782 Mixed hyperlipidemia: Secondary | ICD-10-CM | POA: Diagnosis not present

## 2018-08-04 DIAGNOSIS — I10 Essential (primary) hypertension: Secondary | ICD-10-CM | POA: Diagnosis not present

## 2018-09-30 DIAGNOSIS — D485 Neoplasm of uncertain behavior of skin: Secondary | ICD-10-CM | POA: Diagnosis not present

## 2018-09-30 DIAGNOSIS — D1801 Hemangioma of skin and subcutaneous tissue: Secondary | ICD-10-CM | POA: Diagnosis not present

## 2018-09-30 DIAGNOSIS — L820 Inflamed seborrheic keratosis: Secondary | ICD-10-CM | POA: Diagnosis not present

## 2018-09-30 DIAGNOSIS — L821 Other seborrheic keratosis: Secondary | ICD-10-CM | POA: Diagnosis not present

## 2018-10-08 DIAGNOSIS — L0100 Impetigo, unspecified: Secondary | ICD-10-CM | POA: Diagnosis not present

## 2018-11-03 DIAGNOSIS — J330 Polyp of nasal cavity: Secondary | ICD-10-CM | POA: Diagnosis not present

## 2018-11-28 DIAGNOSIS — J310 Chronic rhinitis: Secondary | ICD-10-CM | POA: Diagnosis not present

## 2018-12-02 DIAGNOSIS — L720 Epidermal cyst: Secondary | ICD-10-CM | POA: Diagnosis not present

## 2018-12-02 DIAGNOSIS — D485 Neoplasm of uncertain behavior of skin: Secondary | ICD-10-CM | POA: Diagnosis not present

## 2019-01-14 DIAGNOSIS — E785 Hyperlipidemia, unspecified: Secondary | ICD-10-CM | POA: Diagnosis not present

## 2019-01-14 DIAGNOSIS — E782 Mixed hyperlipidemia: Secondary | ICD-10-CM | POA: Diagnosis not present

## 2019-01-14 DIAGNOSIS — E039 Hypothyroidism, unspecified: Secondary | ICD-10-CM | POA: Diagnosis not present

## 2019-01-14 DIAGNOSIS — I10 Essential (primary) hypertension: Secondary | ICD-10-CM | POA: Diagnosis not present

## 2019-01-20 DIAGNOSIS — E039 Hypothyroidism, unspecified: Secondary | ICD-10-CM | POA: Diagnosis not present

## 2019-01-20 DIAGNOSIS — Z0001 Encounter for general adult medical examination with abnormal findings: Secondary | ICD-10-CM | POA: Diagnosis not present

## 2019-01-20 DIAGNOSIS — N182 Chronic kidney disease, stage 2 (mild): Secondary | ICD-10-CM | POA: Diagnosis not present

## 2019-01-20 DIAGNOSIS — I10 Essential (primary) hypertension: Secondary | ICD-10-CM | POA: Diagnosis not present

## 2019-01-20 DIAGNOSIS — E782 Mixed hyperlipidemia: Secondary | ICD-10-CM | POA: Diagnosis not present

## 2019-04-24 DIAGNOSIS — R21 Rash and other nonspecific skin eruption: Secondary | ICD-10-CM | POA: Diagnosis not present

## 2019-06-24 DIAGNOSIS — S60921A Unspecified superficial injury of right hand, initial encounter: Secondary | ICD-10-CM | POA: Diagnosis not present

## 2019-07-21 DIAGNOSIS — E785 Hyperlipidemia, unspecified: Secondary | ICD-10-CM | POA: Diagnosis not present

## 2019-07-21 DIAGNOSIS — E782 Mixed hyperlipidemia: Secondary | ICD-10-CM | POA: Diagnosis not present

## 2019-07-21 DIAGNOSIS — E039 Hypothyroidism, unspecified: Secondary | ICD-10-CM | POA: Diagnosis not present

## 2019-07-21 DIAGNOSIS — I10 Essential (primary) hypertension: Secondary | ICD-10-CM | POA: Diagnosis not present

## 2019-07-27 DIAGNOSIS — E875 Hyperkalemia: Secondary | ICD-10-CM | POA: Diagnosis not present

## 2019-07-27 DIAGNOSIS — E039 Hypothyroidism, unspecified: Secondary | ICD-10-CM | POA: Diagnosis not present

## 2019-07-27 DIAGNOSIS — Z0001 Encounter for general adult medical examination with abnormal findings: Secondary | ICD-10-CM | POA: Diagnosis not present

## 2019-07-27 DIAGNOSIS — E782 Mixed hyperlipidemia: Secondary | ICD-10-CM | POA: Diagnosis not present

## 2019-07-27 DIAGNOSIS — N182 Chronic kidney disease, stage 2 (mild): Secondary | ICD-10-CM | POA: Diagnosis not present

## 2019-07-27 DIAGNOSIS — I10 Essential (primary) hypertension: Secondary | ICD-10-CM | POA: Diagnosis not present

## 2019-08-21 ENCOUNTER — Emergency Department (HOSPITAL_COMMUNITY): Payer: Medicare HMO

## 2019-08-21 ENCOUNTER — Encounter (HOSPITAL_COMMUNITY): Payer: Self-pay | Admitting: Emergency Medicine

## 2019-08-21 ENCOUNTER — Emergency Department (HOSPITAL_COMMUNITY)
Admission: EM | Admit: 2019-08-21 | Discharge: 2019-08-21 | Disposition: A | Discharge status: Home / Self Care | Payer: Medicare HMO | Attending: Emergency Medicine | Admitting: Emergency Medicine

## 2019-08-21 ENCOUNTER — Other Ambulatory Visit: Payer: Self-pay

## 2019-08-21 DIAGNOSIS — I129 Hypertensive chronic kidney disease with stage 1 through stage 4 chronic kidney disease, or unspecified chronic kidney disease: Secondary | ICD-10-CM | POA: Diagnosis not present

## 2019-08-21 DIAGNOSIS — N183 Chronic kidney disease, stage 3 unspecified: Secondary | ICD-10-CM | POA: Diagnosis not present

## 2019-08-21 DIAGNOSIS — R0789 Other chest pain: Secondary | ICD-10-CM | POA: Insufficient documentation

## 2019-08-21 DIAGNOSIS — Z7982 Long term (current) use of aspirin: Secondary | ICD-10-CM | POA: Diagnosis not present

## 2019-08-21 DIAGNOSIS — Z79899 Other long term (current) drug therapy: Secondary | ICD-10-CM | POA: Diagnosis not present

## 2019-08-21 DIAGNOSIS — R0602 Shortness of breath: Secondary | ICD-10-CM | POA: Diagnosis not present

## 2019-08-21 DIAGNOSIS — R079 Chest pain, unspecified: Secondary | ICD-10-CM

## 2019-08-21 LAB — CBC
HCT: 36.9 % (ref 36.0–46.0)
Hemoglobin: 12 g/dL (ref 12.0–15.0)
MCH: 29.9 pg (ref 26.0–34.0)
MCHC: 32.5 g/dL (ref 30.0–36.0)
MCV: 91.8 fL (ref 80.0–100.0)
Platelets: 272 10*3/uL (ref 150–400)
RBC: 4.02 MIL/uL (ref 3.87–5.11)
RDW: 11.6 % (ref 11.5–15.5)
WBC: 7 10*3/uL (ref 4.0–10.5)
nRBC: 0 % (ref 0.0–0.2)

## 2019-08-21 LAB — BASIC METABOLIC PANEL
Anion gap: 5 (ref 5–15)
BUN: 14 mg/dL (ref 8–23)
CO2: 29 mmol/L (ref 22–32)
Calcium: 9.3 mg/dL (ref 8.9–10.3)
Chloride: 102 mmol/L (ref 98–111)
Creatinine, Ser: 1.29 mg/dL — ABNORMAL HIGH (ref 0.44–1.00)
GFR calc Af Amer: 43 mL/min — ABNORMAL LOW (ref >60)
GFR calc non Af Amer: 37 mL/min — ABNORMAL LOW (ref >60)
Glucose, Bld: 170 mg/dL — ABNORMAL HIGH (ref 70–99)
Potassium: 3.5 mmol/L (ref 3.5–5.1)
Sodium: 136 mmol/L (ref 135–145)

## 2019-08-21 LAB — TROPONIN I (HIGH SENSITIVITY)
Troponin I (High Sensitivity): 2 ng/L (ref <18)
Troponin I (High Sensitivity): <2 ng/L (ref <18)

## 2019-08-21 MED ORDER — SODIUM CHLORIDE 0.9% FLUSH: 3.0000 mL | Freq: Once | INTRAVENOUS | Status: DC

## 2019-08-21 NOTE — ED Notes (Signed)
Patient refused discharge vitals.

## 2019-08-21 NOTE — ED Triage Notes (Signed)
Patient reports onset of chest pain and SOB at 1745 this evening. Denies nausea or vomiting, no diaphoresis. Patient localizes pain to her L lower rib area. Reports history of heart murmur.

## 2019-08-21 NOTE — ED Provider Notes (Signed)
[facility] EMERGENCY DEPARTMENT Provider Note   CSN: [id number] Arrival date & time: 08/21/19  1845     History Chief Complaint  Patient presents with  . Shortness of Breath    Brittany Holden is a 85 y.o. female.  Patient presents to the emergency department for evaluation of chest pain and shortness of breath that occurred earlier this evening.  Patient did not get nausea, diaphoresis associated with the symptoms.  Symptoms did not last very long and then resolved.  She indicates the low left chest area where the discomfort was.  By time of exam here in the ER, symptoms are completely resolved and have not recurred.  Patient reports that she works at an hour every day at the gym and never gets exertional chest pain or shortness of breath.        Past Medical History:  Diagnosis Date  . Arrhythmia   . Diverticulosis of colon (without mention of hemorrhage)   . Hypothyroidism   . IBS (irritable bowel syndrome)   . Neuropathy   . Personal history of colonic polyps 1999 & 2007   hyperplastic     Patient Active Problem List   Diagnosis Date Noted  . Chest pain with high risk for cardiac etiology 11/11/2014  . Chest pain 11/11/2014  . CKD (chronic kidney disease) stage 3, GFR 30-59 ml/min 11/11/2014  . Essential hypertension 11/11/2014  . Hyperlipidemia 11/11/2014    Past Surgical History:  Procedure Laterality Date  . ABDOMINAL HYSTERECTOMY    . FOOT ARTHRODESIS     pipj digits  2-4 left foot  . TONSILLECTOMY  06/26/2012   Procedure: TONSILLECTOMY;  Surgeon: Ascencion Dike, MD;  Location: [facility];  Service: ENT;  Laterality: Right;  Marland Kitchen VAGINAL HYSTERECTOMY       OB History    Gravida  1   Para  1   Term  1   Preterm      AB      Living        SAB      TAB      Ectopic      Multiple      Live Births              Family History  Problem Relation Age of Onset  . CAD Neg Hx   . Breast cancer Neg Hx     Social History   Tobacco Use  .  Smoking status: Never Smoker  . Smokeless tobacco: Never Used  Substance Use Topics  . Alcohol use: No  . Drug use: No    Home Medications Prior to Admission medications   Medication Sig Start Date End Date Taking? Authorizing Provider  aspirin EC 81 MG tablet Take 81 mg by mouth daily.    [provider]  digoxin (LANOXIN) 0.25 MG tablet Take 250 mcg by mouth daily.      [provider]  levothyroxine (SYNTHROID, LEVOTHROID) 75 MCG tablet Take 75 mcg by mouth daily.      [provider]  Multiple Minerals-Vitamins (CALCIUM & VIT D3 BONE HEALTH PO) Take 1 tablet by mouth daily.    [provider]  Multiple Vitamins-Minerals (MULTIVITAMIN WITH MINERALS) tablet Take 1 tablet by mouth daily.    [provider]  Omega-3 Fatty Acids (FISH OIL EXTRA STRENGTH) 1200 MG CAPS Take by mouth. 5 daily    [provider]  oxyCODONE-acetaminophen (ROXICET) 5-325 MG/5ML solution Take 5 mLs by mouth every 4 (  four) hours as needed for pain. Patient not taking: Reported on 03/18/2015 06/26/12   Leta Baptist, MD  simvastatin (ZOCOR) 20 MG tablet Take 20 mg by mouth every morning.     [provider]    Allergies    Patient has no known allergies.  Review of Systems   Review of Systems  Respiratory: Positive for shortness of breath.   Cardiovascular: Positive for chest pain.  All other systems reviewed and are negative.   Physical Exam Updated Vital Signs BP 124/71 (BP Location: Right Arm)   Pulse 81   Temp 98 F (36.7 C) (Oral)   Resp 18   Ht 6' (1.829 m)   Wt 54.9 kg   BMI 16.41 kg/m   Physical Exam Vitals and nursing note reviewed.  Constitutional:      General: She is not in acute distress.    Appearance: Normal appearance. She is well-developed.  HENT:     Head: Normocephalic and atraumatic.     Right Ear: Hearing normal.     Left Ear: Hearing normal.     Nose: Nose normal.  Eyes:     Conjunctiva/sclera: Conjunctivae  normal.     Pupils: Pupils are equal, round, and reactive to light.  Cardiovascular:     Rate and Rhythm: Regular rhythm.     Heart sounds: S1 normal and S2 normal. No murmur. No friction rub. No gallop.   Pulmonary:     Effort: Pulmonary effort is normal. No respiratory distress.     Breath sounds: Normal breath sounds.  Chest:     Chest wall: No tenderness.  Abdominal:     General: Bowel sounds are normal.     Palpations: Abdomen is soft.     Tenderness: There is no abdominal tenderness. There is no guarding or rebound. Negative signs include Murphy's sign and McBurney's sign.     Hernia: No hernia is present.  Musculoskeletal:        General: Normal range of motion.     Cervical back: Normal range of motion and neck supple.  Skin:    General: Skin is warm and dry.     Findings: No rash.  Neurological:     Mental Status: She is alert and oriented to person, place, and time.     GCS: GCS eye subscore is 4. GCS verbal subscore is 5. GCS motor subscore is 6.     Cranial Nerves: No cranial nerve deficit.     Sensory: No sensory deficit.     Coordination: Coordination normal.  Psychiatric:        Speech: Speech normal.        Behavior: Behavior normal.        Thought Content: Thought content normal.     ED Results / Procedures / Treatments   Labs (all labs ordered are listed, but only abnormal results are displayed) Labs Reviewed  BASIC METABOLIC PANEL - Abnormal; Notable for the following components:      Result Value   Glucose, Bld 170 (*)    Creatinine, Ser 1.29 (*)    GFR calc non Af Amer 37 (*)    GFR calc Af Amer 43 (*)    All other components within normal limits  CBC  TROPONIN I (HIGH SENSITIVITY)  TROPONIN I (HIGH SENSITIVITY)    EKG EKG Interpretation  Date/Time:  Friday August 21 2019 18:55:39 EST Ventricular Rate:  79 PR Interval:  156 QRS Duration: 88 QT Interval:  358 QTC Calculation:  410 R Axis:   49 Text Interpretation: Normal sinus rhythm  Nonspecific ST and T wave abnormality Abnormal ECG No significant change since last tracing Confirmed by Orpah Greek [phone]) on 08/21/2019 11:08:57 PM   Radiology DG Chest 2 View  Result Date: 08/21/2019 CLINICAL DATA:  Chest pain, shortness of breath EXAM: CHEST - 2 VIEW COMPARISON:  11/11/2014 FINDINGS: There is hyperinflation of the lungs compatible with COPD. Heart and mediastinal contours are within normal limits. No focal opacities or effusions. No acute bony abnormality. IMPRESSION: COPD.  No active disease. Electronically Signed   By: Rolm Baptise M.D.   On: 08/21/2019 23:35    Procedures Procedures (including critical care time)  Medications Ordered in ED Medications  sodium chloride flush (NS) 0.9 % injection 3 mL (3 mLs Intravenous Not Given 08/21/19 2315)    ED Course  I have reviewed the triage vital signs and the nursing notes.  Pertinent labs & imaging results that were available during my care of the patient were reviewed by me and considered in my medical decision making (see chart for details).    MDM Rules/Calculators/A&P                      Patient appears well.  Chest pain is atypical.  Patient is very active and never gets exertional chest pain while working out at the gym.  Symptoms had resolved prior to my exam.  EKG does not show any acute ischemia or infarct.  Troponin negative x2.  Chest x-ray clear.  Exam is completely normal as well.  Patient felt to be very low risk for cardiac etiology.  Abdominal exam benign, nontender.  No GI symptoms.  We will discharge, follow-up with PCP. Final Clinical Impression(s) / ED Diagnoses Final diagnoses:  Chest pain, unspecified type    Rx / DC Orders ED Discharge Orders    None       Lui Bellis, Gwenyth Allegra, MD 08/21/19 2350

## 2019-08-21 NOTE — ED Notes (Signed)
Patient transported to X-ray 

## 2019-10-08 DIAGNOSIS — R011 Cardiac murmur, unspecified: Secondary | ICD-10-CM | POA: Diagnosis not present

## 2019-10-08 DIAGNOSIS — R002 Palpitations: Secondary | ICD-10-CM | POA: Diagnosis not present

## 2019-10-19 ENCOUNTER — Ambulatory Visit (INDEPENDENT_AMBULATORY_CARE_PROVIDER_SITE_OTHER): Payer: Medicare HMO | Admitting: Internal Medicine

## 2019-10-19 ENCOUNTER — Encounter: Payer: Self-pay | Admitting: Internal Medicine

## 2019-10-19 ENCOUNTER — Other Ambulatory Visit (HOSPITAL_COMMUNITY)
Admission: RE | Admit: 2019-10-19 | Discharge: 2019-10-19 | Disposition: A | Discharge status: Home / Self Care | Payer: Medicare HMO | Source: Ambulatory Visit | Attending: Internal Medicine | Admitting: Internal Medicine

## 2019-10-19 ENCOUNTER — Other Ambulatory Visit: Payer: Self-pay

## 2019-10-19 VITALS — BP 189/89 | HR 76 | Temp 97.5°F | Ht 72.0 in | Wt 123.0 lb

## 2019-10-19 DIAGNOSIS — I350 Nonrheumatic aortic (valve) stenosis: Secondary | ICD-10-CM | POA: Diagnosis not present

## 2019-10-19 DIAGNOSIS — R0989 Other specified symptoms and signs involving the circulatory and respiratory systems: Secondary | ICD-10-CM | POA: Diagnosis not present

## 2019-10-19 DIAGNOSIS — Z79899 Other long term (current) drug therapy: Secondary | ICD-10-CM | POA: Insufficient documentation

## 2019-10-19 LAB — BASIC METABOLIC PANEL
Anion gap: 10 (ref 5–15)
BUN: 15 mg/dL (ref 8–23)
CO2: 29 mmol/L (ref 22–32)
Calcium: 9.3 mg/dL (ref 8.9–10.3)
Chloride: 101 mmol/L (ref 98–111)
Creatinine, Ser: 1.12 mg/dL — ABNORMAL HIGH (ref 0.44–1.00)
GFR calc Af Amer: 51 mL/min — ABNORMAL LOW (ref >60)
GFR calc non Af Amer: 44 mL/min — ABNORMAL LOW (ref >60)
Glucose, Bld: 111 mg/dL — ABNORMAL HIGH (ref 70–99)
Potassium: 4.4 mmol/L (ref 3.5–5.1)
Sodium: 140 mmol/L (ref 135–145)

## 2019-10-19 LAB — DIGOXIN LEVEL: Digoxin Level: 1.9 ng/mL (ref 0.8–2.0)

## 2019-10-19 MED ORDER — METOPROLOL SUCCINATE ER 25 MG PO TB24: 25.0000 mg | ORAL_TABLET | Freq: Every day | ORAL | 11 refills | Status: DC

## 2019-10-19 NOTE — Progress Notes (Signed)
Cardiology Office Note   Date:  10/19/2019   ID:  Brittany Holden, DOB Jun 25, 1930, MRN [id number]  PCP:  Celene Squibb, MD  Cardiologist:   Dorris Carnes, MD    Pt referred by Merlyn Albert for palpitations      History of Present Illness: Brittany Holden is a 86 y.o. female with a history of HTN   She is followed by Merlyn Albert  She also has a hx of palpitations   The patient says in the 1970s she had an episode of heart racing when she was in her 39s.  She was admitted to [facility] for 10 days.  She said she was placed in the ICU for part of that time.  Discharged home on Lanoxin.  She has never had a spell like this again.  About 2 weeks ago the patient was in bed and she felt her heart pound 4 times hard.  Not associated with dizziness.  After the spell was over she felt fine.  She was seen by Dr. Nevada Crane the next day.  Has not had a recurrence since.  She said prior to that she might of had 1 other spell.  She is unclear on this.  The patient was seen in the ED at [facility] back in December for some chest pain.  The pain was left lateral.  She was sent home.  She has not had any recurrence.  There was no pleuritic component.  Otherwise the patient feels fine.  She is very active she goes to the [facility] and uses machines and exercises walks a mile per day in 15 minutes.  No problems doing this no heart racing.  No dizziness.  No chest pain.  Echo in 2016:  LVEF 60 to 65%  MIldf AS  Mean gradient 10 mm Hgb    The pt was seen in [facility] ED on 08/21/19 with chst pain/ SOB    Occurred earlier in evening   Resolved on own   Last labs in  LDL 70   HDL 85   TSH 2.63         No outpatient medications have been marked as taking for the 10/19/19 encounter (Office Visit) with Fay Records, MD.     Allergies:   Patient has no known allergies.   Past Medical History:  Diagnosis Date  . Arrhythmia   . Diverticulosis of colon (without mention of hemorrhage)   . Hypothyroidism   . IBS  (irritable bowel syndrome)   . Neuropathy   . Personal history of colonic polyps 1999 & 2007   hyperplastic     Past Surgical History:  Procedure Laterality Date  . ABDOMINAL HYSTERECTOMY    . FOOT ARTHRODESIS     pipj digits  2-4 left foot  . TONSILLECTOMY  06/26/2012   Procedure: TONSILLECTOMY;  Surgeon: Ascencion Dike, MD;  Location: [facility];  Service: ENT;  Laterality: Right;  Marland Kitchen VAGINAL HYSTERECTOMY       Social History:  The patient  reports that she has never smoked. She has never used smokeless tobacco. She reports that she does not drink alcohol or use drugs.   Family History:  The patient's family history is not on file.    ROS:  Please see the history of present illness. All other systems are reviewed and  Negative to the above problem except as noted.    PHYSICAL EXAM: VS:  BP (!) 189/89   Pulse 76  Temp (!) 97.5 F (36.4 C)   Ht 6' (1.829 m)   SpO2 98%   BMI 16.41 kg/m   GEN: Thin 86 year old, in no acute distress  HEENT: normal  Neck: no JVD soft murmurs bilaterally Cardiac: RRR; grade 2/6 systolic murmur heard best at the based.  No rubs, or gallops,no edema  Respiratory:  clear to auscultation bilaterally, normal work of breathing GI: soft, nontender, nondistended, + BS  No hepatomegaly  MS: no deformity Moving all extremities   Skin: warm and dry, no rash Neuro:  Strength and sensation are intact Psych: euthymic mood, full affect   EKG:  EKG is not  ordered today.  On 08/21/19  SR  Nonspecific ST T wave changes with sl saggin of ST segements      Lipid Panel No results found for: CHOL, TRIG, HDL, CHOLHDL, VLDL, LDLCALC, LDLDIRECT    Wt Readings from Last 3 Encounters:  08/21/19 121 lb (54.9 kg)  11/11/14 134 lb 11.2 oz (61.1 kg)  06/23/12 134 lb (60.8 kg)      ASSESSMENT AND PLAN:  1 palpitation.  The patient had one spell of her heart pounding possibly another spell.  Self-limited no associated symptoms.  At rest.  She is on digoxin would  0.25 mg.  I wonder if there was somehow increased risk of PVCs with this.  It would not be my first choice for her with her age and also lack of back for Kasie in significant arrhythmia.  I would recommend checking a be met and a digoxin level today.  I would stop the digoxin.  I would start Toprol-XL 25 mg daily and follow.  2   Hx CP.  Chest pain was atypical has not had a recurrence especially with activity.  3  HTN.  Blood pressure is out of control.  On my check it is 160/80.  I recommended that she take at home and keep a log.  I will start Toprol-XL 25 mg daily.  Would not add anything for now.  4 carotid bruits.  May represent radiation of murmur from chest.  Will get carotid ultrasound to confirm.   5.  HL keep on current simvastatin for now.  Last lipid panel at Dr. Juel Burrow office LDL was 70.  HDL 85.  F/U in 1 months time  Stay hydrated    Current medicines are reviewed at length with the patient today.  The patient does not have concerns regarding medicines.  Signed, Dorris Carnes, MD  10/19/2019 1:53 PM    [facility] [address], [place], [place]  [address] Phone: [phone]; Fax: [phone]

## 2019-10-19 NOTE — Patient Instructions (Signed)
Medication Instructions:  Your physician has recommended you make the following change in your medication:   Stop Taking Digoxin Start Taking Toprol XL 25 mg Daily   *If you need a refill on your cardiac medications before your next appointment, please call your pharmacy*  Lab Work: Your physician recommends that you return for lab work in: Today   If you have labs (blood work) drawn today and your tests are completely normal, you will receive your results only by: Marland Kitchen MyChart Message (if you have MyChart) OR . A paper copy in the mail If you have any lab test that is abnormal or we need to change your treatment, we will call you to review the results.  Testing/Procedures: Your physician has requested that you have an echocardiogram. Echocardiography is a painless test that uses sound waves to create images of your heart. It provides your doctor with information about the size and shape of your heart and how well your heart's chambers and valves are working. This procedure takes approximately one hour. There are no restrictions for this procedure.  Your physician has requested that you have a carotid duplex. This test is an ultrasound of the carotid arteries in your neck. It looks at blood flow through these arteries that supply the brain with blood. Allow one hour for this exam. There are no restrictions or special instructions.    Follow-Up: At [facility], you and your health needs are our priority.  As part of our continuing mission to provide you with exceptional heart care, we have created designated Provider Care Teams.  These Care Teams include your primary Cardiologist (physician) and Advanced Practice Providers (APPs -  Physician Assistants and Nurse Practitioners) who all work together to provide you with the care you need, when you need it.  Your next appointment:    Pending Test Results   The format for your next appointment:   In Person  Provider:   Dorris Carnes,  MD  Other Instructions Thank you for choosing [facility]!

## 2019-10-20 DIAGNOSIS — L819 Disorder of pigmentation, unspecified: Secondary | ICD-10-CM | POA: Diagnosis not present

## 2019-10-20 DIAGNOSIS — L570 Actinic keratosis: Secondary | ICD-10-CM | POA: Diagnosis not present

## 2019-10-20 DIAGNOSIS — L814 Other melanin hyperpigmentation: Secondary | ICD-10-CM | POA: Diagnosis not present

## 2019-10-23 ENCOUNTER — Ambulatory Visit (HOSPITAL_COMMUNITY)
Admission: RE | Admit: 2019-10-23 | Discharge: 2019-10-23 | Disposition: A | Discharge status: Home / Self Care | Payer: Medicare HMO | Source: Ambulatory Visit | Attending: Internal Medicine | Admitting: Internal Medicine

## 2019-10-23 ENCOUNTER — Other Ambulatory Visit: Payer: Self-pay

## 2019-10-23 ENCOUNTER — Other Ambulatory Visit (HOSPITAL_COMMUNITY): Payer: Medicare HMO

## 2019-10-23 DIAGNOSIS — R0989 Other specified symptoms and signs involving the circulatory and respiratory systems: Secondary | ICD-10-CM | POA: Diagnosis not present

## 2019-10-23 DIAGNOSIS — I6523 Occlusion and stenosis of bilateral carotid arteries: Secondary | ICD-10-CM | POA: Diagnosis not present

## 2019-10-27 ENCOUNTER — Other Ambulatory Visit (HOSPITAL_COMMUNITY): Payer: Medicare HMO

## 2019-10-28 ENCOUNTER — Other Ambulatory Visit: Payer: Self-pay

## 2019-10-28 ENCOUNTER — Ambulatory Visit (HOSPITAL_COMMUNITY)
Admission: RE | Admit: 2019-10-28 | Discharge: 2019-10-28 | Disposition: A | Discharge status: Home / Self Care | Payer: Medicare HMO | Source: Ambulatory Visit | Attending: Internal Medicine | Admitting: Internal Medicine

## 2019-10-28 DIAGNOSIS — I350 Nonrheumatic aortic (valve) stenosis: Secondary | ICD-10-CM | POA: Diagnosis not present

## 2019-10-28 NOTE — Progress Notes (Signed)
*  PRELIMINARY RESULTS* Echocardiogram 2D Echocardiogram has been performed.  Brittany Holden 10/28/2019, 9:17 AM

## 2019-10-29 ENCOUNTER — Ambulatory Visit: Payer: Medicare HMO | Admitting: Cardiology

## 2019-11-02 ENCOUNTER — Ambulatory Visit: Payer: Medicare HMO | Admitting: Cardiology

## 2019-11-02 ENCOUNTER — Other Ambulatory Visit (HOSPITAL_COMMUNITY): Payer: Medicare HMO

## 2019-11-12 DIAGNOSIS — S61401A Unspecified open wound of right hand, initial encounter: Secondary | ICD-10-CM | POA: Diagnosis not present

## 2020-01-11 DIAGNOSIS — E875 Hyperkalemia: Secondary | ICD-10-CM | POA: Diagnosis not present

## 2020-01-11 DIAGNOSIS — Z0001 Encounter for general adult medical examination with abnormal findings: Secondary | ICD-10-CM | POA: Diagnosis not present

## 2020-01-11 DIAGNOSIS — E039 Hypothyroidism, unspecified: Secondary | ICD-10-CM | POA: Diagnosis not present

## 2020-01-11 DIAGNOSIS — F5101 Primary insomnia: Secondary | ICD-10-CM | POA: Diagnosis not present

## 2020-01-11 DIAGNOSIS — F411 Generalized anxiety disorder: Secondary | ICD-10-CM | POA: Diagnosis not present

## 2020-01-11 DIAGNOSIS — Z0000 Encounter for general adult medical examination without abnormal findings: Secondary | ICD-10-CM | POA: Diagnosis not present

## 2020-01-11 DIAGNOSIS — E785 Hyperlipidemia, unspecified: Secondary | ICD-10-CM | POA: Diagnosis not present

## 2020-01-11 DIAGNOSIS — E782 Mixed hyperlipidemia: Secondary | ICD-10-CM | POA: Diagnosis not present

## 2020-01-14 DIAGNOSIS — E782 Mixed hyperlipidemia: Secondary | ICD-10-CM | POA: Diagnosis not present

## 2020-01-14 DIAGNOSIS — E875 Hyperkalemia: Secondary | ICD-10-CM | POA: Diagnosis not present

## 2020-01-14 DIAGNOSIS — N182 Chronic kidney disease, stage 2 (mild): Secondary | ICD-10-CM | POA: Diagnosis not present

## 2020-01-14 DIAGNOSIS — I10 Essential (primary) hypertension: Secondary | ICD-10-CM | POA: Diagnosis not present

## 2020-01-14 DIAGNOSIS — Z0001 Encounter for general adult medical examination with abnormal findings: Secondary | ICD-10-CM | POA: Diagnosis not present

## 2020-01-14 DIAGNOSIS — E039 Hypothyroidism, unspecified: Secondary | ICD-10-CM | POA: Diagnosis not present

## 2020-01-19 DIAGNOSIS — F411 Generalized anxiety disorder: Secondary | ICD-10-CM | POA: Diagnosis not present

## 2020-01-19 DIAGNOSIS — E039 Hypothyroidism, unspecified: Secondary | ICD-10-CM | POA: Diagnosis not present

## 2020-01-19 DIAGNOSIS — Z0001 Encounter for general adult medical examination with abnormal findings: Secondary | ICD-10-CM | POA: Diagnosis not present

## 2020-01-19 DIAGNOSIS — Z0000 Encounter for general adult medical examination without abnormal findings: Secondary | ICD-10-CM | POA: Diagnosis not present

## 2020-01-19 DIAGNOSIS — E782 Mixed hyperlipidemia: Secondary | ICD-10-CM | POA: Diagnosis not present

## 2020-01-19 DIAGNOSIS — E875 Hyperkalemia: Secondary | ICD-10-CM | POA: Diagnosis not present

## 2020-01-19 DIAGNOSIS — E785 Hyperlipidemia, unspecified: Secondary | ICD-10-CM | POA: Diagnosis not present

## 2020-01-19 DIAGNOSIS — F5101 Primary insomnia: Secondary | ICD-10-CM | POA: Diagnosis not present

## 2020-04-18 ENCOUNTER — Other Ambulatory Visit: Payer: Self-pay

## 2020-04-18 MED ORDER — METOPROLOL SUCCINATE ER 25 MG PO TB24: 25.0000 mg | ORAL_TABLET | Freq: Every day | ORAL | 3 refills | Status: DC

## 2020-04-18 NOTE — Telephone Encounter (Signed)
Refilled toprol to [facility]

## 2020-04-20 ENCOUNTER — Other Ambulatory Visit: Payer: Self-pay | Admitting: *Deleted

## 2020-04-20 MED ORDER — METOPROLOL SUCCINATE ER 25 MG PO TB24: 25.0000 mg | ORAL_TABLET | Freq: Every day | ORAL | 3 refills | Status: AC

## 2020-04-25 DIAGNOSIS — L309 Dermatitis, unspecified: Secondary | ICD-10-CM | POA: Diagnosis not present

## 2020-06-09 ENCOUNTER — Ambulatory Visit: Payer: Self-pay | Attending: Internal Medicine

## 2020-06-09 DIAGNOSIS — Z23 Encounter for immunization: Secondary | ICD-10-CM

## 2020-06-09 NOTE — Progress Notes (Signed)
   Covid-19 Vaccination Clinic  Name:  Brittany Holden [facility]    MRN: [id number] DOB: [birth year]  06/09/2020  Brittany Holden was observed post Covid-19 immunization for 15 minutes without incident. She was provided with Vaccine Information Sheet and instruction to access the V-Safe system.   Brittany Holden was instructed to call 911 with any severe reactions post vaccine: Marland Kitchen Difficulty breathing  . Swelling of face and throat  . A fast heartbeat  . A bad rash all over body  . Dizziness and weakness

## 2020-07-07 DIAGNOSIS — L539 Erythematous condition, unspecified: Secondary | ICD-10-CM | POA: Diagnosis not present

## 2020-07-07 DIAGNOSIS — E785 Hyperlipidemia, unspecified: Secondary | ICD-10-CM | POA: Diagnosis not present

## 2020-07-07 DIAGNOSIS — Z0001 Encounter for general adult medical examination with abnormal findings: Secondary | ICD-10-CM | POA: Diagnosis not present

## 2020-07-07 DIAGNOSIS — S61401A Unspecified open wound of right hand, initial encounter: Secondary | ICD-10-CM | POA: Diagnosis not present

## 2020-07-07 DIAGNOSIS — R002 Palpitations: Secondary | ICD-10-CM | POA: Diagnosis not present

## 2020-07-07 DIAGNOSIS — Z0000 Encounter for general adult medical examination without abnormal findings: Secondary | ICD-10-CM | POA: Diagnosis not present

## 2020-07-07 DIAGNOSIS — R011 Cardiac murmur, unspecified: Secondary | ICD-10-CM | POA: Diagnosis not present

## 2020-07-07 DIAGNOSIS — R944 Abnormal results of kidney function studies: Secondary | ICD-10-CM | POA: Diagnosis not present

## 2020-07-12 DIAGNOSIS — N1831 Chronic kidney disease, stage 3a: Secondary | ICD-10-CM | POA: Diagnosis not present

## 2020-07-12 DIAGNOSIS — E039 Hypothyroidism, unspecified: Secondary | ICD-10-CM | POA: Diagnosis not present

## 2020-07-12 DIAGNOSIS — E875 Hyperkalemia: Secondary | ICD-10-CM | POA: Diagnosis not present

## 2020-07-12 DIAGNOSIS — I10 Essential (primary) hypertension: Secondary | ICD-10-CM | POA: Diagnosis not present

## 2020-07-12 DIAGNOSIS — E782 Mixed hyperlipidemia: Secondary | ICD-10-CM | POA: Diagnosis not present

## 2020-07-19 DIAGNOSIS — S61401A Unspecified open wound of right hand, initial encounter: Secondary | ICD-10-CM | POA: Diagnosis not present

## 2020-07-19 DIAGNOSIS — Z0000 Encounter for general adult medical examination without abnormal findings: Secondary | ICD-10-CM | POA: Diagnosis not present

## 2020-07-19 DIAGNOSIS — Z0001 Encounter for general adult medical examination with abnormal findings: Secondary | ICD-10-CM | POA: Diagnosis not present

## 2020-07-19 DIAGNOSIS — R002 Palpitations: Secondary | ICD-10-CM | POA: Diagnosis not present

## 2020-07-19 DIAGNOSIS — L539 Erythematous condition, unspecified: Secondary | ICD-10-CM | POA: Diagnosis not present

## 2020-07-19 DIAGNOSIS — R011 Cardiac murmur, unspecified: Secondary | ICD-10-CM | POA: Diagnosis not present

## 2020-07-19 DIAGNOSIS — E785 Hyperlipidemia, unspecified: Secondary | ICD-10-CM | POA: Diagnosis not present

## 2020-07-19 DIAGNOSIS — R944 Abnormal results of kidney function studies: Secondary | ICD-10-CM | POA: Diagnosis not present

## 2020-08-03 ENCOUNTER — Other Ambulatory Visit: Payer: Self-pay | Admitting: Nephrology

## 2020-08-03 ENCOUNTER — Other Ambulatory Visit (HOSPITAL_COMMUNITY): Payer: Self-pay | Admitting: Nephrology

## 2020-08-03 DIAGNOSIS — N1832 Chronic kidney disease, stage 3b: Secondary | ICD-10-CM | POA: Diagnosis not present

## 2020-08-03 DIAGNOSIS — E875 Hyperkalemia: Secondary | ICD-10-CM

## 2020-08-03 DIAGNOSIS — I5032 Chronic diastolic (congestive) heart failure: Secondary | ICD-10-CM | POA: Diagnosis not present

## 2020-08-03 DIAGNOSIS — Z79899 Other long term (current) drug therapy: Secondary | ICD-10-CM | POA: Diagnosis not present

## 2020-08-03 DIAGNOSIS — I129 Hypertensive chronic kidney disease with stage 1 through stage 4 chronic kidney disease, or unspecified chronic kidney disease: Secondary | ICD-10-CM | POA: Diagnosis not present

## 2020-08-15 ENCOUNTER — Other Ambulatory Visit: Payer: Self-pay

## 2020-08-15 ENCOUNTER — Ambulatory Visit (HOSPITAL_COMMUNITY)
Admission: RE | Admit: 2020-08-15 | Discharge: 2020-08-15 | Disposition: A | Discharge status: Home / Self Care | Payer: Medicare HMO | Source: Ambulatory Visit | Attending: Nephrology | Admitting: Nephrology

## 2020-08-15 DIAGNOSIS — N281 Cyst of kidney, acquired: Secondary | ICD-10-CM | POA: Diagnosis not present

## 2020-08-15 DIAGNOSIS — I129 Hypertensive chronic kidney disease with stage 1 through stage 4 chronic kidney disease, or unspecified chronic kidney disease: Secondary | ICD-10-CM | POA: Insufficient documentation

## 2020-08-15 DIAGNOSIS — E875 Hyperkalemia: Secondary | ICD-10-CM | POA: Diagnosis not present

## 2020-08-15 DIAGNOSIS — N1832 Chronic kidney disease, stage 3b: Secondary | ICD-10-CM | POA: Insufficient documentation

## 2020-08-17 DIAGNOSIS — R002 Palpitations: Secondary | ICD-10-CM | POA: Diagnosis not present

## 2020-08-17 DIAGNOSIS — R944 Abnormal results of kidney function studies: Secondary | ICD-10-CM | POA: Diagnosis not present

## 2020-08-17 DIAGNOSIS — Z0000 Encounter for general adult medical examination without abnormal findings: Secondary | ICD-10-CM | POA: Diagnosis not present

## 2020-08-17 DIAGNOSIS — S61401A Unspecified open wound of right hand, initial encounter: Secondary | ICD-10-CM | POA: Diagnosis not present

## 2020-08-17 DIAGNOSIS — R011 Cardiac murmur, unspecified: Secondary | ICD-10-CM | POA: Diagnosis not present

## 2020-08-17 DIAGNOSIS — Z0001 Encounter for general adult medical examination with abnormal findings: Secondary | ICD-10-CM | POA: Diagnosis not present

## 2020-08-17 DIAGNOSIS — E785 Hyperlipidemia, unspecified: Secondary | ICD-10-CM | POA: Diagnosis not present

## 2020-08-17 DIAGNOSIS — L539 Erythematous condition, unspecified: Secondary | ICD-10-CM | POA: Diagnosis not present

## 2020-08-31 DIAGNOSIS — R7303 Prediabetes: Secondary | ICD-10-CM | POA: Diagnosis not present

## 2020-08-31 DIAGNOSIS — I129 Hypertensive chronic kidney disease with stage 1 through stage 4 chronic kidney disease, or unspecified chronic kidney disease: Secondary | ICD-10-CM | POA: Diagnosis not present

## 2020-08-31 DIAGNOSIS — E875 Hyperkalemia: Secondary | ICD-10-CM | POA: Diagnosis not present

## 2020-08-31 DIAGNOSIS — E559 Vitamin D deficiency, unspecified: Secondary | ICD-10-CM | POA: Diagnosis not present

## 2020-08-31 DIAGNOSIS — I5032 Chronic diastolic (congestive) heart failure: Secondary | ICD-10-CM | POA: Diagnosis not present

## 2020-08-31 DIAGNOSIS — N1832 Chronic kidney disease, stage 3b: Secondary | ICD-10-CM | POA: Diagnosis not present

## 2020-08-31 DIAGNOSIS — N281 Cyst of kidney, acquired: Secondary | ICD-10-CM | POA: Diagnosis not present

## 2020-09-22 ENCOUNTER — Ambulatory Visit
Admission: EM | Admit: 2020-09-22 | Discharge: 2020-09-22 | Disposition: A | Discharge status: Home / Self Care | Payer: Medicare HMO | Attending: Emergency Medicine | Admitting: Emergency Medicine

## 2020-09-22 DIAGNOSIS — H00011 Hordeolum externum right upper eyelid: Secondary | ICD-10-CM

## 2020-09-22 MED ORDER — TOBRAMYCIN 0.3 % OP SOLN: 1.0000 [drp] | OPHTHALMIC | 0 refills | Status: DC

## 2020-09-22 NOTE — Discharge Instructions (Addendum)
Continue warm compresses at home.  Soak a wash cloth in warm (not scalding) water and place it over the eyes. As the wash cloth cools, it should be rewarmed and replaced for a total of 5 to 10 minutes of soaking time. Warm compresses should be applied two to four times a day as long as the patient has symptoms Perform lid washing: Either warm water or very dilute baby shampoo can be placed on a clean wash cloth, gauze pad, or cotton swab. Then be advised to gently clean along the lashes and lid margin to remove the accumulated material with care to avoid contacting the ocular surface. If shampoo is used, thorough rinsing is recommended. Vigorous washing should be avoided, as it may cause more irritation.  Prescribed tobramycin eyedrops/take as directed  Follow up with ophthalmology for further evaluation and management if symptoms persists Return or go to ER if you have any new or worsening symptoms such as fever, chills, redness, swelling, eye pain, painful eye movements, vision changes, etc..Marland Kitchen

## 2020-09-22 NOTE — ED Provider Notes (Signed)
Brittany Holden   [id number] 09/22/20 Arrival Time: 3329  CC: Eye irritation  SUBJECTIVE:  Brittany Holden is a 87 y.o. female who presented to the urgent care with a complaint of right eye irritation and swelling for the past 4 days.  Denies a precipitating event, trauma, or close contacts with similar symptoms.  Has not tried any OTC eye drops.  Denies alleviating or aggravating factors.  Denies similar symptoms in the past.  Denies fever, chills, nausea, vomiting, eye pain, painful eye movements, halos, discharge, itching, vision changes, double vision, FB sensation, periorbital erythema.     Denies contact lens use.    ROS: As per HPI.  All other pertinent ROS negative.     Past Medical History:  Diagnosis Date  . Arrhythmia   . Diverticulosis of colon (without mention of hemorrhage)   . Hypothyroidism   . IBS (irritable bowel syndrome)   . Neuropathy   . Personal history of colonic polyps 1999 & 2007   hyperplastic    Past Surgical History:  Procedure Laterality Date  . ABDOMINAL HYSTERECTOMY    . FOOT ARTHRODESIS     pipj digits  2-4 left foot  . TONSILLECTOMY  06/26/2012   Procedure: TONSILLECTOMY;  Surgeon: Ascencion Dike, MD;  Location: [facility];  Service: ENT;  Laterality: Right;  Marland Kitchen VAGINAL HYSTERECTOMY     No Known Allergies No current facility-administered medications on file prior to encounter.   Current Outpatient Medications on File Prior to Encounter  Medication Sig Dispense Refill  . amLODipine (NORVASC) 5 MG tablet Take 5 mg by mouth daily.    Marland Kitchen aspirin 325 MG tablet Take 325 mg by mouth every other day.    Marland Kitchen aspirin EC 81 MG tablet Take 81 mg by mouth every other day.     . levothyroxine (SYNTHROID, LEVOTHROID) 75 MCG tablet Take 75 mcg by mouth daily.      . metoprolol succinate (TOPROL XL) 25 MG 24 hr tablet Take 1 tablet (25 mg total) by mouth daily. 90 tablet 3  . Multiple Minerals-Vitamins (CALCIUM & VIT D3 BONE HEALTH PO) Take 1 tablet by mouth  daily.    . Multiple Vitamins-Minerals (MULTIVITAMIN WITH MINERALS) tablet Take 1 tablet by mouth daily.    . Omega-3 Fatty Acids (FISH OIL EXTRA STRENGTH) 1200 MG CAPS Take by mouth. 5 daily    . simvastatin (ZOCOR) 20 MG tablet Take 20 mg by mouth every morning.      Social History   Socioeconomic History  . Marital status: Widowed    Spouse name: Not on file  . Number of children: Not on file  . Years of education: Not on file  . Highest education level: Not on file  Occupational History  . 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 Smoking status: Never Smoker  . Smokeless tobacco: Never Used  Vaping Use  . Vaping Use: Never used  Substance and Sexual Activity  . Alcohol use: No  . Drug use: No  . Sexual activity: Yes    Birth control/protection: Surgical  Other Topics Concern  . Not on file  Social History Narrative  . Not on file   Social Determinants of Health   Financial Resource Strain: Not on file  Food Insecurity: Not on file  Transportation Needs: Not on file  Physical Activity: Not on file  Stress: Not on file  Social Connections: Not on file  Intimate Partner Violence: Not on file  Family History  Problem Relation Age of Onset  . CAD Neg Hx   . Breast cancer Neg Hx     OBJECTIVE:    Visual Acuity  Right Eye Distance:   Left Eye Distance:   Bilateral Distance:    Right Eye Near:   Left Eye Near:    Bilateral Near:      Vitals:   09/22/20 0910  BP: 133/80  Pulse: 85  Resp: 15  Temp: 98.1 F (36.7 C)  SpO2: 98%    Physical Exam Vitals and nursing note reviewed.  Constitutional:      General: She is not in acute distress.    Appearance: Normal appearance. She is normal weight. She is not ill-appearing, toxic-appearing or diaphoretic.  HENT:     Head: Normocephalic.  Eyes:     General: Lids are normal. Lids are everted, no foreign bodies appreciated. Vision grossly intact. Gaze aligned appropriately. No visual field  deficit.       Right eye: Hordeolum present. No foreign body or discharge.        Left eye: No foreign body, discharge or hordeolum.  Cardiovascular:     Rate and Rhythm: Normal rate and regular rhythm.     Pulses: Normal pulses.     Heart sounds: Normal heart sounds. No murmur heard. No friction rub. No gallop.   Pulmonary:     Effort: Pulmonary effort is normal. No respiratory distress.     Breath sounds: Normal breath sounds. No stridor. No wheezing, rhonchi or rales.  Chest:     Chest wall: No tenderness.  Neurological:     Mental Status: She is alert and oriented to person, place, and time.      ASSESSMENT & PLAN:  1. Hordeolum externum of right upper eyelid     Meds ordered this encounter  Medications  . tobramycin (TOBREX) 0.3 % ophthalmic solution    Sig: Place 1 drop into the right eye every 4 (four) hours.    Dispense:  30 each    Refill:  0   Discharge instructions  Continue warm compresses at home.  Soak a wash cloth in warm (not scalding) water and place it over the eyes. As the wash cloth cools, it should be rewarmed and replaced for a total of 5 to 10 minutes of soaking time. Warm compresses should be applied two to four times a day as long as the patient has symptoms Perform lid washing: Either warm water or very dilute baby shampoo can be placed on a clean wash cloth, gauze pad, or cotton swab. Then be advised to gently clean along the lashes and lid margin to remove the accumulated material with care to avoid contacting the ocular surface. If shampoo is used, thorough rinsing is recommended. Vigorous washing should be avoided, as it may cause more irritation.  Prescribed tobramycin eyedrops/take as directed  Follow up with ophthalmology for further evaluation and management if symptoms persists Return or go to ER if you have any new or worsening symptoms such as fever, chills, redness, swelling, eye pain, painful eye movements, vision changes, etc...  Reviewed  expectations re: course of current medical issues. Questions answered. Outlined signs and symptoms indicating need for more acute intervention. Patient verbalized understanding. After Visit Summary given.   Brittany Holden, [place] 09/22/20 [phone]

## 2020-09-22 NOTE — ED Triage Notes (Signed)
Triaged by provider  

## 2020-10-26 DIAGNOSIS — S61401A Unspecified open wound of right hand, initial encounter: Secondary | ICD-10-CM | POA: Diagnosis not present

## 2020-10-26 DIAGNOSIS — Z0000 Encounter for general adult medical examination without abnormal findings: Secondary | ICD-10-CM | POA: Diagnosis not present

## 2020-10-26 DIAGNOSIS — R011 Cardiac murmur, unspecified: Secondary | ICD-10-CM | POA: Diagnosis not present

## 2020-10-26 DIAGNOSIS — Z0001 Encounter for general adult medical examination with abnormal findings: Secondary | ICD-10-CM | POA: Diagnosis not present

## 2020-10-26 DIAGNOSIS — E785 Hyperlipidemia, unspecified: Secondary | ICD-10-CM | POA: Diagnosis not present

## 2020-10-26 DIAGNOSIS — L539 Erythematous condition, unspecified: Secondary | ICD-10-CM | POA: Diagnosis not present

## 2020-10-26 DIAGNOSIS — R002 Palpitations: Secondary | ICD-10-CM | POA: Diagnosis not present

## 2020-10-26 DIAGNOSIS — R944 Abnormal results of kidney function studies: Secondary | ICD-10-CM | POA: Diagnosis not present

## 2020-11-02 DIAGNOSIS — I5032 Chronic diastolic (congestive) heart failure: Secondary | ICD-10-CM | POA: Diagnosis not present

## 2020-11-02 DIAGNOSIS — I129 Hypertensive chronic kidney disease with stage 1 through stage 4 chronic kidney disease, or unspecified chronic kidney disease: Secondary | ICD-10-CM | POA: Diagnosis not present

## 2020-11-02 DIAGNOSIS — N281 Cyst of kidney, acquired: Secondary | ICD-10-CM | POA: Diagnosis not present

## 2020-11-02 DIAGNOSIS — E559 Vitamin D deficiency, unspecified: Secondary | ICD-10-CM | POA: Diagnosis not present

## 2020-11-02 DIAGNOSIS — N1832 Chronic kidney disease, stage 3b: Secondary | ICD-10-CM | POA: Diagnosis not present

## 2020-12-15 ENCOUNTER — Other Ambulatory Visit (HOSPITAL_COMMUNITY): Payer: Self-pay | Admitting: Nephrology

## 2020-12-15 ENCOUNTER — Other Ambulatory Visit: Payer: Self-pay | Admitting: Nephrology

## 2020-12-15 ENCOUNTER — Encounter: Payer: Self-pay | Admitting: Internal Medicine

## 2020-12-15 DIAGNOSIS — N1832 Chronic kidney disease, stage 3b: Secondary | ICD-10-CM

## 2020-12-15 DIAGNOSIS — I129 Hypertensive chronic kidney disease with stage 1 through stage 4 chronic kidney disease, or unspecified chronic kidney disease: Secondary | ICD-10-CM

## 2021-01-25 DIAGNOSIS — E039 Hypothyroidism, unspecified: Secondary | ICD-10-CM | POA: Diagnosis not present

## 2021-01-25 DIAGNOSIS — I10 Essential (primary) hypertension: Secondary | ICD-10-CM | POA: Diagnosis not present

## 2021-02-01 ENCOUNTER — Ambulatory Visit (HOSPITAL_COMMUNITY)
Admission: RE | Admit: 2021-02-01 | Discharge: 2021-02-01 | Disposition: A | Discharge status: Home / Self Care | Payer: Medicare HMO | Source: Ambulatory Visit | Attending: Nephrology | Admitting: Nephrology

## 2021-02-01 ENCOUNTER — Other Ambulatory Visit: Payer: Self-pay

## 2021-02-01 DIAGNOSIS — N1832 Chronic kidney disease, stage 3b: Secondary | ICD-10-CM | POA: Diagnosis not present

## 2021-02-01 DIAGNOSIS — I129 Hypertensive chronic kidney disease with stage 1 through stage 4 chronic kidney disease, or unspecified chronic kidney disease: Secondary | ICD-10-CM | POA: Insufficient documentation

## 2021-02-01 DIAGNOSIS — N281 Cyst of kidney, acquired: Secondary | ICD-10-CM | POA: Diagnosis not present

## 2021-02-01 DIAGNOSIS — N189 Chronic kidney disease, unspecified: Secondary | ICD-10-CM | POA: Diagnosis not present

## 2021-02-03 DIAGNOSIS — N281 Cyst of kidney, acquired: Secondary | ICD-10-CM | POA: Diagnosis not present

## 2021-02-03 DIAGNOSIS — E875 Hyperkalemia: Secondary | ICD-10-CM | POA: Diagnosis not present

## 2021-02-03 DIAGNOSIS — I129 Hypertensive chronic kidney disease with stage 1 through stage 4 chronic kidney disease, or unspecified chronic kidney disease: Secondary | ICD-10-CM | POA: Diagnosis not present

## 2021-02-03 DIAGNOSIS — N1832 Chronic kidney disease, stage 3b: Secondary | ICD-10-CM | POA: Diagnosis not present

## 2021-02-03 DIAGNOSIS — I5032 Chronic diastolic (congestive) heart failure: Secondary | ICD-10-CM | POA: Diagnosis not present

## 2021-02-08 DIAGNOSIS — I10 Essential (primary) hypertension: Secondary | ICD-10-CM | POA: Diagnosis not present

## 2021-02-08 DIAGNOSIS — E782 Mixed hyperlipidemia: Secondary | ICD-10-CM | POA: Diagnosis not present

## 2021-02-08 DIAGNOSIS — N1831 Chronic kidney disease, stage 3a: Secondary | ICD-10-CM | POA: Diagnosis not present

## 2021-02-08 DIAGNOSIS — E039 Hypothyroidism, unspecified: Secondary | ICD-10-CM | POA: Diagnosis not present

## 2021-02-08 DIAGNOSIS — E875 Hyperkalemia: Secondary | ICD-10-CM | POA: Diagnosis not present

## 2021-03-12 ENCOUNTER — Other Ambulatory Visit: Payer: Self-pay

## 2021-03-12 ENCOUNTER — Encounter (HOSPITAL_COMMUNITY): Payer: Self-pay | Admitting: *Deleted

## 2021-03-12 ENCOUNTER — Emergency Department (HOSPITAL_COMMUNITY)
Admission: EM | Admit: 2021-03-12 | Discharge: 2021-03-12 | Disposition: A | Discharge status: Home / Self Care | Payer: Medicare HMO | Attending: Emergency Medicine | Admitting: Emergency Medicine

## 2021-03-12 ENCOUNTER — Emergency Department (HOSPITAL_COMMUNITY): Payer: Medicare HMO

## 2021-03-12 DIAGNOSIS — N183 Chronic kidney disease, stage 3 unspecified: Secondary | ICD-10-CM | POA: Diagnosis not present

## 2021-03-12 DIAGNOSIS — M19031 Primary osteoarthritis, right wrist: Secondary | ICD-10-CM | POA: Diagnosis not present

## 2021-03-12 DIAGNOSIS — Z79899 Other long term (current) drug therapy: Secondary | ICD-10-CM | POA: Insufficient documentation

## 2021-03-12 DIAGNOSIS — E039 Hypothyroidism, unspecified: Secondary | ICD-10-CM | POA: Insufficient documentation

## 2021-03-12 DIAGNOSIS — M19041 Primary osteoarthritis, right hand: Secondary | ICD-10-CM | POA: Diagnosis not present

## 2021-03-12 DIAGNOSIS — I129 Hypertensive chronic kidney disease with stage 1 through stage 4 chronic kidney disease, or unspecified chronic kidney disease: Secondary | ICD-10-CM | POA: Diagnosis not present

## 2021-03-12 DIAGNOSIS — M79641 Pain in right hand: Secondary | ICD-10-CM

## 2021-03-12 DIAGNOSIS — Z7982 Long term (current) use of aspirin: Secondary | ICD-10-CM | POA: Insufficient documentation

## 2021-03-12 NOTE — ED Triage Notes (Signed)
Pt with right hand pain for past 3-4 days, denies any known injury to hand.  Swelling noted and right 4th an 5th fingers not able to use.

## 2021-03-12 NOTE — ED Provider Notes (Signed)
[facility] EMERGENCY DEPARTMENT Provider Note   CSN: [id number] Arrival date & time: 03/12/21  1258     History Chief Complaint  Patient presents with   Hand Pain    Brittany Holden [facility] is a 87 y.o. female.  HPI  Patient with no significant medical history presents with  chief complaint of right hand pain.  Patient states 2 to 3 days ago she started to have  pain in her right ring and pinky finger, patient states pain came on suddenly, states it hurts when she bends her fingers, states she has been unable to fully extend her ring and pinky finger.   Patient states she has never had this happen to her in the past, denies recent trauma to the area, she denies inciting incident that would cause this.  She denies redness, swelling, denies history of autoimmune diseases, denies recent tick bites, denies systemic rash, or other joint pains.  She has not taken anything for her pain.  She denies alleviating factors.  She does not endorse chest pain, shortness of breath, nausea, vomit, diarrhea.  Past Medical History:  Diagnosis Date   Arrhythmia    Diverticulosis of colon (without mention of hemorrhage)    Hypothyroidism    IBS (irritable bowel syndrome)    Neuropathy    Personal history of colonic polyps 1999 & 2007   hyperplastic     Patient Active Problem List   Diagnosis Date Noted   Chest pain with high risk for cardiac etiology 11/11/2014   Chest pain 11/11/2014   CKD (chronic kidney disease) stage 3, GFR 30-59 ml/min ([place]) 11/11/2014   Essential hypertension 11/11/2014   Hyperlipidemia 11/11/2014    Past Surgical History:  Procedure Laterality Date   ABDOMINAL HYSTERECTOMY     FOOT ARTHRODESIS     pipj digits  2-4 left foot   TONSILLECTOMY  06/26/2012   Procedure: TONSILLECTOMY;  Surgeon: Ascencion Dike, MD;  Location: [facility];  Service: ENT;  Laterality: Right;   VAGINAL HYSTERECTOMY       OB History     Gravida  1   Para  1   Term  1   Preterm  0   AB  0    Living         SAB  0   IAB  0   Ectopic  0   Multiple      Live Births              Family History  Problem Relation Age of Onset   CAD Neg Hx    Breast cancer Neg Hx     Social History   Tobacco Use   Smoking status: Never   Smokeless tobacco: Never  Vaping Use   Vaping Use: Never used  Substance Use Topics   Alcohol use: No   Drug use: No    Home Medications Prior to Admission medications   Medication Sig Start Date End Date Taking? Authorizing Provider  amLODipine (NORVASC) 5 MG tablet Take 5 mg by mouth daily.    [provider]  aspirin 325 MG tablet Take 325 mg by mouth every other day.    [provider]  aspirin EC 81 MG tablet Take 81 mg by mouth every other day.     [provider]  levothyroxine (SYNTHROID, LEVOTHROID) 75 MCG tablet Take 75 mcg by mouth daily.      [provider]  metoprolol succinate (TOPROL XL) 25 MG 24 hr tablet  Take 1 tablet (25 mg total) by mouth daily. 04/20/20   Fay Records, MD  Multiple Minerals-Vitamins (CALCIUM & VIT D3 BONE HEALTH PO) Take 1 tablet by mouth daily.    [provider]  Multiple Vitamins-Minerals (MULTIVITAMIN WITH MINERALS) tablet Take 1 tablet by mouth daily.    [provider]  Omega-3 Fatty Acids (FISH OIL EXTRA STRENGTH) 1200 MG CAPS Take by mouth. 5 daily    [provider]  simvastatin (ZOCOR) 20 MG tablet Take 20 mg by mouth every morning.     [provider]  tobramycin (TOBREX) 0.3 % ophthalmic solution Place 1 drop into the right eye every 4 (four) hours. 09/22/20   [name], FNP    Allergies    Patient has no known allergies.  Review of Systems   Review of Systems  Constitutional:  Negative for chills and fever.  HENT:  Negative for congestion.   Respiratory:  Negative for shortness of breath.   Cardiovascular:  Negative for chest pain.  Gastrointestinal:  Negative for abdominal pain.  Genitourinary:   Negative for enuresis.  Musculoskeletal:  Negative for back pain.       Right hand pain.   Skin:  Negative for rash.  Neurological:  Negative for dizziness.  Hematological:  Does not bruise/bleed easily.   Physical Exam Updated Vital Signs BP (!) 154/81   Pulse 79   Temp 98.1 F (36.7 C) (Oral)   Resp 14   Ht 6' (1.829 m)   Wt 58.1 kg   SpO2 97%   BMI 17.36 kg/m   Physical Exam Vitals and nursing note reviewed.  Constitutional:      General: She is not in acute distress.    Appearance: She is not ill-appearing.  HENT:     Head: Normocephalic and atraumatic.     Nose: No congestion.  Eyes:     Conjunctiva/sclera: Conjunctivae normal.  Cardiovascular:     Rate and Rhythm: Normal rate and regular rhythm.     Pulses: Normal pulses.     Heart sounds: No murmur heard.   No friction rub. No gallop.  Pulmonary:     Effort: Pulmonary effort is normal. No respiratory distress.  Musculoskeletal:     Comments: Right hand was visualized slight edema noted in the dorsum aspect of the hand around the fourth and fifth metacarpals, there is no erythema, no abrasions or other acute abnormalities present.  Area was not warm to the touch, non tender to palpation, she can fully flex all fingers in all joints but is unable to fully extend her ring and pinky finger.  She has full sensation on the dorsum and palmar aspect of these fingers, neurovascular fully intact.  Skin:    General: Skin is warm and dry.  Neurological:     Mental Status: She is alert.  Psychiatric:        Mood and Affect: Mood normal.    ED Results / Procedures / Treatments   Labs (all labs ordered are listed, but only abnormal results are displayed) Labs Reviewed - No data to display  EKG None  Radiology DG Hand Complete Right  Result Date: 03/12/2021 CLINICAL DATA:  Pain metacarpals 3 through 5.  No injury. EXAM: RIGHT HAND - COMPLETE 3+ VIEW COMPARISON:  None. FINDINGS: There is mild periarticular osteopenia.  Degenerative changes over the radiocarpal joint, distal radioulnar joint and radial side of the carpal bones including the first carpometacarpal joint. Minimal degenerative changes over the  first MCP joint and mild degenerative changes over the interphalangeal joints. There are mild erosive changes over the third D IP joint and base of the first proximal phalanx. Alignment is normal. No fracture or dislocation. IMPRESSION: 1. No acute findings. 2. Degenerative changes as described with findings suggesting erosive osteoarthritis versus inflammatory arthropathy such as psoriatic arthritis. Electronically Signed   By: Marin Olp M.D.   On: 03/12/2021 15:04    Procedures Procedures   Medications Ordered in ED Medications - No data to display  ED Course  I have reviewed the triage vital signs and the nursing notes.  Pertinent labs & imaging results that were available during my care of the patient were reviewed by me and considered in my medical decision making (see chart for details).    MDM Rules/Calculators/A&P                         Initial impression-patient presents with right hand pain.  She is alert, does not appear in distress, vital signs reassuring.  Will obtain imaging for further evaluation.  Work-up-x-ray reveals no acute findings, does show degenerative changes suggestive of OA versus inflammatory arthritis.  Rule out- I have low suspicion for septic arthritis as patient denies IV drug use, skin exam was performed no erythematous, edematous, warm joints noted on exam, no new heart murmur heard on exam.  Low suspicion for fracture or dislocation as x-ray does not feel any significant findings. low suspicion for ligament or tendon damage as area was palpated no gross defects noted, she does have decreased range of motion with flexion of the fourth and fifth digit but I suspect this might be an inflammatory cause.  Low suspicion for compartment syndrome as area was palpated it was soft  to the touch, neurovascular fully intact.   Plan-  Right hand pain-suspect extensor tendon inflammation versus possible arthritis.  I would recommend NSAIDs but patient has decreased GFR and creatinine do not want to further exacerbate this.  Will recommend Tylenol for now, follow-up with orthopedic surgery for further evaluation.  Vital signs have remained stable, no indication for hospital admission.  Patient discussed with attending and they agreed with assessment and plan.  Patient given at home care as well strict return precautions.  Patient verbalized that they understood agreed to said plan.  Final Clinical Impression(s) / ED Diagnoses Final diagnoses:  Right hand pain    Rx / DC Orders ED Discharge Orders     None        Marcello Fennel, PA-C 03/12/21 1537    Fredia Sorrow, MD 03/14/21 [phone]

## 2021-03-12 NOTE — Discharge Instructions (Addendum)
Your imaging shows possible osteoarthritis versus inflammatory arthritis, I recommend Tylenol for now, applying warm compresses to the area.  Given the contact information for orthopedic surgery please call for follow-up.  Come back to the emergency department if you develop chest pain, shortness of breath, severe abdominal pain, uncontrolled nausea, vomiting, diarrhea.

## 2021-03-20 ENCOUNTER — Ambulatory Visit: Payer: Medicare HMO | Admitting: Orthopedic Surgery

## 2021-03-20 ENCOUNTER — Other Ambulatory Visit: Payer: Self-pay

## 2021-03-20 ENCOUNTER — Encounter: Payer: Self-pay | Admitting: Orthopedic Surgery

## 2021-03-20 VITALS — BP 155/114 | HR 82 | Ht 72.0 in | Wt 134.4 lb

## 2021-03-20 DIAGNOSIS — M79641 Pain in right hand: Secondary | ICD-10-CM

## 2021-03-20 NOTE — Patient Instructions (Signed)
OT occupational therapy at [facility] outpatient to splint the MCPs of small and ring finger

## 2021-03-20 NOTE — Progress Notes (Signed)
NEW PROBLEM//OFFICE VISIT  Summary assessment and plan:   87 year old female atraumatic subluxation of the extensor mechanism at the MCP joint of the ring and small finger sent for occupational therapy splinting  Chief Complaint  Patient presents with   New Patient (Initial Visit)   Hand Pain    Right hand/no known injury/x 55 weeks   87 year old female with history of atraumatic onset of inability to extend the MCP joints of her ring and small finger with no history of trauma presents with lack of extension of the MCP joints    MEDICAL DECISION MAKING  A.  Encounter Diagnosis  Name Primary?   Pain in right hand Yes    B. DATA ANALYSED:   IMAGING: Interpretation of images: External images show possible psoriatic versus inflammatory arthritis of the right hand on 3 views taken July 10  Orders: Occupational Therapy splinting  Outside records reviewed: Emergency room   C. MANAGEMENT   Splint follow-up 4 weeks  No orders of the defined types were placed in this encounter.    BP (!) 155/114   Pulse 82   Ht 6' (1.829 m)   Wt 134 lb 6.4 oz (61 kg)   BMI 18.23 kg/m    General appearance: Well-developed well-nourished no gross deformities  Cardiovascular normal pulse and perfusion normal color without edema  Neurologically no sensation loss or deficits or pathologic reflexes  Psychological: Awake alert and oriented x3 mood and affect normal  Skin no lacerations or ulcerations no nodularity no palpable masses, no erythema or nodularity  Musculoskeletal:   Right hand with gravity removed patient still does not extend the MCP joint but can extend the DIP and PIP joint of both ring and small finger there is no neurologic deficit noted.  No numbness or tingling noted.   ROS No fever no skin rash denies numbness or tingling  Past Medical History:  Diagnosis Date   Arrhythmia    Diverticulosis of colon (without mention of hemorrhage)    Hypothyroidism    IBS  (irritable bowel syndrome)    Neuropathy    Personal history of colonic polyps 1999 & 2007   hyperplastic     Past Surgical History:  Procedure Laterality Date   ABDOMINAL HYSTERECTOMY     FOOT ARTHRODESIS     pipj digits  2-4 left foot   TONSILLECTOMY  06/26/2012   Procedure: TONSILLECTOMY;  Surgeon: Ascencion Dike, MD;  Location: [facility];  Service: ENT;  Laterality: Right;   VAGINAL HYSTERECTOMY      Family History  Problem Relation Age of Onset   CAD Neg Hx    Breast cancer Neg Hx    Social History   Tobacco Use   Smoking status: Never   Smokeless tobacco: Never  Vaping Use   Vaping Use: Never used  Substance Use Topics   Alcohol use: No   Drug use: No    No Known Allergies  Current Meds  Medication Sig   chlorthalidone (HYGROTON) 25 MG tablet Take 25 mg by mouth every other day.   Cholecalciferol 25 MCG (1000 UT) capsule Take 1,000 Units by mouth daily.   levothyroxine (SYNTHROID, LEVOTHROID) 75 MCG tablet Take 75 mcg by mouth daily.     metoprolol succinate (TOPROL XL) 25 MG 24 hr tablet Take 1 tablet (25 mg total) by mouth daily.   Multiple Minerals-Vitamins (CALCIUM & VIT D3 BONE HEALTH PO) Take 1 tablet by mouth daily.   simvastatin (ZOCOR) 20 MG tablet Take 20  mg by mouth every morning.         Arther Abbott, MD  03/20/2021 2:45 PM

## 2021-03-27 ENCOUNTER — Other Ambulatory Visit: Payer: Self-pay

## 2021-03-27 ENCOUNTER — Ambulatory Visit (HOSPITAL_COMMUNITY): Payer: Medicare HMO

## 2021-03-27 ENCOUNTER — Ambulatory Visit (HOSPITAL_COMMUNITY): Payer: Medicare HMO | Attending: Orthopedic Surgery

## 2021-03-27 ENCOUNTER — Encounter (HOSPITAL_COMMUNITY): Payer: Self-pay

## 2021-03-27 DIAGNOSIS — M25641 Stiffness of right hand, not elsewhere classified: Secondary | ICD-10-CM | POA: Insufficient documentation

## 2021-03-27 DIAGNOSIS — R29898 Other symptoms and signs involving the musculoskeletal system: Secondary | ICD-10-CM

## 2021-03-27 NOTE — Therapy (Signed)
[place], [place], [address] Phone: [phone]   Fax:  [phone]  Occupational Therapy Evaluation  Patient Details  Name: Brittany Holden [facility] MRN: [id number] Date of Birth: 01/03/1930 Referring Provider (OT): Brittany Abbott, MD   Encounter Date: 03/27/2021   OT End of Session - 03/27/21 1737     Visit Number 1    Number of Visits 4    Date for OT Re-Evaluation 04/07/21    Authorization Type [employer]    Authorization Time Period $20 copay, no visit limit, authorization requested for 4 visits in case any adjustments are needed for splint.    Progress Note Due on Visit 10    OT Start Time 1115    OT Stop Time 1223    OT Time Calculation (min) 68 min    Activity Tolerance Patient tolerated treatment well    Behavior During Therapy WFL for tasks assessed/performed             Past Medical History:  Diagnosis Date   Arrhythmia    Diverticulosis of colon (without mention of hemorrhage)    Hypothyroidism    IBS (irritable bowel syndrome)    Neuropathy    Personal history of colonic polyps 1999 & 2007   hyperplastic     Past Surgical History:  Procedure Laterality Date   ABDOMINAL HYSTERECTOMY     FOOT ARTHRODESIS     pipj digits  2-4 left foot   TONSILLECTOMY  06/26/2012   Procedure: TONSILLECTOMY;  Surgeon: Brittany Dike, MD;  Location: [facility];  Service: ENT;  Laterality: Right;   VAGINAL HYSTERECTOMY      There were no vitals filed for this visit.   Subjective Assessment - 03/27/21 1722     Subjective  S: I was pounding styroform in a vase [place] the [place] to make a flower arrangement.    Patient is accompanied by: Family member   Son: Brittany Holden   Pertinent History Patient is a 87 y/o female S/P atraumatic subluxation extensor mechanism at the MCP joint of right hand ring/small finger. Pt began to experience this approximately 2 weeks ago when she was attempting to use her hand as a hammer  to pound styrofoam. Pt is unable to extend her 4th and 5th digits of her right hand fully. Dr. Aline Holden has referred patient to occupational therapy for a splint fabrication.    Patient Stated Goals To have a splint made.    Currently in Pain? No/denies               [facility] OT Assessment - 03/27/21 1728       Assessment   Medical Diagnosis splint fabrication/ Subluxation extensor mechanism at the MCP joint of ring/small finger    Referring Provider (OT) Brittany Abbott, MD    Onset Date/Surgical Date --   2 weeks ago   Hand Dominance Right    Next MD Visit 04/17/21    Prior Therapy None      Precautions   Precautions Other (comment)    Precaution Comments Pt to wear fabricated splint from OT provided at today's visit at all times unless bathing or dressing.      Restrictions   Weight Bearing Restrictions No      Balance Screen   Has the patient fallen in the past 6 months No      Home  Environment   Family/patient expects to be discharged to: Private residence  Prior Function   Level of Independence Independent      ADL   ADL comments Unable to use her right hand fully as her dominant extremity due to lack of full MCP extension for the 4th and 5th digits of the right hand.      Mobility   Mobility Status Independent      Written Expression   Dominant Hand Right      Cognition   Overall Cognitive Status Within Functional Limits for tasks assessed      Observation/Other Assessments   Focus on Therapeutic Outcomes (FOTO)  N/A      Coordination   Fine Motor Movements are Fluid and Coordinated No      ROM / Strength   AROM / PROM / Strength PROM;AROM      AROM   Overall AROM Comments Right hand. Patient able to make a full fist. Unable to extend 4th and 5th digit fully. 4th digit extension MCP joint: 38 degrees. 5th digit extension MCP joint: 70 degrees      PROM   Overall PROM Comments pt is able to demonstrate full MCP extension for 1st-5th digits.                       OT Treatments/Exercises (OP) - 03/27/21 1733       Splinting   Splinting Static volar based splint fabricated including the 4th and 5th digit support. 1 inch velcros strap used around 4th and 5th digits. Two 2 inch strapst used to secure around wrist and through thumb webspace. 4 sockettes provided and additional strapping.                   OT Education - 03/27/21 1735     Education Details Provided education on donning/doffing technique, cleaning, wearing schedule, precautions. Call clinic if any adjustments need to be made.    Person(s) Educated Patient;Child(ren)    Methods Explanation;Handout;Demonstration    Comprehension Verbalized understanding;Returned demonstration              OT Short Term Goals - 03/27/21 1742       OT SHORT TERM GOAL #1   Title Patient will be educated and verbalize/demonstrate understanding of use of splint, donning/doffing technique, care management, wearing schedule and precautions of fabricated splint for right hand.    Time 2    Period Weeks    Status New    Target Date 04/07/21      OT SHORT TERM GOAL #2   Title Patient will demonstrate independence with donning and doffing technique of fabricated splint in order to complete required bathing and dressing tasks independently.    Time 2    Period Weeks    Status New                      Plan - 03/27/21 1739     Clinical Impression Statement A: Patient is a 87 y/o female presenting to OT with need for splint fabrication due to subluxation extensor mechanism at the MCP joint of the right ring/small finger resulting in difficulty with ROM of the right hand which is causing difficulty utilizing her right hand as her dominant hand for ADL tasks.    OT Occupational Profile and History Problem Focused Assessment - Including review of records relating to presenting problem    Occupational performance deficits (Please refer to evaluation for  details): ADL's;IADL's;Leisure    Body Structure / Function / Physical  Skills ROM;Decreased knowledge of use of DME;Decreased knowledge of precautions;Coordination    Rehab Potential Good    Clinical Decision Making Limited treatment options, no task modification necessary    Comorbidities Affecting Occupational Performance: May have comorbidities impacting occupational performance    Modification or Assistance to Complete Evaluation  Min-Moderate modification of tasks or assist with assess necessary to complete eval    OT Frequency 2x / week    OT Duration 2 weeks   as needed for splint adjustment appointments if needed.   OT Treatment/Interventions Patient/family education;Splinting    Plan P: One time visit for splint fabrication with additional visits requested from insurance for authorization if additional visits will be needed for splint adjustments. patient is is follow up with Dr. Dutch Quint in 4 weeks.    Consulted and Agree with Plan of Care Patient             Patient will benefit from skilled therapeutic intervention in order to improve the following deficits and impairments:   Body Structure / Function / Physical Skills: ROM, Decreased knowledge of use of DME, Decreased knowledge of precautions, Coordination       Visit Diagnosis: Stiffness of right hand, not elsewhere classified - Plan: Ot plan of care cert/re-cert  Other symptoms and signs involving the musculoskeletal system - Plan: Ot plan of care cert/re-cert    Problem List Patient Active Problem List   Diagnosis Date Noted   Chest pain with high risk for cardiac etiology 11/11/2014   Chest pain 11/11/2014   CKD (chronic kidney disease) stage 3, GFR 30-59 ml/min (HCC) 11/11/2014   Essential hypertension 11/11/2014   Hyperlipidemia 11/11/2014   Ailene Ravel, OTR/L,CBIS  [phone]  03/27/2021, 5:46 PM  [place] [address] [place], [place],  [address] Phone: [phone]   Fax:  [phone]  Name: Brittany Holden [facility] MRN: [id number] Date of Birth: 1930/01/22

## 2021-03-27 NOTE — Patient Instructions (Signed)
Your Splint This splint should initially be fitted by a healthcare practitioner.  The healthcare practitioner is responsible for providing wearing instructions and precautions to the patient, other healthcare practitioners and care provider involved in the patient's care.  This splint was custom made for you. Please read the following instructions to learn about wearing and caring for your splint.  Precautions Should your splint cause any of the following problems, remove the splint immediately and contact your therapist/physician. Swelling Severe Pain Pressure Areas Stiffness Numbness  Do not wear your splint while operating machinery unless it has been fabricated for that purpose.  When To Wear Your Splint Where your splint according to your therapist/physician instructions. All the time. May remove for bathing and washing hands.   Care and Cleaning of Your Splint Keep your splint away from open flames. Your splint will lose its shape in temperatures over 135 degrees Farenheit, ( in car windows, near radiators, ovens or in hot water).  Never make any adjustments to your splint, if the splint needs adjusting remove it and make an appointment to see your therapist. Your splint, including the cushion liner may be cleaned with soap and lukewarm water.  Do not immerse in hot water over 135 degrees Farenheit. Straps may be washed with soap and water, but do not moisten the self-adhesive portion. For ink or hard to remove spots use a scouring cleanser which contains chlorine.  Rinse the splint thoroughly after using chlorine cleanser.

## 2021-03-28 ENCOUNTER — Encounter (HOSPITAL_COMMUNITY): Payer: Self-pay

## 2021-03-28 NOTE — Therapy (Signed)
[place], [place], [address] Phone: [phone]   Fax:  [phone]  Occupational Therapy Treatment  Patient Details  Name: Swapna Grech [facility] MRN: [id number] Date of Birth: Jan 28, 1930 Referring Provider (OT): Arther Abbott, MD   Encounter Date: 03/27/2021   OT End of Session - 03/28/21 1336     Visit Number 2    Number of Visits 4    Date for OT Re-Evaluation 04/07/21    Authorization Type [employer]    Authorization Time Period $20 copay, no visit limit, authorization requested for 4 visits in case any adjustments are needed for splint.    Authorization - Visit Number 1    Authorization - Number of Visits 4    Progress Note Due on Visit 10    OT Start Time [id number]    OT Stop Time 1555    OT Time Calculation (min) 40 min    Activity Tolerance Patient tolerated treatment well    Behavior During Therapy WFL for tasks assessed/performed             Past Medical History:  Diagnosis Date   Arrhythmia    Diverticulosis of colon (without mention of hemorrhage)    Hypothyroidism    IBS (irritable bowel syndrome)    Neuropathy    Personal history of colonic polyps 1999 & 2007   hyperplastic     Past Surgical History:  Procedure Laterality Date   ABDOMINAL HYSTERECTOMY     FOOT ARTHRODESIS     pipj digits  2-4 left foot   TONSILLECTOMY  06/26/2012   Procedure: TONSILLECTOMY;  Surgeon: Ascencion Dike, MD;  Location: [facility];  Service: ENT;  Laterality: Right;   VAGINAL HYSTERECTOMY      There were no vitals filed for this visit.   Subjective Assessment - 03/28/21 1331     Subjective  S: I cut it because it was digging into me.    Currently in Pain? No/denies                [facility] OT Assessment - 03/28/21 1332       Assessment   Medical Diagnosis splint fabrication/ Subluxation extensor mechanism at the MCP joint of ring/small finger      Precautions   Precautions Other (comment)     Precaution Comments Pt to wear fabricated splint from OT provided at today's visit at all times unless bathing or dressing.                      OT Treatments/Exercises (OP) - 03/28/21 1332       Splinting   Splinting New splint fabricated. Ulnar gutter splint fabricated for the right hand to provide protection and joint support to the MCP joint of the ring and small finger. One 1 inch soft one inch strap placed to secure ring and small finger. Once 1.5 inch strap placed along thumb web space to provide support. Extra strapping provided.                    OT Education - 03/28/21 1334     Education Details Reviewed precautions and to remove splint and call the clinic to schedule a time to come in if any adjustments need to be made. patient was informed again to not make any splint adjustments herself.    Person(s) Educated Patient    Methods Explanation    Comprehension Verbalized understanding  OT Short Term Goals - 03/28/21 1340       OT SHORT TERM GOAL #1   Title Patient will be educated and verbalize/demonstrate understanding of use of splint, donning/doffing technique, care management, wearing schedule and precautions of fabricated splint for right hand.    Time 2    Period Weeks    Status On-going    Target Date 04/07/21      OT SHORT TERM GOAL #2   Title Patient will demonstrate independence with donning and doffing technique of fabricated splint in order to complete required bathing and dressing tasks independently.    Time 2    Period Weeks    Status On-going                      Plan - 03/28/21 1336     Clinical Impression Statement A: Pt arrived to clinic after previously attending splint evaluation appointment this AM. patient reports that she has made adjustments to her splint herself and cut approximately 1 inch of splinting material due to discomfort. patient reports that she does not remember OT providing  education at previous clinic regarding precautions or not making adjustments. As splint is not salvageable, a new splint is fabricated. This time, OT fabricated an ulnar gutter splint that will provide more coverage of the index and small finger than what is typically needed to help with comfort level and compliance of wearing. Reviewed precautions once again with patient and reminded her to not make any adjustments and call the clnic for an appointment. Pt verbalized understanding.    Body Structure / Function / Physical Skills ROM;Decreased knowledge of use of DME;Decreased knowledge of precautions;Coordination    Plan P: Pt will contact clinic if any additional splint adjustments are needed.    Consulted and Agree with Plan of Care Patient             Patient will benefit from skilled therapeutic intervention in order to improve the following deficits and impairments:   Body Structure / Function / Physical Skills: ROM, Decreased knowledge of use of DME, Decreased knowledge of precautions, Coordination       Visit Diagnosis: Stiffness of right hand, not elsewhere classified  Other symptoms and signs involving the musculoskeletal system    Problem List Patient Active Problem List   Diagnosis Date Noted   Chest pain with high risk for cardiac etiology 11/11/2014   Chest pain 11/11/2014   CKD (chronic kidney disease) stage 3, GFR 30-59 ml/min ([facility]) 11/11/2014   Essential hypertension 11/11/2014   Hyperlipidemia 11/11/2014    Ailene Ravel, OTR/L,CBIS  [phone]  03/28/2021, 1:41 PM  [place], [place], [address] Phone: [phone]   Fax:  [phone]  Name: Brindle Borton [facility] MRN: [id number] Date of Birth: 1930-04-04

## 2021-04-17 ENCOUNTER — Ambulatory Visit: Payer: Medicare HMO | Admitting: Orthopedic Surgery

## 2021-04-24 ENCOUNTER — Encounter: Payer: Self-pay | Admitting: Gastroenterology

## 2021-04-24 ENCOUNTER — Ambulatory Visit (INDEPENDENT_AMBULATORY_CARE_PROVIDER_SITE_OTHER): Payer: Medicare HMO | Admitting: Orthopedic Surgery

## 2021-04-24 ENCOUNTER — Other Ambulatory Visit: Payer: Self-pay

## 2021-04-24 ENCOUNTER — Encounter: Payer: Self-pay | Admitting: Orthopedic Surgery

## 2021-04-24 VITALS — BP 134/104 | HR 83 | Ht 72.0 in | Wt 134.4 lb

## 2021-04-24 DIAGNOSIS — M25641 Stiffness of right hand, not elsewhere classified: Secondary | ICD-10-CM | POA: Diagnosis not present

## 2021-04-24 DIAGNOSIS — M79641 Pain in right hand: Secondary | ICD-10-CM | POA: Diagnosis not present

## 2021-04-24 NOTE — Patient Instructions (Signed)
[facility] will call you with appointment. If you have not heard from them in 2-3 business days call them to schedule [phone]   

## 2021-04-24 NOTE — Progress Notes (Signed)
Chief Complaint  Patient presents with   Hand Pain    Right hand//follow up//pt states hand is "fair"   Summary assessment and plan:    87 year old female atraumatic subluxation of the extensor mechanism at the MCP joint of the ring and small finger sent for occupational therapy splinting       Chief Complaint  Patient presents with   New Patient (Initial Visit)   Hand Pain      Right hand/no known injury/x 2 weeks    87 year old female with history of atraumatic onset of inability to extend the MCP joints of her ring and small finger with no history of trauma presents with lack of extension of the MCP joints   Last visit March 20, 2021  After 4 weeks of splinting the patient still has lack of extension of her MP joint of the small and ring finger  She now remembers that she was hitting her hand on a vase and 2 days later she noticed that the fingers were not extending  At this point that she has failed splinting recommend that she see hand specialist to evaluate

## 2021-05-02 ENCOUNTER — Other Ambulatory Visit: Payer: Self-pay | Admitting: Orthopedic Surgery

## 2021-05-02 DIAGNOSIS — M19031 Primary osteoarthritis, right wrist: Secondary | ICD-10-CM | POA: Diagnosis not present

## 2021-05-02 DIAGNOSIS — M79641 Pain in right hand: Secondary | ICD-10-CM | POA: Diagnosis not present

## 2021-05-02 DIAGNOSIS — S66811A Strain of other specified muscles, fascia and tendons at wrist and hand level, right hand, initial encounter: Secondary | ICD-10-CM | POA: Diagnosis not present

## 2021-05-09 ENCOUNTER — Ambulatory Visit (HOSPITAL_COMMUNITY)
Admission: RE | Admit: 2021-05-09 | Discharge: 2021-05-09 | Disposition: A | Discharge status: Home / Self Care | Payer: Medicare HMO | Source: Ambulatory Visit | Attending: Family Medicine | Admitting: Family Medicine

## 2021-05-09 ENCOUNTER — Other Ambulatory Visit: Payer: Self-pay

## 2021-05-09 ENCOUNTER — Other Ambulatory Visit (HOSPITAL_COMMUNITY): Payer: Self-pay | Admitting: Family Medicine

## 2021-05-09 DIAGNOSIS — M25551 Pain in right hip: Secondary | ICD-10-CM | POA: Diagnosis not present

## 2021-05-10 ENCOUNTER — Encounter (HOSPITAL_BASED_OUTPATIENT_CLINIC_OR_DEPARTMENT_OTHER): Payer: Self-pay | Admitting: Orthopedic Surgery

## 2021-05-10 ENCOUNTER — Other Ambulatory Visit: Payer: Self-pay

## 2021-05-11 ENCOUNTER — Other Ambulatory Visit (HOSPITAL_COMMUNITY): Payer: Self-pay | Admitting: Family Medicine

## 2021-05-11 DIAGNOSIS — M25551 Pain in right hip: Secondary | ICD-10-CM

## 2021-05-15 ENCOUNTER — Encounter (HOSPITAL_BASED_OUTPATIENT_CLINIC_OR_DEPARTMENT_OTHER)
Admission: RE | Admit: 2021-05-15 | Discharge: 2021-05-15 | Disposition: A | Discharge status: Home / Self Care | Payer: Medicare HMO | Source: Ambulatory Visit | Attending: Orthopedic Surgery | Admitting: Orthopedic Surgery

## 2021-05-15 DIAGNOSIS — Z01810 Encounter for preprocedural cardiovascular examination: Secondary | ICD-10-CM | POA: Insufficient documentation

## 2021-05-15 DIAGNOSIS — Z79890 Hormone replacement therapy (postmenopausal): Secondary | ICD-10-CM | POA: Diagnosis not present

## 2021-05-15 DIAGNOSIS — X58XXXA Exposure to other specified factors, initial encounter: Secondary | ICD-10-CM | POA: Diagnosis not present

## 2021-05-15 DIAGNOSIS — S66911A Strain of unspecified muscle, fascia and tendon at wrist and hand level, right hand, initial encounter: Secondary | ICD-10-CM | POA: Diagnosis not present

## 2021-05-15 DIAGNOSIS — M25731 Osteophyte, right wrist: Secondary | ICD-10-CM | POA: Diagnosis not present

## 2021-05-15 DIAGNOSIS — M19031 Primary osteoarthritis, right wrist: Secondary | ICD-10-CM | POA: Diagnosis not present

## 2021-05-15 DIAGNOSIS — Z79899 Other long term (current) drug therapy: Secondary | ICD-10-CM | POA: Diagnosis not present

## 2021-05-15 LAB — BASIC METABOLIC PANEL
Anion gap: 7 (ref 5–15)
BUN: 21 mg/dL (ref 8–23)
CO2: 25 mmol/L (ref 22–32)
Calcium: 9.6 mg/dL (ref 8.9–10.3)
Chloride: 104 mmol/L (ref 98–111)
Creatinine, Ser: 1.49 mg/dL — ABNORMAL HIGH (ref 0.44–1.00)
GFR, Estimated: 34 mL/min — ABNORMAL LOW (ref >60)
Glucose, Bld: 99 mg/dL (ref 70–99)
Potassium: 4.6 mmol/L (ref 3.5–5.1)
Sodium: 136 mmol/L (ref 135–145)

## 2021-05-15 NOTE — Progress Notes (Signed)
EKG and labs reviewed by Dr. Lissa Hoard and will proceed with surgery as scheduled at [facility].

## 2021-05-18 ENCOUNTER — Ambulatory Visit (HOSPITAL_BASED_OUTPATIENT_CLINIC_OR_DEPARTMENT_OTHER): Payer: Medicare HMO | Admitting: Anesthesiology

## 2021-05-18 ENCOUNTER — Other Ambulatory Visit: Payer: Self-pay

## 2021-05-18 ENCOUNTER — Encounter (HOSPITAL_BASED_OUTPATIENT_CLINIC_OR_DEPARTMENT_OTHER): Payer: Self-pay | Admitting: Orthopedic Surgery

## 2021-05-18 ENCOUNTER — Encounter (HOSPITAL_BASED_OUTPATIENT_CLINIC_OR_DEPARTMENT_OTHER)
Admission: RE | Disposition: A | Discharge status: Home / Self Care | Payer: Self-pay | Source: Home / Self Care | Attending: Orthopedic Surgery

## 2021-05-18 ENCOUNTER — Ambulatory Visit (HOSPITAL_BASED_OUTPATIENT_CLINIC_OR_DEPARTMENT_OTHER)
Admission: RE | Admit: 2021-05-18 | Discharge: 2021-05-18 | Disposition: A | Discharge status: Home / Self Care | Payer: Medicare HMO | Attending: Orthopedic Surgery | Admitting: Orthopedic Surgery

## 2021-05-18 DIAGNOSIS — Z79899 Other long term (current) drug therapy: Secondary | ICD-10-CM | POA: Diagnosis not present

## 2021-05-18 DIAGNOSIS — E785 Hyperlipidemia, unspecified: Secondary | ICD-10-CM | POA: Diagnosis not present

## 2021-05-18 DIAGNOSIS — X58XXXA Exposure to other specified factors, initial encounter: Secondary | ICD-10-CM | POA: Insufficient documentation

## 2021-05-18 DIAGNOSIS — S66911A Strain of unspecified muscle, fascia and tendon at wrist and hand level, right hand, initial encounter: Secondary | ICD-10-CM | POA: Diagnosis not present

## 2021-05-18 DIAGNOSIS — G8389 Other specified paralytic syndromes: Secondary | ICD-10-CM | POA: Diagnosis not present

## 2021-05-18 DIAGNOSIS — M19031 Primary osteoarthritis, right wrist: Secondary | ICD-10-CM | POA: Insufficient documentation

## 2021-05-18 DIAGNOSIS — M25731 Osteophyte, right wrist: Secondary | ICD-10-CM | POA: Diagnosis not present

## 2021-05-18 DIAGNOSIS — M19041 Primary osteoarthritis, right hand: Secondary | ICD-10-CM | POA: Diagnosis not present

## 2021-05-18 DIAGNOSIS — S66316A Strain of extensor muscle, fascia and tendon of right little finger at wrist and hand level, initial encounter: Secondary | ICD-10-CM | POA: Diagnosis not present

## 2021-05-18 DIAGNOSIS — Z79890 Hormone replacement therapy (postmenopausal): Secondary | ICD-10-CM | POA: Insufficient documentation

## 2021-05-18 DIAGNOSIS — M66831 Spontaneous rupture of other tendons, right forearm: Secondary | ICD-10-CM | POA: Diagnosis not present

## 2021-05-18 DIAGNOSIS — S66314A Strain of extensor muscle, fascia and tendon of right ring finger at wrist and hand level, initial encounter: Secondary | ICD-10-CM | POA: Diagnosis not present

## 2021-05-18 HISTORY — PX: TENDON REPAIR: SHX5111

## 2021-05-18 SURGERY — TENDON REPAIR
Anesthesia: Monitor Anesthesia Care | Site: Hand | Laterality: Right

## 2021-05-18 MED ORDER — CEFAZOLIN SODIUM-DEXTROSE 2-4 GM/100ML-% IV SOLN
INTRAVENOUS | Status: AC
  Filled 2021-05-18: qty 100

## 2021-05-18 MED ORDER — ONDANSETRON HCL 4 MG/2ML IJ SOLN
INTRAMUSCULAR | Status: AC
  Filled 2021-05-18: qty 2

## 2021-05-18 MED ORDER — 0.9 % SODIUM CHLORIDE (POUR BTL) OPTIME
TOPICAL | Status: DC | PRN
  Administered 2021-05-18: 100 mL

## 2021-05-18 MED ORDER — MIDAZOLAM HCL 2 MG/2ML IJ SOLN
INTRAMUSCULAR | Status: AC
  Filled 2021-05-18: qty 2

## 2021-05-18 MED ORDER — CEFAZOLIN SODIUM-DEXTROSE 2-4 GM/100ML-% IV SOLN
2.0000 g | INTRAVENOUS | Status: AC
  Administered 2021-05-18: 2 g via INTRAVENOUS

## 2021-05-18 MED ORDER — FENTANYL CITRATE (PF) 100 MCG/2ML IJ SOLN
INTRAMUSCULAR | Status: AC
  Filled 2021-05-18: qty 2

## 2021-05-18 MED ORDER — ONDANSETRON HCL 4 MG/2ML IJ SOLN: 4.0000 mg | Freq: Once | INTRAMUSCULAR | Status: DC | PRN

## 2021-05-18 MED ORDER — MIDAZOLAM HCL 2 MG/2ML IJ SOLN
2.0000 mg | Freq: Once | INTRAMUSCULAR | Status: AC
  Administered 2021-05-18: 0.5 mg via INTRAVENOUS

## 2021-05-18 MED ORDER — LACTATED RINGERS IV SOLN: INTRAVENOUS | Status: DC

## 2021-05-18 MED ORDER — ACETAMINOPHEN 500 MG PO TABS: 1000.0000 mg | ORAL_TABLET | Freq: Once | ORAL | Status: DC

## 2021-05-18 MED ORDER — PHENYLEPHRINE 40 MCG/ML (10ML) SYRINGE FOR IV PUSH (FOR BLOOD PRESSURE SUPPORT)
PREFILLED_SYRINGE | INTRAVENOUS | Status: AC
  Filled 2021-05-18: qty 10

## 2021-05-18 MED ORDER — EPHEDRINE 5 MG/ML INJ
INTRAVENOUS | Status: AC
  Filled 2021-05-18: qty 5

## 2021-05-18 MED ORDER — ACETAMINOPHEN 500 MG PO TABS
ORAL_TABLET | ORAL | Status: AC
  Filled 2021-05-18: qty 2

## 2021-05-18 MED ORDER — ROPIVACAINE HCL 5 MG/ML IJ SOLN
INTRAMUSCULAR | Status: DC | PRN
  Administered 2021-05-18: 20 mL via PERINEURAL

## 2021-05-18 MED ORDER — FENTANYL CITRATE (PF) 100 MCG/2ML IJ SOLN: 25.0000 ug | INTRAMUSCULAR | Status: DC | PRN

## 2021-05-18 MED ORDER — SUCCINYLCHOLINE CHLORIDE 200 MG/10ML IV SOSY
PREFILLED_SYRINGE | INTRAVENOUS | Status: AC
  Filled 2021-05-18: qty 10

## 2021-05-18 MED ORDER — ONDANSETRON HCL 4 MG/2ML IJ SOLN
INTRAMUSCULAR | Status: DC | PRN
  Administered 2021-05-18: 4 mg via INTRAVENOUS

## 2021-05-18 MED ORDER — LIDOCAINE 2% (20 MG/ML) 5 ML SYRINGE
INTRAMUSCULAR | Status: AC
  Filled 2021-05-18: qty 5

## 2021-05-18 MED ORDER — HYDROCODONE-ACETAMINOPHEN 5-325 MG PO TABS: ORAL_TABLET | ORAL | 0 refills | Status: DC

## 2021-05-18 MED ORDER — FENTANYL CITRATE (PF) 100 MCG/2ML IJ SOLN
100.0000 ug | Freq: Once | INTRAMUSCULAR | Status: AC
  Administered 2021-05-18: 50 ug via INTRAVENOUS

## 2021-05-18 MED ORDER — PROPOFOL 500 MG/50ML IV EMUL
INTRAVENOUS | Status: DC | PRN
  Administered 2021-05-18: 100 ug/kg/min via INTRAVENOUS

## 2021-05-18 SURGICAL SUPPLY — 83 items
APL PRP STRL LF DISP 70% ISPRP (MISCELLANEOUS) ×1
BALL CTTN LRG ABS STRL LF (GAUZE/BANDAGES/DRESSINGS)
BLADE AVERAGE 25X9 (BLADE) ×1 IMPLANT
BLADE MINI RND TIP GREEN BEAV (BLADE) IMPLANT
BLADE SURG 15 STRL LF DISP TIS (BLADE) ×2 IMPLANT
BLADE SURG 15 STRL SS (BLADE) ×4
BNDG CMPR 9X4 STRL LF SNTH (GAUZE/BANDAGES/DRESSINGS) ×1
BNDG CONFORM 2 STRL LF (GAUZE/BANDAGES/DRESSINGS) IMPLANT
BNDG ELASTIC 2X5.8 VLCR STR LF (GAUZE/BANDAGES/DRESSINGS) IMPLANT
BNDG ELASTIC 3X5.8 VLCR STR LF (GAUZE/BANDAGES/DRESSINGS) ×2 IMPLANT
BNDG ESMARK 4X9 LF (GAUZE/BANDAGES/DRESSINGS) ×2 IMPLANT
BNDG GAUZE ELAST 4 BULKY (GAUZE/BANDAGES/DRESSINGS) IMPLANT
CATH ROBINSON RED A/P 10FR (CATHETERS) IMPLANT
CHLORAPREP W/TINT 26 (MISCELLANEOUS) ×2 IMPLANT
CORD BIPOLAR FORCEPS 12FT (ELECTRODE) ×2 IMPLANT
COTTONBALL LRG STERILE PKG (GAUZE/BANDAGES/DRESSINGS) IMPLANT
COVER BACK TABLE 60X90IN (DRAPES) ×2 IMPLANT
COVER MAYO STAND STRL (DRAPES) ×2 IMPLANT
CUFF TOURN SGL QUICK 18X4 (TOURNIQUET CUFF) ×2 IMPLANT
DECANTER SPIKE VIAL GLASS SM (MISCELLANEOUS) IMPLANT
DRAPE EXTREMITY T 121X128X90 (DISPOSABLE) ×2 IMPLANT
DRAPE OEC MINIVIEW 54X84 (DRAPES) IMPLANT
DRAPE SURG 17X23 STRL (DRAPES) ×2 IMPLANT
DRSG PAD ABDOMINAL 8X10 ST (GAUZE/BANDAGES/DRESSINGS) IMPLANT
GAUZE 4X4 16PLY ~~LOC~~+RFID DBL (SPONGE) IMPLANT
GAUZE SPONGE 4X4 12PLY STRL (GAUZE/BANDAGES/DRESSINGS) ×2 IMPLANT
GAUZE XEROFORM 1X8 LF (GAUZE/BANDAGES/DRESSINGS) ×2 IMPLANT
GLOVE SRG 8 PF TXTR STRL LF DI (GLOVE) ×1 IMPLANT
GLOVE SURG ENC MOIS LTX SZ7 (GLOVE) ×2 IMPLANT
GLOVE SURG ENC MOIS LTX SZ7.5 (GLOVE) ×2 IMPLANT
GLOVE SURG ORTHO LTX SZ8 (GLOVE) ×1 IMPLANT
GLOVE SURG UNDER POLY LF SZ7 (GLOVE) ×3 IMPLANT
GLOVE SURG UNDER POLY LF SZ8 (GLOVE) ×2
GOWN STRL REUS W/ TWL LRG LVL3 (GOWN DISPOSABLE) ×1 IMPLANT
GOWN STRL REUS W/TWL LRG LVL3 (GOWN DISPOSABLE) ×2
GOWN STRL REUS W/TWL XL LVL3 (GOWN DISPOSABLE) ×3 IMPLANT
K-WIRE .035X4 (WIRE) IMPLANT
LOOP VESSEL MAXI BLUE (MISCELLANEOUS) IMPLANT
NDL HYPO 25X1 1.5 SAFETY (NEEDLE) IMPLANT
NDL KEITH (NEEDLE) IMPLANT
NEEDLE HYPO 25X1 1.5 SAFETY (NEEDLE) IMPLANT
NEEDLE KEITH (NEEDLE) IMPLANT
NS IRRIG 1000ML POUR BTL (IV SOLUTION) ×2 IMPLANT
PACK BASIN DAY SURGERY FS (CUSTOM PROCEDURE TRAY) ×2 IMPLANT
PAD CAST 3X4 CTTN HI CHSV (CAST SUPPLIES) ×1 IMPLANT
PAD CAST 4YDX4 CTTN HI CHSV (CAST SUPPLIES) IMPLANT
PADDING CAST ABS 3INX4YD NS (CAST SUPPLIES) ×1
PADDING CAST ABS 4INX4YD NS (CAST SUPPLIES) ×1
PADDING CAST ABS COTTON 3X4 (CAST SUPPLIES) IMPLANT
PADDING CAST ABS COTTON 4X4 ST (CAST SUPPLIES) ×1 IMPLANT
PADDING CAST COTTON 3X4 STRL (CAST SUPPLIES) ×2
PADDING CAST COTTON 4X4 STRL (CAST SUPPLIES)
SLEEVE SCD COMPRESS KNEE MED (STOCKING) ×1 IMPLANT
SPLINT PLASTER CAST XFAST 3X15 (CAST SUPPLIES) IMPLANT
SPLINT PLASTER XTRA FASTSET 3X (CAST SUPPLIES)
STOCKINETTE 4X48 STRL (DRAPES) ×2 IMPLANT
SUT CHROMIC 5 0 P 3 (SUTURE) IMPLANT
SUT ETHIBOND 3-0 V-5 (SUTURE) ×1 IMPLANT
SUT ETHILON 3 0 PS 1 (SUTURE) IMPLANT
SUT ETHILON 4 0 PS 2 18 (SUTURE) IMPLANT
SUT FIBERWIRE 3-0 18 TAPR NDL (SUTURE)
SUT FIBERWIRE 4-0 18 DIAM BLUE (SUTURE)
SUT MERSILENE 2.0 SH NDLE (SUTURE) IMPLANT
SUT MERSILENE 4 0 P 3 (SUTURE) IMPLANT
SUT MNCRL AB 4-0 PS2 18 (SUTURE) IMPLANT
SUT MON AB 5-0 PS2 18 (SUTURE) IMPLANT
SUT PROLENE 2 0 SH DA (SUTURE) IMPLANT
SUT PROLENE 6 0 P 1 18 (SUTURE) IMPLANT
SUT SILK 2 0 PERMA HAND 18 BK (SUTURE) IMPLANT
SUT SILK 4 0 PS 2 (SUTURE) IMPLANT
SUT SUPRAMID 4-0 (SUTURE) IMPLANT
SUT VIC AB 3-0 PS1 18 (SUTURE)
SUT VIC AB 3-0 PS1 18XBRD (SUTURE) IMPLANT
SUT VIC AB 4-0 P-3 18XBRD (SUTURE) IMPLANT
SUT VIC AB 4-0 P3 18 (SUTURE)
SUT VICRYL 4-0 PS2 18IN ABS (SUTURE) ×1 IMPLANT
SUTURE FIBERWR 3-0 18 TAPR NDL (SUTURE) IMPLANT
SUTURE FIBERWR 4-0 18 DIA BLUE (SUTURE) IMPLANT
SYR BULB EAR ULCER 3OZ GRN STR (SYRINGE) ×2 IMPLANT
SYR CONTROL 10ML LL (SYRINGE) IMPLANT
TOWEL GREEN STERILE FF (TOWEL DISPOSABLE) ×4 IMPLANT
TUBE FEEDING ENTERAL 5FR 16IN (TUBING) IMPLANT
UNDERPAD 30X36 HEAVY ABSORB (UNDERPADS AND DIAPERS) ×2 IMPLANT

## 2021-05-18 NOTE — Op Note (Addendum)
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  Right extensor indicis proprius to ring and small finger extensor digitorum communis tendon transfer 2.  Right distal ulna resection with stabilization with one half of extensor carpi ulnaris tendon   SURGEON:  [name], M.D.   ASSISTANT: Daryll Brod, MD   ANESTHESIA:  Regional with sedation   INTRAVENOUS FLUIDS:  Per anesthesia flow sheet.   ESTIMATED BLOOD LOSS:  Minimal.   COMPLICATIONS:  None.   SPECIMENS:  none   TOURNIQUET TIME:    Total Tourniquet Time Documented: Upper Arm (Right) - 79 minutes Total: Upper Arm (Right) - 79 minutes    DISPOSITION:  Stable to PACU.   INDICATIONS: 87 year old female has noted an inability to extend the ring and small fingers.  Radiographs show degenerative changes at the DRUJ.  She wishes to proceed with extensor indicis proprius to extensor digitorum communis tendon transfer and distal ulna resection with stabilization.  Risks, benefits and alternatives of surgery were discussed including the risks of blood loss, infection, damage to nerves, vessels, tendons, ligaments, bone for surgery, need for additional surgery, complications with wound healing, continued pain, stiffness.  She voiced understanding of these risks and elected to proceed.  OPERATIVE COURSE:  After being identified preoperatively by myself,  the patient and I agreed on the procedure and site of the procedure.  The surgical site was marked.  Surgical consent had been signed.  She was given IV antibiotics as preoperative antibiotic prophylaxis. She was transferred to the operating room and placed on the operating table in supine position with the Right upper extremity on an arm board.  Sedation was induced by the anesthesiologist. A regional block had been performed by anesthesia in preoperative holding.    Right upper extremity was prepped and draped in normal sterile orthopedic fashion.  A surgical pause was performed between the surgeons, anesthesia, and operating room staff and all were in agreement as to the patient, procedure, and site of procedure.  Tourniquet at the proximal aspect of the extremity was inflated to 250 mmHg after exsanguination of the arm with an Esmarch bandage.  Incision was made at the dorsum of the wrist over the distal radioulnar joint .  This was carried into subcutaneous tissues by spreading technique.  Bipolar electrocautery is used to obtain hemostasis.  The fourth dorsal compartment was opened.  The extensor tendons were visualized.  The ring and small finger extensor digitorum communis tendons were ruptured and not present.  The extensor digitorum communis to the long finger was frayed but largely intact.  The extensor digitorum communis and extensor indicis proprius for index finger were intact.  There was a hole in the capsule at the ulnar side of the compartment.  The ulnar head was visible within this hole.  There were osteophytes that would be prominent through the hole with rotation of the forearm.  A knife was used to open the capsule and periosteum over the distal end of the ulna.  The soft tissues were cleared from the distal ulna.  C-arm was used to localize appropriate  location for resection of the distal ulna.  The oscillating saw was used to cut the ulna at the base of the head.  The knife was then used to free the distal ulna from the surrounding soft tissues while protecting the extensor carpi ulnaris tendon.  He distal ulna was removed.   One half of the extensor digitorum communis tendon was harvested from proximally.  Distally based stump.  This was placed into the canal of the remaining ulna.  A 0.035 inch K wire was used to drill 2 holes in the dorsum of the ulna and a 3-0 Ethibond suture was passed through the hole and sutured into the extensor carpi ulnaris stump to pull it into the medullary canal to stabilize the distal ulna.  An incision was then made at the dorsum of the index finger of the MP joint.  This was carried into subcutaneous tissues by spreading technique.  The ulnar extensor tendon was isolated.  The extensor indicis proprius tendon was identified at the proximal wound by its distal muscle belly.  It was confirmed that this was the ulnar tendon at the MP joint.  The tendon was harvested with the scissors at the distal aspect and brought back into the proximal wound.  The distal wound was irrigated with sterile saline.  The stump of the extensor indicis proprius tendon was repaired over to the side of the extensor digitorum communis tendon using the Ethibond suture.  Skin was closed with 4-0 nylon in a horizontal mattress fashion.  Incision was made at the dorsum of the hand over the ring finger metacarpal.  This was carried in subcutaneous tissues by spreading technique.  The stumps of the extensor digitorum communis tendons to the ring and small fingers were identified.  The extensor digitorum communis to the long was intact.  The stumps were freed of scar tissue attachment.  The extensor indicis proprius tendon was then passed into this distal wound with a hemostat.  The wound at the wrist was irrigated with sterile saline.  The capsule was repaired back over top of the distal ulna with Vicryl suture in a figure-of-eight fashion.  The rent in the capsule was closed with the Vicryl suture as well.  The wound was closed with 4-0 nylon in a horizontal mattress fashion.  The extensor indicis proprius tendon was then woven through  the extensor digitorum communis to the ring and small fingers with the tendon braider and the wound on the dorsum of the hand.  Three passes were performed and then the remaining tendon folded back onto itself.  These were all secured with a 3-0 Ethibond suture.  The bulbous stumps of the proximal aspect of the extensor digitorum communis to the ring and small were removed.  The wrist was placed through tenodesis and there is good cascade of the digits.  The wound was copiously irrigated and closed with 4-0 nylon in a horizontal mattress fashion.  The wounds were all dressed with sterile Xeroform and 4 x 4's and wrapped with a Kerlix bandage.  A sugar-tong splint was placed coming out to the tips of the fingers volarly to keep the fingers in extension and the forearm in slight pronation.  This was wrapped with Kerlix and Ace bandage.  The tourniquet was deflated at 79 minutes.  Fingertips were pink with brisk capillary refill after deflation of tourniquet.  The operative  drapes were broken down.  The patient was awoken from anesthesia safely.  She was transferred back to the  stretcher and taken to PACU in stable condition.  I will see her back in the office in 1 week for postoperative followup.  I will give her a prescription for Norco 5/325 1 tab PO q6 hours prn pain, dispense # [id number].   [name], MD Electronically signed, 05/18/21

## 2021-05-18 NOTE — Anesthesia Procedure Notes (Signed)
Anesthesia Regional Block: Supraclavicular block   Pre-Anesthetic Checklist: , timeout performed,  Correct Patient, Correct Site, Correct Laterality,  Correct Procedure, Correct Position, site marked,  Risks and benefits discussed,  Surgical consent,  Pre-op evaluation,  At surgeon's request and post-op pain management  Laterality: Right  Prep: Maximum Sterile Barrier Precautions used, chloraprep       Needles:  Injection technique: Single-shot  Needle Type: Echogenic Stimulator Needle     Needle Length: 9cm  Needle Gauge: 22     Additional Needles:   Procedures:,,,, ultrasound used (permanent image in chart),,    Narrative:  Start time: 05/18/2021 12:00 PM End time: 05/18/2021 12:05 PM Injection made incrementally with aspirations every 5 mL.  Performed by: Personally  Anesthesiologist: Pervis Hocking, DO  Additional Notes: Monitors applied. No increased pain on injection. No increased resistance to injection. Injection made in 5cc increments. Good needle visualization. Patient tolerated procedure well.

## 2021-05-18 NOTE — Discharge Instructions (Addendum)
[facility] Instructions Hand Surgery  Wound Care: Keep your hand elevated above the level of your heart.  Do not allow it to dangle by your side.  Keep the dressing dry and do not remove it unless your doctor advises you to do so.  He will usually change it at the time of your post-op visit.  Moving your fingers is advised to stimulate circulation but will depend on the site of your surgery.  If you have a splint applied, your doctor will advise you regarding movement.  Activity: Do not drive or operate machinery today.  Rest today and then you may return to your normal activity and work as indicated by your physician.  Diet:  Drink liquids today or eat a light diet.  You may resume a regular diet tomorrow.    General expectations: Pain for two to three days. Fingers may become slightly swollen.  Call your doctor if any of the following occur: Severe pain not relieved by pain medication. Elevated temperature. Dressing soaked with blood. Inability to move fingers. White or bluish color to fingers.   Post Anesthesia Home Care Instructions  Activity: Get plenty of rest for the remainder of the day. A responsible individual must stay with you for 24 hours following the procedure.  For the next 24 hours, DO NOT: -Drive a car -Operate machinery -Drink alcoholic beverages -Take any medication unless instructed by your physician -Make any legal decisions or sign important papers.  Meals: Start with liquid foods such as gelatin or soup. Progress to regular foods as tolerated. Avoid greasy, spicy, heavy foods. If nausea and/or vomiting occur, drink only clear liquids until the nausea and/or vomiting subsides. Call your physician if vomiting continues.  Special Instructions/Symptoms: Your throat may feel dry or sore from the anesthesia or the breathing tube placed in your throat during surgery. If this causes discomfort, gargle with warm salt water. The discomfort should disappear within  24 hours.     Regional Anesthesia Blocks  1. Numbness or the inability to move the "blocked" extremity may last from 3-48 hours after placement. The length of time depends on the medication injected and your individual response to the medication. If the numbness is not going away after 48 hours, call your surgeon.  2. The extremity that is blocked will need to be protected until the numbness is gone and the  Strength has returned. Because you cannot feel it, you will need to take extra care to avoid injury. Because it may be weak, you may have difficulty moving it or using it. You may not know what position it is in without looking at it while the block is in effect.  3. For blocks in the legs and feet, returning to weight bearing and walking needs to be done carefully. You will need to wait until the numbness is entirely gone and the strength has returned. You should be able to move your leg and foot normally before you try and bear weight or walk. You will need someone to be with you when you first try to ensure you do not fall and possibly risk injury.  4. Bruising and tenderness at the needle site are common side effects and will resolve in a few days.  5. Persistent numbness or new problems with movement should be communicated to the surgeon or the [facility] ([phone])/ [facility] ([phone]).  

## 2021-05-18 NOTE — Op Note (Signed)
I assisted Surgeon(s) and Role:    * [name], MD - Primary    * Daryll Brod, MD on the Procedure(s): RIGHT RING AND RIGHT SMALL TENDON TRANSFER VS EXTENSOR INDICIS PROPRIUS TO RING AND SMALL FINGER TRANSFER; DISTAL RADIOULNAR JOINT DEBRIDEMENT ULNA  DISTAL RESECTION WITH STABILIZATION on 05/18/2021.  I provided assistance on this case as follows: Set up, approach, opening of the distal radial ulnar joint with visualization of the ruptured tendons at the DRUJ, identification of the distal ulna head arthritic changes.  Extension of the wound with removal of the distal ulna head, transfer of the ECU tendon to the ulnar for stabilization, identification of the ruptured ring and small finger extensor tendons, identification of the extensor indices proprius proximally distally with harvesting of the tendon for transfer, transfer of the extensor indices proprius to the extensor digitorum communis to the ring and small fingers.  This was followed by irrigation closure of the wounds and application of dorsal palmar [place] splint.  Electronically signed by: Daryll Brod, MD Date: 05/18/2021 Time: 2:44 PM

## 2021-05-18 NOTE — Transfer of Care (Signed)
Immediate Anesthesia Transfer of Care Note  Patient: Brittany Holden [facility]  Procedure(s) Performed: RIGHT RING AND RIGHT SMALL TENDON TRANSFER VS EXTENSOR INDICIS PROPRIUS TO RING AND SMALL FINGER TRANSFER; DISTAL RADIOULNAR JOINT DEBRIDEMENT ULNA  DISTAL RESECTION WITH STABILIZATION (Right: Hand)  Patient Location: PACU  Anesthesia Type:MAC combined with regional for post-op pain  Level of Consciousness: awake, alert  and oriented  Airway & Oxygen Therapy: Patient Spontanous Breathing and Patient connected to face mask oxygen  Post-op Assessment: Report given to RN and Post -op Vital signs reviewed and stable  Post vital signs: Reviewed and stable  Last Vitals:  Vitals Value Taken Time  BP 126/74 05/18/21 1445  Temp    Pulse 90 05/18/21 1445  Resp 19 05/18/21 1445  SpO2 95 % 05/18/21 1445  Vitals shown include unvalidated device data.  Last Pain:  Vitals:   05/18/21 1106  TempSrc: Oral  PainSc: 0-No pain      Patients Stated Pain Goal: 4 (83/50/75 7322)  Complications: No notable events documented.

## 2021-05-18 NOTE — Progress Notes (Signed)
Assisted Dr. Doroteo Glassman with right, ultrasound guided, supraclavicular block. Side rails up, monitors on throughout procedure. See vital signs in flow sheet. Tolerated Procedure well.

## 2021-05-18 NOTE — Anesthesia Preprocedure Evaluation (Addendum)
Anesthesia Evaluation  Patient identified by MRN, date of birth, ID band Patient awake    Reviewed: Allergy & Precautions, NPO status , Patient's Chart, lab work & pertinent test results, reviewed documented beta blocker date and time   Airway Mallampati: II  TM Distance: >3 FB Neck ROM: Full    Dental  (+) Edentulous Upper, Dental Advisory Given, Missing,    Pulmonary neg pulmonary ROS,    Pulmonary exam normal breath sounds clear to auscultation       Cardiovascular hypertension, Pt. on medications and Pt. on home beta blockers Normal cardiovascular exam Rhythm:Regular Rate:Normal     Neuro/Psych negative neurological ROS  negative psych ROS   GI/Hepatic negative GI ROS, Neg liver ROS,   Endo/Other  Hypothyroidism   Renal/GU CRFCr 1.49, CKD 3  negative genitourinary   Musculoskeletal Extensor tendon rupture right ring and right small fingers    Abdominal   Peds  Hematology negative hematology ROS (+)   Anesthesia Other Findings   Reproductive/Obstetrics negative OB ROS                            Anesthesia Physical Anesthesia Plan  ASA: 3  Anesthesia Plan: Regional and MAC   Post-op Pain Management:    Induction: Intravenous  PONV Risk Score and Plan: Treatment may vary due to age or medical condition, Propofol infusion and TIVA  Airway Management Planned: Natural Airway and Simple Face Mask  Additional Equipment: None  Intra-op Plan:   Post-operative Plan:   Informed Consent: I have reviewed the patients History and Physical, chart, labs and discussed the procedure including the risks, benefits and alternatives for the proposed anesthesia with the patient or authorized representative who has indicated his/her understanding and acceptance.       Plan Discussed with: CRNA  Anesthesia Plan Comments:        Anesthesia Quick Evaluation

## 2021-05-18 NOTE — Anesthesia Postprocedure Evaluation (Signed)
Anesthesia Post Note  Patient: Suzanna Obey [facility]  Procedure(s) Performed: RIGHT RING AND RIGHT SMALL TENDON TRANSFER VS EXTENSOR INDICIS PROPRIUS TO RING AND SMALL FINGER TRANSFER; DISTAL RADIOULNAR JOINT DEBRIDEMENT ULNA  DISTAL RESECTION WITH STABILIZATION (Right: Hand)     Patient location during evaluation: PACU Anesthesia Type: Regional and MAC Level of consciousness: awake and alert Pain management: pain level controlled Vital Signs Assessment: post-procedure vital signs reviewed and stable Respiratory status: spontaneous breathing, nonlabored ventilation and respiratory function stable Cardiovascular status: blood pressure returned to baseline and stable Postop Assessment: no apparent nausea or vomiting Anesthetic complications: no   No notable events documented.  Last Vitals:  Vitals:   05/18/21 1445 05/18/21 1459  BP: 126/74 125/73  Pulse: 90 72  Resp: 19 15  Temp: 36.6 C   SpO2: 95% 93%    Last Pain:  Vitals:   05/18/21 1445  TempSrc:   PainSc: 0-No pain                 Pervis Hocking

## 2021-05-18 NOTE — H&P (Signed)
Brittany Holden is an 87 y.o. female.   Chief Complaint: Extensor tendon rupture HPI: 87 year old female with rupture of right ring and small finger extensor tendons.  She is unable to extend the ring and small fingers.  She wishes to proceed with operative tendon transfer with EIP transfer for recovery of extension as well as DRUJ debridement versus distal ulna resection with stabilization to prevent further tendon rupture.  Allergies: No Known Allergies  Past Medical History:  Diagnosis Date   Arrhythmia    Diverticulosis of colon (without mention of hemorrhage)    Hypothyroidism    IBS (irritable bowel syndrome)    Neuropathy    Personal history of colonic polyps 1999 & 2007   hyperplastic     Past Surgical History:  Procedure Laterality Date   ABDOMINAL HYSTERECTOMY     FOOT ARTHRODESIS     pipj digits  2-4 left foot   TONSILLECTOMY  06/26/2012   Procedure: TONSILLECTOMY;  Surgeon: Ascencion Dike, MD;  Location: [facility];  Service: ENT;  Laterality: Right;   VAGINAL HYSTERECTOMY      Family History: Family History  Problem Relation Age of Onset   CAD Neg Hx    Breast cancer Neg Hx     Social History:   reports that she has never smoked. She has never used smokeless tobacco. She reports that she does not drink alcohol and does not use drugs.  Medications: Medications Prior to Admission  Medication Sig Dispense Refill   chlorthalidone (HYGROTON) 25 MG tablet Take 25 mg by mouth every other day.     Cholecalciferol 25 MCG (1000 UT) capsule Take 1,000 Units by mouth daily.     Cyanocobalamin (VITAMIN B-12 CR PO) Take by mouth.     levothyroxine (SYNTHROID, LEVOTHROID) 75 MCG tablet Take 75 mcg by mouth daily.       metoprolol succinate (TOPROL XL) 25 MG 24 hr tablet Take 1 tablet (25 mg total) by mouth daily. 90 tablet 3   simvastatin (ZOCOR) 20 MG tablet Take 20 mg by mouth every morning.      Multiple Minerals-Vitamins (CALCIUM & VIT D3 BONE HEALTH PO) Take 1 tablet by  mouth daily.      No results found for this or any previous visit (from the past 48 hour(s)).  No results found.     Blood pressure (!) 145/70, pulse 89, temperature 98.1 F (36.7 C), temperature source Oral, resp. rate 15, height 6' (1.829 m), weight 59.9 kg, SpO2 97 %.  General appearance: alert, cooperative, and appears stated age Head: Normocephalic, without obvious abnormality, atraumatic Neck: supple, symmetrical, trachea midline Cardio: regular rate and rhythm Resp: clear to auscultation bilaterally Extremities: Intact sensation and capillary refill all digits.  +epl/fpl/io.  No wounds.  She is unable to extend the ring and small fingers. Pulses: 2+ and symmetric Skin: Skin color, texture, turgor normal. 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  I discussed with [name] the nature of the condition.  We discussed treatment of the extensor tendon ruptures with transfer of the extensor tendon to the adjacent EDC along with EIP to [name] transfer.  We also discussed prevention of further tendon rupture by debridement of the DRUJ versus distal ulna resection with stabilization.  She wishes to proceed with tendon reconstruction and distal ulna resection to try to prevent further tendon rupture. Non operative and operative treatment options have been  discussed with the patient and patient wishes to proceed with operative treatment. Risks, benefits, and alternatives of surgery have been discussed and the patient agrees with the plan of care.   Brittany Holden 05/18/2021, 11:30 AM

## 2021-05-21 ENCOUNTER — Emergency Department (HOSPITAL_COMMUNITY)
Admission: EM | Admit: 2021-05-21 | Discharge: 2021-05-21 | Disposition: A | Discharge status: Home / Self Care | Payer: Medicare HMO | Attending: Emergency Medicine | Admitting: Emergency Medicine

## 2021-05-21 ENCOUNTER — Emergency Department (HOSPITAL_COMMUNITY): Payer: Medicare HMO

## 2021-05-21 ENCOUNTER — Other Ambulatory Visit: Payer: Self-pay

## 2021-05-21 ENCOUNTER — Encounter (HOSPITAL_COMMUNITY): Payer: Self-pay

## 2021-05-21 DIAGNOSIS — M545 Low back pain, unspecified: Secondary | ICD-10-CM | POA: Diagnosis not present

## 2021-05-21 DIAGNOSIS — Z79899 Other long term (current) drug therapy: Secondary | ICD-10-CM | POA: Diagnosis not present

## 2021-05-21 DIAGNOSIS — N183 Chronic kidney disease, stage 3 unspecified: Secondary | ICD-10-CM | POA: Diagnosis not present

## 2021-05-21 DIAGNOSIS — S32511A Fracture of superior rim of right pubis, initial encounter for closed fracture: Secondary | ICD-10-CM | POA: Diagnosis not present

## 2021-05-21 DIAGNOSIS — S32591A Other specified fracture of right pubis, initial encounter for closed fracture: Secondary | ICD-10-CM | POA: Diagnosis not present

## 2021-05-21 DIAGNOSIS — Y9301 Activity, walking, marching and hiking: Secondary | ICD-10-CM | POA: Diagnosis not present

## 2021-05-21 DIAGNOSIS — Y92510 Bank as the place of occurrence of the external cause: Secondary | ICD-10-CM | POA: Insufficient documentation

## 2021-05-21 DIAGNOSIS — S79911A Unspecified injury of right hip, initial encounter: Secondary | ICD-10-CM | POA: Diagnosis present

## 2021-05-21 DIAGNOSIS — W01198A Fall on same level from slipping, tripping and stumbling with subsequent striking against other object, initial encounter: Secondary | ICD-10-CM | POA: Insufficient documentation

## 2021-05-21 DIAGNOSIS — E039 Hypothyroidism, unspecified: Secondary | ICD-10-CM | POA: Diagnosis not present

## 2021-05-21 DIAGNOSIS — S32501A Unspecified fracture of right pubis, initial encounter for closed fracture: Secondary | ICD-10-CM | POA: Insufficient documentation

## 2021-05-21 DIAGNOSIS — M810 Age-related osteoporosis without current pathological fracture: Secondary | ICD-10-CM | POA: Diagnosis not present

## 2021-05-21 DIAGNOSIS — I129 Hypertensive chronic kidney disease with stage 1 through stage 4 chronic kidney disease, or unspecified chronic kidney disease: Secondary | ICD-10-CM | POA: Diagnosis not present

## 2021-05-21 DIAGNOSIS — S3210XA Unspecified fracture of sacrum, initial encounter for closed fracture: Secondary | ICD-10-CM | POA: Diagnosis not present

## 2021-05-21 DIAGNOSIS — M25551 Pain in right hip: Secondary | ICD-10-CM | POA: Diagnosis not present

## 2021-05-21 DIAGNOSIS — Q7649 Other congenital malformations of spine, not associated with scoliosis: Secondary | ICD-10-CM | POA: Diagnosis not present

## 2021-05-21 MED ORDER — LIDOCAINE 5 % EX PTCH: 1.0000 | MEDICATED_PATCH | CUTANEOUS | 0 refills | Status: DC

## 2021-05-21 MED ORDER — LIDOCAINE 5 % EX PTCH
1.0000 | MEDICATED_PATCH | CUTANEOUS | Status: DC
  Administered 2021-05-21: 1 via TRANSDERMAL

## 2021-05-21 NOTE — ED Triage Notes (Signed)
Pt to er room number 3 via wc, pt states that she hurt her hand a about a month ago and she had surgery on them, states that about three weeks ago she fell coming [place] of the bank, states that since then she has had some R hip pain. Pt states that it hurts to walk.

## 2021-05-21 NOTE — Discharge Instructions (Addendum)
The x-ray did show a break in the pelvis bone on the right side.  This break does not require any surgery or further treatment and it should slowly heal up on its own.  Continue to take your hydrocodone medication as needed for pain.  Try the lidocaine patch as well.  Consider following up with an orthopedic doctor or primary care doctor to discuss further treatment options if the pain persists  I prescribed lidocaine patches to assist with the pain.  If there is difficulty getting this prescription because of insurance reasons you can try over-the-counter topical lidocaine patches, Salonpas is a brand name

## 2021-05-21 NOTE — ED Provider Notes (Signed)
[facility] EMERGENCY DEPARTMENT Provider Note   CSN: [id number] Arrival date & time: 05/21/21  [id number]     History Chief Complaint  Patient presents with   Brittany Holden is a 87 y.o. female.   Fall   Patient presents the ED for evaluation of right hip pain.  Patient states she had a fall maybe a couple of weeks ago.  Patient was walking into the [place] when she tripped and stumbled.  She did hit her head.  Patient was able to get up and walk with some assistance.  Since that time she has had some mild comfort right hip and upper leg walking.  In the last couple of days it has worsened.  No new falls.  No numbness or weakness.  The pain is sharp in the right hip but goes to the upper thigh.    Past Medical History:  Diagnosis Date   Arrhythmia    Diverticulosis of colon (without mention of hemorrhage)    Hypothyroidism    IBS (irritable bowel syndrome)    Neuropathy    Personal history of colonic polyps 1999 & 2007   hyperplastic     Patient Active Problem List   Diagnosis Date Noted   Chest pain with high risk for cardiac etiology 11/11/2014   Chest pain 11/11/2014   CKD (chronic kidney disease) stage 3, GFR 30-59 ml/min ([place]) 11/11/2014   Essential hypertension 11/11/2014   Hyperlipidemia 11/11/2014    Past Surgical History:  Procedure Laterality Date   ABDOMINAL HYSTERECTOMY     FOOT ARTHRODESIS     pipj digits  2-4 left foot   TONSILLECTOMY  06/26/2012   Procedure: TONSILLECTOMY;  Surgeon: Ascencion Dike, MD;  Location: [facility];  Service: ENT;  Laterality: Right;   VAGINAL HYSTERECTOMY       OB History     Gravida  1   Para  1   Term  1   Preterm  0   AB  0   Living         SAB  0   IAB  0   Ectopic  0   Multiple      Live Births              Family History  Problem Relation Age of Onset   CAD Neg Hx    Breast cancer Neg Hx     Social History   Tobacco Use   Smoking status: Never   Smokeless tobacco: Never  Vaping  Use   Vaping Use: Never used  Substance Use Topics   Alcohol use: No   Drug use: No    Home Medications Prior to Admission medications   Medication Sig Start Date End Date Taking? Authorizing Provider  lidocaine (LIDODERM) 5 % Place 1 patch onto the skin daily. Remove & Discard patch within 12 hours or as directed by MD 05/21/21  Yes Dorie Rank, MD  chlorthalidone (HYGROTON) 25 MG tablet Take 25 mg by mouth every other day. 11/02/20 11/02/21  [provider]  Cholecalciferol 25 MCG (1000 UT) capsule Take 1,000 Units by mouth daily. 08/31/20 08/31/21  [provider]  Cyanocobalamin (VITAMIN B-12 CR PO) Take by mouth.    [provider]  HYDROcodone-acetaminophen (NORCO) 5-325 MG tablet 1 tab po q6 hours prn pain 05/18/21   [name], MD  levothyroxine (SYNTHROID, LEVOTHROID) 75 MCG tablet Take 75 mcg by mouth daily.      [provider]  metoprolol succinate (TOPROL XL) 25 MG 24 hr tablet Take 1 tablet (25 mg total) by mouth daily. 04/20/20   Fay Records, MD  Multiple Minerals-Vitamins (CALCIUM & VIT D3 BONE HEALTH PO) Take 1 tablet by mouth daily.    [provider]  simvastatin (ZOCOR) 20 MG tablet Take 20 mg by mouth every morning.     [provider]    Allergies    Patient has no known allergies.  Review of Systems   Review of Systems  All other systems reviewed and are negative.  Physical Exam Updated Vital Signs BP (!) 148/63   Pulse (!) 53   Temp 97.9 F (36.6 C) (Oral)   Resp 17   Ht 1.829 m (6')   Wt 58.1 kg   SpO2 97%   BMI 17.36 kg/m   Physical Exam Vitals and nursing note reviewed.  Constitutional:      General: She is not in acute distress.    Appearance: She is well-developed.  HENT:     Head: Normocephalic and atraumatic.     Right Ear: External ear normal.     Left Ear: External ear normal.  Eyes:     General: No scleral icterus.       Right eye: No discharge.        Left eye: No discharge.      Conjunctiva/sclera: Conjunctivae normal.  Neck:     Trachea: No tracheal deviation.  Cardiovascular:     Rate and Rhythm: Normal rate.  Pulmonary:     Effort: Pulmonary effort is normal. No respiratory distress.     Breath sounds: No stridor.  Abdominal:     General: There is no distension.  Musculoskeletal:        General: No swelling or deformity.     Cervical back: Neck supple.     Lumbar back: No tenderness. Normal range of motion.     Right hip: No deformity, tenderness or bony tenderness.  Skin:    General: Skin is warm and dry.     Findings: No rash.  Neurological:     Mental Status: She is alert.     Cranial Nerves: Cranial nerve deficit: no gross deficits.    ED Results / Procedures / Treatments   Labs (all labs ordered are listed, but only abnormal results are displayed) Labs Reviewed - No data to display  EKG None  Radiology DG Lumbar Spine Complete  Result Date: 05/21/2021 CLINICAL DATA:  Fall.  Pain. EXAM: LUMBAR SPINE - COMPLETE 4+ VIEW COMPARISON:  None. FINDINGS: Convex leftward thoracolumbar scoliosis evident. No findings to suggest acute fracture. Trace anterolisthesis noted L3 on 4. Facet degeneration noted lower lumbar spine. SI joints unremarkable. IMPRESSION: 1. No acute bony abnormality. 2. Degenerative changes in the lower lumbar spine. Electronically Signed   By: Misty Stanley M.D.   On: 05/21/2021 09:01   CT PELVIS WO CONTRAST  Result Date: 05/21/2021 CLINICAL DATA:  Persistent right hip pain, fall 3 weeks ago. EXAM: CT PELVIS WITHOUT CONTRAST TECHNIQUE: Multidetector CT imaging of the pelvis was performed following the standard protocol without intravenous contrast. COMPARISON:  Hip CT 05/21/2021 FINDINGS: Urinary Tract: The distal ureters an urinary bladder appear unremarkable. Bowel:  Prominent stool throughout the colon favors constipation. Vascular/Lymphatic: Atherosclerosis is present, including aortoiliac atherosclerotic disease. No pathologic  adenopathy identified. Reproductive: 5.1 by 3.1 by 5.1 cm left adnexal cystic lesion, questionable slight lateral wall thickening or sessile nodularity along the lateral margin on  image 67 series 8. Because of the not definitively simple nature of this cystic lesion, prompt pelvic sonographic assessment is recommended. Uterus absent. Other:  No supplemental non-categorized findings. Musculoskeletal: As noted on the hip CT, there is a band of sclerosis posteriorly in the right inferior pubic ramus with adjacent periostitis compatible with healing nondisplaced fracture or stress fracture. I do not discern a definite fracture of the superior pubic ramus or of the sacrum. There is a transitional lumbosacral vertebra. No other acute pelvic fracture is observed. Suspected mild right trochanteric bursitis. 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 There also some faint calcifications along the margin of this lesion which are somewhat atypical for Tarlov cyst, query remote local inflammation. IMPRESSION: 1. Healing nondisplaced fracture or stress fracture in the right inferior pubic ramus posteriorly. No other pelvic fractures are identified, although the pubic ring is frequently fractured in concert with fracture of the other pubic ramus and often of the sacrum. Nuclear medicine bone scan or MRI could be utilized to assess for other occult fractures. 2. Somewhat atypical 3.1 cm cystic lesion along the upper sacral canal eccentric to the right favors a Tarlov cysts but has unusual thin calcification along some of its margins. Presumably this could occur from remote local inflammation. This could likewise be further investigated with pelvic or sacral MRI. 3. Cystic lesion of the left ovary/adnexa is not definitively simple based on questionable lateral wall thickening or sessile nodularity. Pelvic sonography is recommended for further assessment. 4.  Aortic  Atherosclerosis (ICD10-I70.0). Electronically Signed   By: Van Clines M.D.   On: 05/21/2021 10:50   CT Hip Right Wo Contrast  Result Date: 05/21/2021 CLINICAL DATA:  Right hip injury, fall 3 weeks ago, right hip pain. Pain with walking. EXAM: CT OF THE RIGHT HIP WITHOUT CONTRAST TECHNIQUE: Multidetector CT imaging of the right hip was performed according to the standard protocol. Multiplanar CT image reconstructions were also generated. COMPARISON:  Multiple exams, including Radiographs 05/21/2021 FINDINGS: Bones/Joint/Cartilage No fracture the right proximal femur is identified. However, there is a band of sclerosis in the right inferior pubic ramus posteriorly on images 57 through 65 of series 3 suspicious for a nondisplaced fracture or stress fracture. The remainder the bony pelvis will be assessed at the dedicated in the dedicated pelvic CT. There is evidence of right trochanteric bursitis. No definite right hip joint effusion. Ligaments Suboptimally assessed by CT. Muscles and Tendons Unremarkable Soft tissues Arterial atherosclerosis. IMPRESSION: 1. Nondisplaced healing fracture or stress fracture of the posterior portion of the left inferior pubic ramus. The remainder the bony pelvis will be assessed on the dedicated pelvic CT. 2. Right trochanteric bursitis. 3. Atherosclerosis. Electronically Signed   By: Van Clines M.D.   On: 05/21/2021 10:36   DG Hip Unilat W or Wo Pelvis 2-3 Views Right  Result Date: 05/21/2021 CLINICAL DATA:  Fall, pain EXAM: DG HIP (WITH OR WITHOUT PELVIS) 2-3V RIGHT COMPARISON:  Pelvis radiographs 05/09/2021 FINDINGS: The bones are diffusely demineralized. There is no acute fracture or dislocation. Femoroacetabular alignment is maintained bilaterally. The SI joints and symphysis pubis are intact. IMPRESSION: No acute fracture or dislocation. Please note that plain radiographs are significantly insensitive for hip and pelvic fracture in the setting of osteopenia.  Consider CT or MRI to most sensitively assess for fracture if there is high clinical suspicion. Electronically Signed   By: Valetta Mole M.D.   On:  05/21/2021 09:03    Procedures Procedures   Medications Ordered in ED Medications  lidocaine (LIDODERM) 5 % 1 patch (1 patch Transdermal Patch Applied 05/21/21 0948)    ED Course  I have reviewed the triage vital signs and the nursing notes.  Pertinent labs & imaging results that were available during my care of the patient were reviewed by me and considered in my medical decision making (see chart for details).  Clinical Course as of 05/21/21 1107  Sun May 21, 2021  0923 Discussed findings with patient.  Family states she is scheduled to have a CT scan of her pelvis and hip.  Request that be done today if possible as the appointment is not until October [JK]    Clinical Course User Index [JK] Dorie Rank, MD   MDM Rules/Calculators/A&P                           Patient presented with pain in her groin and thigh area.  Patient did fall couple weeks ago.  Family was able to provide additional history and it sounds like she did see her primary care doctor and was scheduled for a CT scan to evaluate further.  Plain films did not show any signs of fracture.  Patient is neurologically intact.  No signs to suggest infection or neurovascular compromise.  CT scan was performed and it does show evidence of a healing pubic rami fracture.  This correlates with the patient's symptoms.  She does have hydrocodone for pain.  She is intermittently taking Tylenol.  I will add lidocaine patches.  Outpatient follow-up with orthopedics as needed Final Clinical Impression(s) / ED Diagnoses Final diagnoses:  Closed fracture of ramus of right pubis, initial encounter ([place])    Rx / DC Orders ED Discharge Orders          Ordered    lidocaine (LIDODERM) 5 %  Every 24 hours        05/21/21 1105             Dorie Rank, MD 05/21/21 1108

## 2021-05-22 ENCOUNTER — Encounter (HOSPITAL_BASED_OUTPATIENT_CLINIC_OR_DEPARTMENT_OTHER): Payer: Self-pay | Admitting: Orthopedic Surgery

## 2021-05-23 ENCOUNTER — Emergency Department (HOSPITAL_COMMUNITY)
Admission: EM | Admit: 2021-05-23 | Discharge: 2021-05-23 | Disposition: A | Discharge status: Home / Self Care | Payer: Medicare HMO | Attending: Emergency Medicine | Admitting: Emergency Medicine

## 2021-05-23 ENCOUNTER — Emergency Department (HOSPITAL_COMMUNITY): Payer: Medicare HMO

## 2021-05-23 ENCOUNTER — Other Ambulatory Visit: Payer: Self-pay

## 2021-05-23 ENCOUNTER — Emergency Department (HOSPITAL_BASED_OUTPATIENT_CLINIC_OR_DEPARTMENT_OTHER)
Admit: 2021-05-23 | Discharge: 2021-05-23 | Disposition: A | Discharge status: Home / Self Care | Payer: Medicare HMO | Attending: Emergency Medicine | Admitting: Emergency Medicine

## 2021-05-23 DIAGNOSIS — W19XXXA Unspecified fall, initial encounter: Secondary | ICD-10-CM | POA: Insufficient documentation

## 2021-05-23 DIAGNOSIS — E039 Hypothyroidism, unspecified: Secondary | ICD-10-CM | POA: Diagnosis not present

## 2021-05-23 DIAGNOSIS — M79651 Pain in right thigh: Secondary | ICD-10-CM | POA: Diagnosis not present

## 2021-05-23 DIAGNOSIS — M79604 Pain in right leg: Secondary | ICD-10-CM | POA: Diagnosis not present

## 2021-05-23 DIAGNOSIS — I129 Hypertensive chronic kidney disease with stage 1 through stage 4 chronic kidney disease, or unspecified chronic kidney disease: Secondary | ICD-10-CM | POA: Insufficient documentation

## 2021-05-23 DIAGNOSIS — Z79899 Other long term (current) drug therapy: Secondary | ICD-10-CM | POA: Insufficient documentation

## 2021-05-23 DIAGNOSIS — S32501A Unspecified fracture of right pubis, initial encounter for closed fracture: Secondary | ICD-10-CM | POA: Diagnosis not present

## 2021-05-23 DIAGNOSIS — N183 Chronic kidney disease, stage 3 unspecified: Secondary | ICD-10-CM | POA: Insufficient documentation

## 2021-05-23 DIAGNOSIS — Y92510 Bank as the place of occurrence of the external cause: Secondary | ICD-10-CM | POA: Insufficient documentation

## 2021-05-23 MED ORDER — OXYCODONE-ACETAMINOPHEN 5-325 MG PO TABS: 1.0000 | ORAL_TABLET | Freq: Four times a day (QID) | ORAL | 0 refills | Status: DC | PRN

## 2021-05-23 MED ORDER — OXYCODONE-ACETAMINOPHEN 5-325 MG PO TABS
1.0000 | ORAL_TABLET | Freq: Once | ORAL | Status: AC
Start: 2021-05-23 — End: 2021-05-23
  Administered 2021-05-23: 1 via ORAL
  Filled 2021-05-23: qty 1

## 2021-05-23 NOTE — ED Provider Notes (Signed)
[facility] EMERGENCY DEPARTMENT Provider Note   CSN: [id number] Arrival date & time: 05/23/21  3664     History Chief Complaint  Patient presents with   Leg Pain    [place] is a 87 y.o. female who presents to the ED today with complaint of ongoing, worsening, R thigh pain s/p fall that occurred on 08/18.  Per patient and family member she was at the bank on [place] and had a fall.  She fell onto her hip and has been having some pain since then.  She initially saw her PCP for same with a negative x-ray.  She went to [facility] emergency department 2 days ago with same complaint.  She had nondiagnostic x-ray at that time however she did have CT scan which showed a healing and nondisplaced fracture in the right pubic rami.  She subsequently had surgery on her right hand on the 15th and was prescribed Norco for same which she had been taking.  She was discharged home with instructions to continue the [place].  She was also prescribed lidocaine patches and advised to follow-up with orthopedics for same.  Family reports that for the past 4 days she has been having worsening pain, it is mostly in the right thigh area.  She has no pain with laying or sitting however when she tries to ambulate she feels a pain in her thigh.  She states she does not typically ambulate with a cane however for the past 4 days has had to do so due to pain.  Her Norco is not touching the pain whatsoever.  She did not have the lidocaine patches filled due to cost however has been applying Salonpas patches over-the-counter without relief.  She denies any swelling or redness to the thigh.  She does report she had pain prior to her hand surgery 5 days ago.  She denies any chest pain or shortness of breath. No other complaints at this time.   The history is provided by the patient, a relative and medical records.      Past Medical History:  Diagnosis Date   Arrhythmia    Diverticulosis of colon  (without mention of hemorrhage)    Hypothyroidism    IBS (irritable bowel syndrome)    Neuropathy    Personal history of colonic polyps 1999 & 2007   hyperplastic     Patient Active Problem List   Diagnosis Date Noted   Chest pain with high risk for cardiac etiology 11/11/2014   Chest pain 11/11/2014   CKD (chronic kidney disease) stage 3, GFR 30-59 ml/min ([place]) 11/11/2014   Essential hypertension 11/11/2014   Hyperlipidemia 11/11/2014    Past Surgical History:  Procedure Laterality Date   ABDOMINAL HYSTERECTOMY     FOOT ARTHRODESIS     pipj digits  2-4 left foot   TENDON REPAIR Right 05/18/2021   Procedure: RIGHT RING AND RIGHT SMALL TENDON TRANSFER VS EXTENSOR INDICIS PROPRIUS TO RING AND SMALL FINGER TRANSFER; DISTAL RADIOULNAR JOINT DEBRIDEMENT ULNA  DISTAL RESECTION WITH STABILIZATION;  Surgeon: [name], MD;  Location: [place];  Service: Orthopedics;  Laterality: Right;   TONSILLECTOMY  06/26/2012   Procedure: TONSILLECTOMY;  Surgeon: Ascencion Dike, MD;  Location: [facility];  Service: ENT;  Laterality: Right;   VAGINAL HYSTERECTOMY       OB History     Gravida  1   Para  1   Term  1   Preterm  0  AB  0   Living         SAB  0   IAB  0   Ectopic  0   Multiple      Live Births              Family History  Problem Relation Age of Onset   CAD Neg Hx    Breast cancer Neg Hx     Social History   Tobacco Use   Smoking status: Never   Smokeless tobacco: Never  Vaping Use   Vaping Use: Never used  Substance Use Topics   Alcohol use: No   Drug use: No    Home Medications Prior to Admission medications   Medication Sig Start Date End Date Taking? Authorizing Provider  oxyCODONE-acetaminophen (PERCOCET/ROXICET) 5-325 MG tablet Take 1 tablet by mouth every 6 (six) hours as needed for severe pain. 05/23/21  Yes Shahd Occhipinti, PA-C  chlorthalidone (HYGROTON) 25 MG tablet Take 25 mg by mouth every other day. 11/02/20 11/02/21   [provider]  Cholecalciferol 25 MCG (1000 UT) capsule Take 1,000 Units by mouth daily. 08/31/20 08/31/21  [provider]  Cyanocobalamin (VITAMIN B-12 CR PO) Take by mouth.    [provider]  HYDROcodone-acetaminophen (NORCO) 5-325 MG tablet 1 tab po q6 hours prn pain 05/18/21   [name], MD  levothyroxine (SYNTHROID, LEVOTHROID) 75 MCG tablet Take 75 mcg by mouth daily.      [provider]  lidocaine (LIDODERM) 5 % Place 1 patch onto the skin daily. Remove & Discard patch within 12 hours or as directed by MD 05/21/21   Dorie Rank, MD  metoprolol succinate (TOPROL XL) 25 MG 24 hr tablet Take 1 tablet (25 mg total) by mouth daily. 04/20/20   Fay Records, MD  Multiple Minerals-Vitamins (CALCIUM & VIT D3 BONE HEALTH PO) Take 1 tablet by mouth daily.    [provider]  simvastatin (ZOCOR) 20 MG tablet Take 20 mg by mouth every morning.     [provider]    Allergies    Patient has no known allergies.  Review of Systems   Review of Systems  Constitutional:  Negative for chills and fever.  Cardiovascular:  Negative for leg swelling.  Musculoskeletal:  Positive for arthralgias.  Skin:  Negative for color change and wound.  All other systems reviewed and are negative.  Physical Exam Updated Vital Signs BP (!) 165/70   Pulse 66   Temp 98 F (36.7 C) (Oral)   Resp 18   Ht 6' (1.829 m)   Wt 58.1 kg   SpO2 100%   BMI 17.36 kg/m   Physical Exam Vitals and nursing note reviewed.  Constitutional:      Appearance: She is not ill-appearing or diaphoretic.  HENT:     Head: Normocephalic and atraumatic.  Eyes:     Conjunctiva/sclera: Conjunctivae normal.  Cardiovascular:     Rate and Rhythm: Normal rate and regular rhythm.  Pulmonary:     Effort: Pulmonary effort is normal.     Breath sounds: Normal breath sounds.  Abdominal:     Palpations: Abdomen is soft.     Tenderness: There is no abdominal tenderness.   Musculoskeletal:     Cervical back: Neck supple.     Comments: Ambulating with slight limp on right side. Mild TTP to the anterior right mid thigh. No overlying skin changes. No TTP to the right hip. ROM intact to RLE. Strength and sensation  intact. 2+ DP pulse.   Skin:    General: Skin is warm and dry.  Neurological:     Mental Status: She is alert.    ED Results / Procedures / Treatments   Labs (all labs ordered are listed, but only abnormal results are displayed) Labs Reviewed - No data to display  EKG None  Radiology CT FEMUR RIGHT WO CONTRAST  Result Date: 05/23/2021 CLINICAL DATA:  History of prior fall with known inferior pubic ramus fracture with healing, persistent mid thigh pain EXAM: CT OF THE LOWER RIGHT EXTREMITY WITHOUT CONTRAST TECHNIQUE: Multidetector CT imaging of the right lower extremity was performed according to the standard protocol. COMPARISON:  Plain film from earlier in the same day as well as multiple CTs and plain films from 05/21/2021. FINDINGS: Bones/Joint/Cartilage There is again noted a band of sclerosis in the inferior pubic ramus on the right consistent with a prior undisplaced fracture with mild healing. The femur is well visualized and shows no evidence of acute fracture or dislocation. Ligaments Suboptimally assessed by CT. Muscles and Tendons No definitive muscular hematoma is noted. Soft tissues No knee or hip joint effusion is seen. Visualized pelvic bones appear within normal limits. Diffuse vascular calcifications are seen. IMPRESSION: No acute femoral fracture is noted. Stable changes are noted in the right inferior pubic ramus consistent with undisplaced fracture and mild callus formation. No gross soft tissue abnormality is noted. Electronically Signed   By: Inez Catalina M.D.   On: 05/23/2021 19:30   DG Femur Min 2 Views Right  Result Date: 05/23/2021 CLINICAL DATA:  Right thigh pain after fall several weeks ago. EXAM: RIGHT FEMUR 2 VIEWS  COMPARISON:  May 21, 2021. FINDINGS: There is no evidence of fracture or other focal bone lesions. Soft tissues are unremarkable. IMPRESSION: Negative. Electronically Signed   By: Marijo Conception M.D.   On: 05/23/2021 09:39   VAS [place] LOWER EXTREMITY VENOUS (DVT) (ONLY [facility] & [facility] 7a-7p)  Result Date: 05/23/2021  Lower Venous DVT Study Patient Name:  Brittany Holden  Date of Exam:   05/23/2021 Medical Rec #: [id number]            Accession #:    [id number] Date of Birth: 11-01-1929           Patient Gender: F Patient Age:   44 years Exam Location:  [facility] Procedure:      VAS [place] LOWER EXTREMITY VENOUS (DVT) Referring Phys: Jenette Rayson --------------------------------------------------------------------------------  Indications: Pain.  Risk Factors: None identified. Comparison Study: No prior stuides. Performing Technologist: Oliver Hum RVT  Examination Guidelines: A complete evaluation includes B-mode imaging, spectral Doppler, color Doppler, and power Doppler as needed of all accessible portions of each vessel. Bilateral testing is considered an integral part of a complete examination. Limited examinations for reoccurring indications may be performed as noted. The reflux portion of the exam is performed with the patient in reverse Trendelenburg.  +---------+---------------+---------+-----------+----------+--------------+ RIGHT    CompressibilityPhasicitySpontaneityPropertiesThrombus Aging +---------+---------------+---------+-----------+----------+--------------+ CFV      Full           Yes      Yes                                 +---------+---------------+---------+-----------+----------+--------------+ SFJ      Full                                                        +---------+---------------+---------+-----------+----------+--------------+  FV Prox  Full                                                         +---------+---------------+---------+-----------+----------+--------------+ FV Mid   Full                                                        +---------+---------------+---------+-----------+----------+--------------+ FV DistalFull                                                        +---------+---------------+---------+-----------+----------+--------------+ PFV      Full                                                        +---------+---------------+---------+-----------+----------+--------------+ POP      Full           Yes      Yes                                 +---------+---------------+---------+-----------+----------+--------------+ PTV      Full                                                        +---------+---------------+---------+-----------+----------+--------------+ PERO     Full                                                        +---------+---------------+---------+-----------+----------+--------------+   +----+---------------+---------+-----------+----------+--------------+ LEFTCompressibilityPhasicitySpontaneityPropertiesThrombus Aging +----+---------------+---------+-----------+----------+--------------+ CFV Full           Yes      Yes                                 +----+---------------+---------+-----------+----------+--------------+     Summary: RIGHT: - There is no evidence of deep vein thrombosis in the lower extremity.  - No cystic structure found in the popliteal fossa.  LEFT: - No evidence of common femoral vein obstruction.  *See table(s) above for measurements and observations. Electronically signed by Deitra Mayo MD on 05/23/2021 at 6:54:06 PM.    Final     Procedures Procedures   Medications Ordered in ED Medications  oxyCODONE-acetaminophen (PERCOCET/ROXICET) 5-325 MG per tablet 1 tablet (has no administration in time range)    ED Course  I have reviewed the triage vital signs and the nursing  notes.  Pertinent labs & imaging results that were available  during my care of the patient were reviewed by me and considered in my medical decision making (see chart for details).    MDM Rules/Calculators/A&P                           87 year old female who presents to the ED today with complaint of ongoing right mid thigh pain status post fall that occurred 1 month ago.  Was found to have incidental nondisplaced healing right pubic rami fracture at ED visit 2 days ago.  She states her pain is not in her hip and is mostly in her thigh area prompting return ED visit today.  She did have an x-ray done while in the waiting room which did not show any acute findings of her right femur.  On my exam she has mild tenderness palpation to the right mid thigh.  There are no overlying skin changes.  No swelling.  She is able to ambulate however does have a slight limp with ambulation.  She has been having to use a cane for the past 4 days which is atypical for her despite her age of 28.  Given this and unequivocal x-ray will plan for CT femur for further evaluation. Will also plan for DVT study.   DVT study negative CT: IMPRESSION:  No acute femoral fracture is noted.     Stable changes are noted in the right inferior pubic ramus  consistent with undisplaced fracture and mild callus formation.     No gross soft tissue abnormality is noted.   Workup reassuring at this time. Will discharge home with short course of percocet to see if this helps. Pt instructed to discontinue the norco she has at home. She is recommended to follow up with orthopedist if pain continues. She is in agreement with plan and stable for discharge home.   This note was prepared using Dragon voice recognition software and may include unintentional dictation errors due to the inherent limitations of voice recognition software.   Final Clinical Impression(s) / ED Diagnoses Final diagnoses:  Right leg pain    Rx / DC Orders ED  Discharge Orders          Ordered    oxyCODONE-acetaminophen (PERCOCET/ROXICET) 5-325 MG tablet  Every 6 hours PRN        05/23/21 1940             Discharge Instructions      Your workup was reassuring today. Please pick up the pain medication and take as needed. Discontinue the use of your Hydrocodone-Acetaminophen (also known as Vicoden or Norco) and take this other pain medication instead.   If pain persists please follow up with Dr. Ninfa Linden orthopedist for further eval  Return to the ED for any new/worsening symptoms       Eustaquio Maize, Hershal Coria 05/23/21 1941    Noemi Chapel, MD 05/30/21 1301

## 2021-05-23 NOTE — Progress Notes (Signed)
Right lower extremity venous duplex has been completed. Preliminary results can be found in CV Proc through chart review.  Results were given to Eustaquio Maize PA.  05/23/21 6:15 PM Carlos Levering RVT

## 2021-05-23 NOTE — Discharge Instructions (Addendum)
Your workup was reassuring today. Please pick up the pain medication and take as needed. Discontinue the use of your Hydrocodone-Acetaminophen (also known as Vicoden or Norco) and take this other pain medication instead.   If pain persists please follow up with Dr. Ninfa Linden orthopedist for further eval  Return to the ED for any new/worsening symptoms

## 2021-05-23 NOTE — ED Provider Notes (Signed)
Emergency Medicine Provider Triage Evaluation Note  Brittany Holden [facility] , Holden 87 y.o. female  was evaluated in triage.  Pt complains of right femur pain.  Had fall 2 weeks ago.  Was seen at [facility] had CT scan of pelvis which showed healing pubic rami fracture.  Patient states she can barely move.  States her hip and her pelvis are not hurting however she is having midshaft, distal femur pain whenever she moves.  No pain to knee, tib-fib.  She is changing her home oxycodone however is unable to stay by herself due to the increase in pain.  No paresthesias or weakness.  No new falls.  Recent surgery 5 days ago however denies any redness, swelling, warmth to extremity to suggest VTE  Review of Systems  Positive: Right mid femur pain Negative: Fall, numbness  Physical Exam  BP (!) 143/79 (BP Location: Left Arm)   Pulse 71   Temp 98.6 F (37 C) (Oral)   Resp 14   SpO2 98%  Gen:   Awake, no distress   Resp:  Normal effort  MSK:   Moves extremities without difficulty, diffuse tenderness to midshaft, distal femur.  Right upper extremity in splint Other:    Medical Decision Making  Medically screening exam initiated at 8:51 AM.  Appropriate orders placed.  Brittany Holden [facility] was informed that the remainder of the evaluation will be completed by another provider, this initial triage assessment does not replace that evaluation, and the importance of remaining in the ED until their evaluation is complete.  Right midshaft femur pain   Brittany Patane A, PA-C 05/23/21 0854    Truddie Hidden, MD 05/23/21 [phone]

## 2021-05-23 NOTE — ED Notes (Signed)
Pt transported to CT ?

## 2021-05-23 NOTE — ED Triage Notes (Signed)
Pt arrived complaining of left leg pain , has a fall 2wks ago and was seen at [facility] was told she has a stress fracture in her pelvis, states pain has gotten much worse.  She has not been walking on it as much as possible, pain gets worse with movement

## 2021-05-26 DIAGNOSIS — M19031 Primary osteoarthritis, right wrist: Secondary | ICD-10-CM | POA: Diagnosis not present

## 2021-05-26 DIAGNOSIS — S66811A Strain of other specified muscles, fascia and tendons at wrist and hand level, right hand, initial encounter: Secondary | ICD-10-CM | POA: Diagnosis not present

## 2021-05-31 ENCOUNTER — Encounter (HOSPITAL_COMMUNITY): Payer: Self-pay | Admitting: Emergency Medicine

## 2021-05-31 ENCOUNTER — Other Ambulatory Visit: Payer: Self-pay

## 2021-05-31 ENCOUNTER — Emergency Department (HOSPITAL_COMMUNITY)
Admission: EM | Admit: 2021-05-31 | Discharge: 2021-05-31 | Disposition: A | Discharge status: Home / Self Care | Payer: Medicare HMO | Attending: Emergency Medicine | Admitting: Emergency Medicine

## 2021-05-31 DIAGNOSIS — W19XXXA Unspecified fall, initial encounter: Secondary | ICD-10-CM | POA: Insufficient documentation

## 2021-05-31 DIAGNOSIS — Z5321 Procedure and treatment not carried out due to patient leaving prior to being seen by health care provider: Secondary | ICD-10-CM | POA: Insufficient documentation

## 2021-05-31 DIAGNOSIS — M79604 Pain in right leg: Secondary | ICD-10-CM | POA: Insufficient documentation

## 2021-05-31 NOTE — ED Provider Notes (Signed)
Emergency Medicine Provider Triage Evaluation Note  Brittany Holden [facility] , a 87 y.o. female  was evaluated in triage.  Pt complains of continued R leg pain s/p fall that occurred in August. Pt has been seen in the ED twice for same without obvious findings. She was seen last week with a negative CT femur and negative ultrasound. She has not followed up with orthopedics regarding ED visit. Her son brings her back today for continued pain.   Review of Systems  Positive: + Right leg pain Negative: - weakness, numbness  Physical Exam  BP 116/74 (BP Location: Left Arm)   Pulse 85   Temp 97.9 F (36.6 C) (Oral)   Resp 16   SpO2 98%  Gen:   Awake, no distress   Resp:  Normal effort  MSK:   Moves extremities without difficulty  Other:  Moving RLE without difficulty. Mild TTP to R anterior thigh.   Medical Decision Making  Medically screening exam initiated at 1:50 PM.  Appropriate orders placed.  Suzanna Obey [facility] was informed that the remainder of the evaluation will be completed by another provider, this initial triage assessment does not replace that evaluation, and the importance of remaining in the ED until their evaluation is complete.     Eustaquio Maize, PA-C 05/31/21 1351    Tegeler, Gwenyth Allegra, MD 05/31/21 [phone]

## 2021-05-31 NOTE — ED Triage Notes (Signed)
Pt here for R leg pain x2 weeks, worse w/ ambulation and movement. PT had a fall about 3 wks ago, was seen in ER for it.

## 2021-06-01 ENCOUNTER — Other Ambulatory Visit (HOSPITAL_BASED_OUTPATIENT_CLINIC_OR_DEPARTMENT_OTHER): Payer: Self-pay

## 2021-06-01 ENCOUNTER — Ambulatory Visit (INDEPENDENT_AMBULATORY_CARE_PROVIDER_SITE_OTHER): Payer: Medicare HMO | Admitting: Orthopaedic Surgery

## 2021-06-01 VITALS — BP 166/77 | Ht 72.0 in | Wt 128.0 lb

## 2021-06-01 DIAGNOSIS — S7011XA Contusion of right thigh, initial encounter: Secondary | ICD-10-CM | POA: Diagnosis not present

## 2021-06-01 MED ORDER — TRAMADOL HCL 50 MG PO TABS
50.0000 mg | ORAL_TABLET | Freq: Two times a day (BID) | ORAL | 3 refills | Status: AC
  Filled 2021-06-01: qty 30, 15d supply, fill #0

## 2021-06-01 NOTE — Progress Notes (Signed)
Chief Complaint: Right leg pain     History of Present Illness:   Pain Score: 7/10  Brittany Holden is a 87 y.o. female with right leg and thigh pain after a fall on May 21, 2021.  She was leaving the bank when she landed directly on the right side.  She went to the emergency room where a CT scan was obtained and read as having a fracture of the pubic ramus.  Since that time she has had difficulty weightbearing on the right side and is using a cane in the right hand.  She describes the pain as severe and in the mid thigh.  She was given narcotic medication which she did say helped somewhat.  She is also been taking ibuprofen which is not helpful    Surgical History:   None  PMH/PSH/Family History/Social History/Meds/Allergies:    Past Medical History:  Diagnosis Date   Arrhythmia    Diverticulosis of colon (without mention of hemorrhage)    Hypothyroidism    IBS (irritable bowel syndrome)    Neuropathy    Personal history of colonic polyps 1999 & 2007   hyperplastic    Past Surgical History:  Procedure Laterality Date   ABDOMINAL HYSTERECTOMY     FOOT ARTHRODESIS     pipj digits  2-4 left foot   TENDON REPAIR Right 05/18/2021   Procedure: RIGHT RING AND RIGHT SMALL TENDON TRANSFER VS EXTENSOR INDICIS PROPRIUS TO RING AND SMALL FINGER TRANSFER; DISTAL RADIOULNAR JOINT DEBRIDEMENT ULNA  DISTAL RESECTION WITH STABILIZATION;  Surgeon: [name], MD;  Location: [place];  Service: Orthopedics;  Laterality: Right;   TONSILLECTOMY  06/26/2012   Procedure: TONSILLECTOMY;  Surgeon: Ascencion Dike, MD;  Location: [facility];  Service: ENT;  Laterality: Right;   VAGINAL HYSTERECTOMY     Social History   Socioeconomic History   Marital status: Unknown    Spouse name: Not on file   Number of children: Not on file   Years of education: Not on file   Highest education level: Not on file  Occupational History   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  Tobacco Use   Smoking status: Never   Smokeless tobacco: Never  Vaping Use   Vaping Use: Never used  Substance and Sexual Activity   Alcohol use: No   Drug use: No   Sexual activity: Yes    Birth control/protection: Surgical  Other Topics Concern   Not on file  Social History Narrative   ** Merged History Encounter **       Social Determinants of Health   Financial Resource Strain: Not on file  Food Insecurity: Not on file  Transportation Needs: Not on file  Physical Activity: Not on file  Stress: Not on file  Social Connections: Not on file   Family History  Problem Relation Age of Onset   CAD Neg Hx    Breast cancer Neg Hx    No Known Allergies Current Outpatient Medications  Medication Sig Dispense Refill   traMADol (ULTRAM) 50 MG tablet Take 1 tablet (50 mg total) by mouth 2 (two) times daily. 30 tablet 3   chlorthalidone (HYGROTON) 25 MG tablet Take 25 mg by mouth every other day.     Cholecalciferol 25 MCG (1000 UT) capsule Take 1,000  Units by mouth daily.     Cyanocobalamin (VITAMIN B-12 CR PO) Take by mouth.     HYDROcodone-acetaminophen (NORCO) 5-325 MG tablet 1 tab po q6 hours prn pain 20 tablet 0   levothyroxine (SYNTHROID, LEVOTHROID) 75 MCG tablet Take 75 mcg by mouth daily.       lidocaine (LIDODERM) 5 % Place 1 patch onto the skin daily. Remove & Discard patch within 12 hours or as directed by MD 30 patch 0   metoprolol succinate (TOPROL XL) 25 MG 24 hr tablet Take 1 tablet (25 mg total) by mouth daily. 90 tablet 3   Multiple Minerals-Vitamins (CALCIUM & VIT D3 BONE HEALTH PO) Take 1 tablet by mouth daily.     oxyCODONE-acetaminophen (PERCOCET/ROXICET) 5-325 MG tablet Take 1 tablet by mouth every 6 (six) hours as needed for severe pain. 10 tablet 0   simvastatin (ZOCOR) 20 MG tablet Take 20 mg by mouth every morning.      No current facility-administered medications for this visit.   No results found.  Review of Systems:    A ROS was performed including pertinent positives and negatives as documented in the HPI.  Physical Exam :   Constitutional: NAD and appears stated age Neurological: Alert and oriented Psych: Appropriate affect and cooperative Blood pressure (!) 166/77, height 6' (1.829 m), weight 128 lb (58.1 kg).   Comprehensive Musculoskeletal Exam:    Tender to palpation about the right mid thigh.  She has range of motion about the right knee from 0 to 120 degrees.  The pain is predominantly when standing.  She has no pain with direct palpation of the femur.  No pain with direct palpation of the pelvis.  No pain with internal rotation of the hip to 30 degrees or external rotation of the hip to 40 degrees  Imaging:   CT scan of right hip and pelvis  I do not appreciate any specific pubic ramus fracture.  She does have very significant osteoarthritis of the right hip  I personally reviewed and interpreted the radiographs.   Assessment:   87 year old female with right mid thigh pain after a fall while landing directly on the right side.  I described that it is likely that her pain is more of a deep bruise or contusion type pain.  I do not see any specific fractures on the right femur right hip or right pelvis.  She may continue to be weightbearing as tolerated.  I have advised that ultimately this may take up to an additional month to begin to feel better.  She may continue to use her cane for assistance in ambulation.  I will plan to prescribe her with tramadol to assist with pain.  I will also prescribe physical therapy for the right leg for hip and leg strengthening.  Plan :    -Tramadol 50 mg twice daily prescribed -Physical therapy for right hip and leg     I personally saw and evaluated the patient, and participated in the management and treatment plan.  Vanetta Mulders, MD Attending Physician, Orthopedic Surgery  This document was dictated using Dragon voice recognition software. A  reasonable attempt at proof reading has been made to minimize errors.

## 2021-06-02 ENCOUNTER — Telehealth: Payer: Self-pay | Admitting: Orthopaedic Surgery

## 2021-06-02 ENCOUNTER — Other Ambulatory Visit (HOSPITAL_BASED_OUTPATIENT_CLINIC_OR_DEPARTMENT_OTHER): Payer: Self-pay

## 2021-06-02 NOTE — Telephone Encounter (Signed)
Patient's son called. He would like to know if his mom should be taking the Tramadol 1x or 2x a day. His call back number is [phone]

## 2021-06-07 DIAGNOSIS — S66811A Strain of other specified muscles, fascia and tendons at wrist and hand level, right hand, initial encounter: Secondary | ICD-10-CM | POA: Diagnosis not present

## 2021-06-07 DIAGNOSIS — M79644 Pain in right finger(s): Secondary | ICD-10-CM | POA: Diagnosis not present

## 2021-06-07 DIAGNOSIS — M25631 Stiffness of right wrist, not elsewhere classified: Secondary | ICD-10-CM | POA: Diagnosis not present

## 2021-06-07 DIAGNOSIS — M25531 Pain in right wrist: Secondary | ICD-10-CM | POA: Diagnosis not present

## 2021-06-07 DIAGNOSIS — M19031 Primary osteoarthritis, right wrist: Secondary | ICD-10-CM | POA: Diagnosis not present

## 2021-06-07 DIAGNOSIS — M25641 Stiffness of right hand, not elsewhere classified: Secondary | ICD-10-CM | POA: Diagnosis not present

## 2021-06-09 ENCOUNTER — Encounter (HOSPITAL_COMMUNITY): Payer: Self-pay

## 2021-06-09 ENCOUNTER — Ambulatory Visit (HOSPITAL_COMMUNITY): Payer: Medicare HMO

## 2021-06-15 DIAGNOSIS — M79644 Pain in right finger(s): Secondary | ICD-10-CM | POA: Diagnosis not present

## 2021-06-15 DIAGNOSIS — M25641 Stiffness of right hand, not elsewhere classified: Secondary | ICD-10-CM | POA: Diagnosis not present

## 2021-06-15 DIAGNOSIS — M25631 Stiffness of right wrist, not elsewhere classified: Secondary | ICD-10-CM | POA: Diagnosis not present

## 2021-06-15 DIAGNOSIS — M25531 Pain in right wrist: Secondary | ICD-10-CM | POA: Diagnosis not present

## 2021-06-15 DIAGNOSIS — S66811A Strain of other specified muscles, fascia and tendons at wrist and hand level, right hand, initial encounter: Secondary | ICD-10-CM | POA: Diagnosis not present

## 2021-06-15 DIAGNOSIS — M19031 Primary osteoarthritis, right wrist: Secondary | ICD-10-CM | POA: Diagnosis not present

## 2021-06-20 ENCOUNTER — Encounter (HOSPITAL_COMMUNITY): Payer: Self-pay | Admitting: Radiology

## 2021-06-21 DIAGNOSIS — M79644 Pain in right finger(s): Secondary | ICD-10-CM | POA: Diagnosis not present

## 2021-06-21 DIAGNOSIS — S66811A Strain of other specified muscles, fascia and tendons at wrist and hand level, right hand, initial encounter: Secondary | ICD-10-CM | POA: Diagnosis not present

## 2021-06-21 DIAGNOSIS — M25641 Stiffness of right hand, not elsewhere classified: Secondary | ICD-10-CM | POA: Diagnosis not present

## 2021-06-21 DIAGNOSIS — M25531 Pain in right wrist: Secondary | ICD-10-CM | POA: Diagnosis not present

## 2021-06-21 DIAGNOSIS — M19031 Primary osteoarthritis, right wrist: Secondary | ICD-10-CM | POA: Diagnosis not present

## 2021-06-21 DIAGNOSIS — M25631 Stiffness of right wrist, not elsewhere classified: Secondary | ICD-10-CM | POA: Diagnosis not present

## 2021-06-26 ENCOUNTER — Other Ambulatory Visit (HOSPITAL_COMMUNITY): Payer: Self-pay | Admitting: Family Medicine

## 2021-06-26 ENCOUNTER — Other Ambulatory Visit: Payer: Self-pay | Admitting: Family Medicine

## 2021-06-26 ENCOUNTER — Ambulatory Visit (HOSPITAL_COMMUNITY): Payer: Medicare HMO | Admitting: Physical Therapy

## 2021-06-26 DIAGNOSIS — G96191 Perineural cyst: Secondary | ICD-10-CM | POA: Diagnosis not present

## 2021-06-28 DIAGNOSIS — M25641 Stiffness of right hand, not elsewhere classified: Secondary | ICD-10-CM | POA: Diagnosis not present

## 2021-06-28 DIAGNOSIS — M25531 Pain in right wrist: Secondary | ICD-10-CM | POA: Diagnosis not present

## 2021-06-28 DIAGNOSIS — S66811A Strain of other specified muscles, fascia and tendons at wrist and hand level, right hand, initial encounter: Secondary | ICD-10-CM | POA: Diagnosis not present

## 2021-06-28 DIAGNOSIS — M19031 Primary osteoarthritis, right wrist: Secondary | ICD-10-CM | POA: Diagnosis not present

## 2021-06-28 DIAGNOSIS — M79644 Pain in right finger(s): Secondary | ICD-10-CM | POA: Diagnosis not present

## 2021-06-28 DIAGNOSIS — M25631 Stiffness of right wrist, not elsewhere classified: Secondary | ICD-10-CM | POA: Diagnosis not present

## 2021-07-05 DIAGNOSIS — S66314D Strain of extensor muscle, fascia and tendon of right ring finger at wrist and hand level, subsequent encounter: Secondary | ICD-10-CM | POA: Diagnosis not present

## 2021-07-05 DIAGNOSIS — M79644 Pain in right finger(s): Secondary | ICD-10-CM | POA: Diagnosis not present

## 2021-07-05 DIAGNOSIS — M25631 Stiffness of right wrist, not elsewhere classified: Secondary | ICD-10-CM | POA: Diagnosis not present

## 2021-07-05 DIAGNOSIS — M25641 Stiffness of right hand, not elsewhere classified: Secondary | ICD-10-CM | POA: Diagnosis not present

## 2021-07-05 DIAGNOSIS — M19031 Primary osteoarthritis, right wrist: Secondary | ICD-10-CM | POA: Diagnosis not present

## 2021-07-05 DIAGNOSIS — M25531 Pain in right wrist: Secondary | ICD-10-CM | POA: Diagnosis not present

## 2021-07-05 DIAGNOSIS — S66811A Strain of other specified muscles, fascia and tendons at wrist and hand level, right hand, initial encounter: Secondary | ICD-10-CM | POA: Diagnosis not present

## 2021-07-11 DIAGNOSIS — E039 Hypothyroidism, unspecified: Secondary | ICD-10-CM | POA: Diagnosis not present

## 2021-07-11 DIAGNOSIS — E782 Mixed hyperlipidemia: Secondary | ICD-10-CM | POA: Diagnosis not present

## 2021-07-11 DIAGNOSIS — E875 Hyperkalemia: Secondary | ICD-10-CM | POA: Diagnosis not present

## 2021-07-12 ENCOUNTER — Other Ambulatory Visit: Payer: Self-pay

## 2021-07-12 ENCOUNTER — Ambulatory Visit (HOSPITAL_COMMUNITY)
Admission: RE | Admit: 2021-07-12 | Discharge: 2021-07-12 | Disposition: A | Discharge status: Home / Self Care | Payer: Medicare HMO | Source: Ambulatory Visit | Attending: Family Medicine | Admitting: Family Medicine

## 2021-07-12 DIAGNOSIS — M160 Bilateral primary osteoarthritis of hip: Secondary | ICD-10-CM | POA: Diagnosis not present

## 2021-07-12 DIAGNOSIS — G96191 Perineural cyst: Secondary | ICD-10-CM | POA: Diagnosis not present

## 2021-07-12 DIAGNOSIS — N83202 Unspecified ovarian cyst, left side: Secondary | ICD-10-CM | POA: Diagnosis not present

## 2021-07-12 DIAGNOSIS — M7061 Trochanteric bursitis, right hip: Secondary | ICD-10-CM | POA: Diagnosis not present

## 2021-07-12 DIAGNOSIS — M533 Sacrococcygeal disorders, not elsewhere classified: Secondary | ICD-10-CM | POA: Diagnosis not present

## 2021-07-12 MED ORDER — GADOBUTROL 1 MMOL/ML IV SOLN
6.0000 mL | Freq: Once | INTRAVENOUS | Status: AC | PRN
  Administered 2021-07-12: 6 mL via INTRAVENOUS

## 2021-07-17 DIAGNOSIS — I10 Essential (primary) hypertension: Secondary | ICD-10-CM | POA: Diagnosis not present

## 2021-07-17 DIAGNOSIS — E039 Hypothyroidism, unspecified: Secondary | ICD-10-CM | POA: Diagnosis not present

## 2021-07-17 DIAGNOSIS — E875 Hyperkalemia: Secondary | ICD-10-CM | POA: Diagnosis not present

## 2021-07-17 DIAGNOSIS — E782 Mixed hyperlipidemia: Secondary | ICD-10-CM | POA: Diagnosis not present

## 2021-07-17 DIAGNOSIS — R7301 Impaired fasting glucose: Secondary | ICD-10-CM | POA: Diagnosis not present

## 2021-07-17 DIAGNOSIS — N1831 Chronic kidney disease, stage 3a: Secondary | ICD-10-CM | POA: Diagnosis not present

## 2021-07-19 DIAGNOSIS — M25641 Stiffness of right hand, not elsewhere classified: Secondary | ICD-10-CM | POA: Diagnosis not present

## 2021-07-25 DIAGNOSIS — N1832 Chronic kidney disease, stage 3b: Secondary | ICD-10-CM | POA: Diagnosis not present

## 2021-08-10 DIAGNOSIS — N1831 Chronic kidney disease, stage 3a: Secondary | ICD-10-CM | POA: Diagnosis not present

## 2021-08-16 ENCOUNTER — Other Ambulatory Visit (HOSPITAL_COMMUNITY): Payer: Self-pay | Admitting: Nephrology

## 2021-08-16 ENCOUNTER — Other Ambulatory Visit: Payer: Self-pay | Admitting: Nephrology

## 2021-08-16 DIAGNOSIS — E875 Hyperkalemia: Secondary | ICD-10-CM | POA: Diagnosis not present

## 2021-08-16 DIAGNOSIS — I129 Hypertensive chronic kidney disease with stage 1 through stage 4 chronic kidney disease, or unspecified chronic kidney disease: Secondary | ICD-10-CM | POA: Diagnosis not present

## 2021-08-16 DIAGNOSIS — N1832 Chronic kidney disease, stage 3b: Secondary | ICD-10-CM | POA: Diagnosis not present

## 2021-08-16 DIAGNOSIS — I5032 Chronic diastolic (congestive) heart failure: Secondary | ICD-10-CM | POA: Diagnosis not present

## 2021-08-16 DIAGNOSIS — N281 Cyst of kidney, acquired: Secondary | ICD-10-CM | POA: Diagnosis not present

## 2021-08-23 ENCOUNTER — Other Ambulatory Visit: Payer: Self-pay

## 2021-08-23 ENCOUNTER — Ambulatory Visit (HOSPITAL_COMMUNITY)
Admission: RE | Admit: 2021-08-23 | Discharge: 2021-08-23 | Disposition: A | Discharge status: Home / Self Care | Payer: Medicare HMO | Source: Ambulatory Visit | Attending: Nephrology | Admitting: Nephrology

## 2021-08-23 DIAGNOSIS — N1832 Chronic kidney disease, stage 3b: Secondary | ICD-10-CM

## 2021-08-23 DIAGNOSIS — N281 Cyst of kidney, acquired: Secondary | ICD-10-CM | POA: Diagnosis not present

## 2021-08-23 DIAGNOSIS — I129 Hypertensive chronic kidney disease with stage 1 through stage 4 chronic kidney disease, or unspecified chronic kidney disease: Secondary | ICD-10-CM | POA: Diagnosis not present

## 2021-08-30 ENCOUNTER — Other Ambulatory Visit: Payer: Self-pay

## 2021-08-30 ENCOUNTER — Emergency Department (HOSPITAL_COMMUNITY)
Admission: EM | Admit: 2021-08-30 | Discharge: 2021-08-30 | Disposition: A | Discharge status: Home / Self Care | Payer: Medicare HMO | Attending: Emergency Medicine | Admitting: Emergency Medicine

## 2021-08-30 ENCOUNTER — Emergency Department (HOSPITAL_COMMUNITY): Payer: Medicare HMO

## 2021-08-30 DIAGNOSIS — E039 Hypothyroidism, unspecified: Secondary | ICD-10-CM | POA: Insufficient documentation

## 2021-08-30 DIAGNOSIS — I129 Hypertensive chronic kidney disease with stage 1 through stage 4 chronic kidney disease, or unspecified chronic kidney disease: Secondary | ICD-10-CM | POA: Diagnosis not present

## 2021-08-30 DIAGNOSIS — N183 Chronic kidney disease, stage 3 unspecified: Secondary | ICD-10-CM | POA: Diagnosis not present

## 2021-08-30 DIAGNOSIS — M47816 Spondylosis without myelopathy or radiculopathy, lumbar region: Secondary | ICD-10-CM | POA: Diagnosis not present

## 2021-08-30 DIAGNOSIS — M25559 Pain in unspecified hip: Secondary | ICD-10-CM

## 2021-08-30 DIAGNOSIS — M779 Enthesopathy, unspecified: Secondary | ICD-10-CM | POA: Insufficient documentation

## 2021-08-30 DIAGNOSIS — M6790 Unspecified disorder of synovium and tendon, unspecified site: Secondary | ICD-10-CM | POA: Diagnosis not present

## 2021-08-30 DIAGNOSIS — Z79899 Other long term (current) drug therapy: Secondary | ICD-10-CM | POA: Diagnosis not present

## 2021-08-30 DIAGNOSIS — M6780 Other specified disorders of synovium and tendon, unspecified site: Secondary | ICD-10-CM

## 2021-08-30 DIAGNOSIS — M25551 Pain in right hip: Secondary | ICD-10-CM | POA: Diagnosis not present

## 2021-08-30 DIAGNOSIS — M160 Bilateral primary osteoarthritis of hip: Secondary | ICD-10-CM | POA: Diagnosis not present

## 2021-08-30 MED ORDER — PREDNISONE 50 MG PO TABS
50.0000 mg | ORAL_TABLET | Freq: Once | ORAL | Status: AC
  Administered 2021-08-30: 11:00:00 50 mg via ORAL
  Filled 2021-08-30: qty 1

## 2021-08-30 MED ORDER — PREDNISONE 10 MG PO TABS: ORAL_TABLET | ORAL | 0 refills | Status: DC

## 2021-08-30 NOTE — ED Provider Notes (Signed)
[facility] EMERGENCY DEPARTMENT Provider Note   CSN: [id number] Arrival date & time: 08/30/21  0801     History Chief Complaint  Patient presents with   Muscle Pain    Brittany Holden is a 87 y.o. female with history including hypothyroidism, IBS, chronic kidney disease, hypertension hyperlipidemia presenting for evaluation of right posterior buttock pain which is triggered by standing and walking.  She denies any acute injuries to the site but does endorse a fall which occurred several weeks ago.  She describes being very active, goes to the [facility] where she rides a bicycle most days of the week, she was unable to go yesterday secondary to this pain.  Son at the bedside presents with an MRI report that was completed November 9 for further evaluation of a sacral lesion seen on a prior CT scan.  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  She is taking Tylenol and has tried sitting on a heating pad for this complaint and has had no significant improvement.  The history is provided by the patient and a relative (son at bedside).      Past Medical History:  Diagnosis Date   Arrhythmia    Diverticulosis of colon (without mention of hemorrhage)    Hypothyroidism    IBS (irritable bowel syndrome)    Neuropathy    Personal history of colonic polyps 1999 & 2007   hyperplastic     Patient Active Problem List   Diagnosis Date Noted   Chest pain with high risk for cardiac etiology 11/11/2014   Chest pain 11/11/2014   CKD (chronic kidney disease) stage 3, GFR 30-59 ml/min ([place]) 11/11/2014   Essential hypertension 11/11/2014   Hyperlipidemia 11/11/2014    Past Surgical History:  Procedure Laterality Date   ABDOMINAL HYSTERECTOMY     FOOT ARTHRODESIS     pipj digits  2-4 left foot   TENDON REPAIR Right 05/18/2021    Procedure: RIGHT RING AND RIGHT SMALL TENDON TRANSFER VS EXTENSOR INDICIS PROPRIUS TO RING AND SMALL FINGER TRANSFER; DISTAL RADIOULNAR JOINT DEBRIDEMENT ULNA  DISTAL RESECTION WITH STABILIZATION;  Surgeon: [name], MD;  Location: [facility];  Service: Orthopedics;  Laterality: Right;   TONSILLECTOMY  06/26/2012   Procedure: TONSILLECTOMY;  Surgeon: Ascencion Dike, MD;  Location: [facility];  Service: ENT;  Laterality: Right;   VAGINAL HYSTERECTOMY       OB History     Gravida  1   Para  1   Term  1   Preterm  0   AB  0   Living         SAB  0   IAB  0   Ectopic  0   Multiple      Live Births              Family History  Problem Relation Age of Onset   CAD Neg Hx    Breast cancer Neg Hx     Social History   Tobacco Use   Smoking status: Never   Smokeless tobacco: Never  Vaping Use   Vaping Use: Never used  Substance Use Topics   Alcohol use: No   Drug use: No    Home Medications Prior to Admission medications   Medication Sig Start Date End Date Taking? Authorizing Provider  predniSONE (DELTASONE) 10 MG  tablet 5, 4, 3, 2 then 1 tablet by mouth daily for 5 days total. 08/30/21  Yes Kaidynce Pfister, Almyra Free, PA-C  chlorthalidone (HYGROTON) 25 MG tablet Take 25 mg by mouth every other day. 11/02/20 11/02/21  [provider]  Cholecalciferol 25 MCG (1000 UT) capsule Take 1,000 Units by mouth daily. 08/31/20 08/31/21  [provider]  Cyanocobalamin (VITAMIN B-12 CR PO) Take by mouth.    [provider]  HYDROcodone-acetaminophen (NORCO) 5-325 MG tablet 1 tab po q6 hours prn pain 05/18/21   [name], MD  levothyroxine (SYNTHROID, LEVOTHROID) 75 MCG tablet Take 75 mcg by mouth daily.      [provider]  lidocaine (LIDODERM) 5 % Place 1 patch onto the skin daily. Remove & Discard patch within 12 hours or as directed by MD 05/21/21   Dorie Rank, MD  metoprolol succinate (TOPROL XL) 25 MG 24 hr tablet Take 1 tablet (25 mg  total) by mouth daily. 04/20/20   Fay Records, MD  Multiple Minerals-Vitamins (CALCIUM & VIT D3 BONE HEALTH PO) Take 1 tablet by mouth daily.    [provider]  oxyCODONE-acetaminophen (PERCOCET/ROXICET) 5-325 MG tablet Take 1 tablet by mouth every 6 (six) hours as needed for severe pain. 05/23/21   Alroy Bailiff, Margaux, PA-C  simvastatin (ZOCOR) 20 MG tablet Take 20 mg by mouth every morning.     [provider]    Allergies    Patient has no known allergies.  Review of Systems   Review of Systems  Constitutional: Negative.  Negative for fever.  HENT: Negative.    Respiratory: Negative.    Cardiovascular: Negative.   Gastrointestinal: Negative.   Musculoskeletal:  Positive for arthralgias. Negative for joint swelling and myalgias.  Skin: Negative.   Neurological:  Negative for weakness and numbness.  All other systems reviewed and are negative.  Physical Exam Updated Vital Signs BP 138/81 (BP Location: Left Arm)    Pulse 77    Temp 98.2 F (36.8 C) (Oral)    Resp 17    Ht 6' (1.829 m)    Wt 58.1 kg    SpO2 96%    BMI 17.36 kg/m   Physical Exam Constitutional:      Appearance: She is well-developed.  HENT:     Head: Atraumatic.  Cardiovascular:     Comments: Pulses equal bilaterally Musculoskeletal:        General: No tenderness.     Cervical back: Normal range of motion.     Right hip: No deformity, bony tenderness or crepitus. Normal range of motion.       Legs:     Comments: Patient has pain which localizes to the right inferior buttock/upper posterior thigh region.  It is not reproducible with range of motion or with deep palpation.  It is reproducible with standing and walking.  Skin:    General: Skin is warm and dry.  Neurological:     Mental Status: She is alert.     Sensory: No sensory deficit.     Motor: No weakness.     Deep Tendon Reflexes: Reflexes normal.    ED Results / Procedures / Treatments   Labs (all labs ordered are listed, but  only abnormal results are displayed) Labs Reviewed - No data to display  EKG None  Radiology DG HIPS BILAT WITH PELVIS MIN 5 VIEWS  Result Date: 08/30/2021 CLINICAL DATA:  buttock pain for 4 days, fall in September EXAM: DG HIP (WITH OR WITHOUT PELVIS)  5+V BILAT COMPARISON:  05/21/2021 pelvic and right hip radiographs FINDINGS: No pelvic fracture or diastasis. No hip fracture or dislocation on either side. Diffuse osteopenia. Minimal bilateral hip osteoarthritis. No suspicious focal osseous lesions. Degenerative changes in the visualized lower lumbar spine. IMPRESSION: No fracture or hip malalignment. Minimal bilateral hip osteoarthritis. Diffuse osteopenia. Electronically Signed   By: Ilona Sorrel M.D.   On: 08/30/2021 10:09    Procedures Procedures   Medications Ordered in ED Medications  predniSONE (DELTASONE) tablet 50 mg (has no administration in time range)    ED Course  I have reviewed the triage vital signs and the nursing notes.  Pertinent labs & imaging results that were available during my care of the patient were reviewed by me and considered in my medical decision making (see chart for details).    MDM Rules/Calculators/A&P                         Imaging reviewed, negative for acute injury.  Suspect sx secondary to her known gluteus minimus tendinosis. Will try prednisone taper, first dose given here.  Encouraged continued heat tx, activities as tolerated.  She has seen Dr. Aline Brochure in the past, referral to him for further evaluation and tx, she may benefit from PT additionally.        Final Clinical Impression(s) / ED Diagnoses Final diagnoses:  Hip pain  Tendinosis    Rx / DC Orders ED Discharge Orders          Ordered    predniSONE (DELTASONE) 10 MG tablet        08/30/21 1040             Evalee Jefferson, PA-C 08/30/21 2015    Truddie Hidden, MD 08/31/21 [phone]

## 2021-08-30 NOTE — ED Triage Notes (Signed)
Pt c/o right "butt cheek" pain x 4 days, worse with ambulation. Denies pain on the hip, denies numbness/tingling, able to stand up and walk with cane. Tylenol helped a little per pt.

## 2021-08-30 NOTE — Discharge Instructions (Signed)
Your x-rays are negative for any acute injuries.  I suspect your symptoms are due to this tendon inflammation that was found on your recent MRI.  I am recommending a course of steroids which have been prescribed you, take your next dose tomorrow morning as you have received today's dose here.  You may continue trying a heating pad for 20 minutes 3 times daily.  I recommend follow-up with Dr. Aline Brochure for further evaluation if your symptoms persist.  He may recommend other treatments including physical therapy if your symptoms are not improving with this plan.

## 2021-09-19 ENCOUNTER — Encounter (HOSPITAL_COMMUNITY): Payer: Self-pay | Admitting: Radiology

## 2021-09-26 DIAGNOSIS — N281 Cyst of kidney, acquired: Secondary | ICD-10-CM | POA: Diagnosis not present

## 2021-09-26 DIAGNOSIS — M25551 Pain in right hip: Secondary | ICD-10-CM | POA: Diagnosis not present

## 2021-09-26 DIAGNOSIS — N1831 Chronic kidney disease, stage 3a: Secondary | ICD-10-CM | POA: Diagnosis not present

## 2021-09-26 DIAGNOSIS — N83209 Unspecified ovarian cyst, unspecified side: Secondary | ICD-10-CM | POA: Diagnosis not present

## 2021-09-26 DIAGNOSIS — E875 Hyperkalemia: Secondary | ICD-10-CM | POA: Diagnosis not present

## 2021-10-03 DIAGNOSIS — E875 Hyperkalemia: Secondary | ICD-10-CM | POA: Diagnosis not present

## 2021-10-03 DIAGNOSIS — N1831 Chronic kidney disease, stage 3a: Secondary | ICD-10-CM | POA: Diagnosis not present

## 2021-10-27 ENCOUNTER — Encounter (HOSPITAL_COMMUNITY): Payer: Self-pay

## 2021-10-27 ENCOUNTER — Emergency Department (HOSPITAL_COMMUNITY): Payer: Medicare HMO

## 2021-10-27 ENCOUNTER — Other Ambulatory Visit: Payer: Self-pay

## 2021-10-27 ENCOUNTER — Emergency Department (HOSPITAL_COMMUNITY)
Admission: EM | Admit: 2021-10-27 | Discharge: 2021-10-27 | Disposition: A | Discharge status: Home / Self Care | Payer: Medicare HMO | Attending: Emergency Medicine | Admitting: Emergency Medicine

## 2021-10-27 DIAGNOSIS — S82092A Other fracture of left patella, initial encounter for closed fracture: Secondary | ICD-10-CM | POA: Insufficient documentation

## 2021-10-27 DIAGNOSIS — M25562 Pain in left knee: Secondary | ICD-10-CM | POA: Diagnosis not present

## 2021-10-27 DIAGNOSIS — Y92512 Supermarket, store or market as the place of occurrence of the external cause: Secondary | ICD-10-CM | POA: Insufficient documentation

## 2021-10-27 DIAGNOSIS — S82002A Unspecified fracture of left patella, initial encounter for closed fracture: Secondary | ICD-10-CM | POA: Diagnosis not present

## 2021-10-27 DIAGNOSIS — M7989 Other specified soft tissue disorders: Secondary | ICD-10-CM | POA: Insufficient documentation

## 2021-10-27 DIAGNOSIS — W01198A Fall on same level from slipping, tripping and stumbling with subsequent striking against other object, initial encounter: Secondary | ICD-10-CM | POA: Diagnosis not present

## 2021-10-27 DIAGNOSIS — W19XXXA Unspecified fall, initial encounter: Secondary | ICD-10-CM

## 2021-10-27 DIAGNOSIS — S8992XA Unspecified injury of left lower leg, initial encounter: Secondary | ICD-10-CM | POA: Diagnosis present

## 2021-10-27 MED ORDER — OXYCODONE-ACETAMINOPHEN 5-325 MG PO TABS: 1.0000 | ORAL_TABLET | ORAL | 0 refills | Status: DC | PRN

## 2021-10-27 MED ORDER — OXYCODONE-ACETAMINOPHEN 5-325 MG PO TABS
1.0000 | ORAL_TABLET | Freq: Once | ORAL | Status: AC
  Administered 2021-10-27: 1 via ORAL
  Filled 2021-10-27: qty 1

## 2021-10-27 NOTE — ED Triage Notes (Signed)
Pt to er, pt states that she tripped coming out of the drug store, states that she has L knee pain.  Pt denies dizziness, states that she tripped where they were fixing the walk way.  Pt denies LOC, pt oriented

## 2021-10-27 NOTE — Discharge Instructions (Addendum)
Return if any problems.  Schedule to see the Orthopaedist for evaltuion

## 2021-10-27 NOTE — ED Notes (Signed)
Patient transported to X-ray 

## 2021-10-28 NOTE — ED Provider Notes (Signed)
[facility] EMERGENCY DEPARTMENT Provider Note   CSN: [id number] Arrival date & time: 10/27/21  1320     History  Chief Complaint  Patient presents with   Brittany Holden is a 88 y.o. female.  Patient reports she tripped and fell coming out of a store and struck both of her knees.  Patient reports her right knee feels fine however her left knee is swollen and painful.  Patient reports she has not been able to tolerate weightbearing since the fall patient denies any impact of her head she had no loss of consciousness patient denies any neck or back pain no hip pain.  The history is provided by the patient and a relative. No language interpreter was used.  Fall This is a new problem. The current episode started less than 1 hour ago. The problem occurs constantly. The problem has been gradually worsening. Nothing aggravates the symptoms. Nothing relieves the symptoms. She has tried nothing for the symptoms.      Home Medications Prior to Admission medications   Medication Sig Start Date End Date Taking? Authorizing Provider  oxyCODONE-acetaminophen (PERCOCET) 5-325 MG tablet Take 1 tablet by mouth every 4 (four) hours as needed for severe pain. 10/27/21 10/27/22 Yes Fransico Meadow, PA-C  chlorthalidone (HYGROTON) 25 MG tablet Take 25 mg by mouth every other day. 11/02/20 11/02/21  [provider]  Cyanocobalamin (VITAMIN B-12 CR PO) Take by mouth.    [provider]  HYDROcodone-acetaminophen (NORCO) 5-325 MG tablet 1 tab po q6 hours prn pain 05/18/21   [name], MD  levothyroxine (SYNTHROID, LEVOTHROID) 75 MCG tablet Take 75 mcg by mouth daily.      [provider]  lidocaine (LIDODERM) 5 % Place 1 patch onto the skin daily. Remove & Discard patch within 12 hours or as directed by MD 05/21/21   Dorie Rank, MD  metoprolol succinate (TOPROL XL) 25 MG 24 hr tablet Take 1 tablet (25 mg total) by mouth daily. 04/20/20   Fay Records, MD  Multiple  Minerals-Vitamins (CALCIUM & VIT D3 BONE HEALTH PO) Take 1 tablet by mouth daily.    [provider]  predniSONE (DELTASONE) 10 MG tablet 5, 4, 3, 2 then 1 tablet by mouth daily for 5 days total. 08/30/21   Idol, Almyra Free, PA-C  simvastatin (ZOCOR) 20 MG tablet Take 20 mg by mouth every morning.     [provider]      Allergies    Patient has no known allergies.    Review of Systems   Review of Systems  All other systems reviewed and are negative.  Physical Exam Updated Vital Signs BP (!) 158/67 (BP Location: Right Arm)    Pulse 69    Temp 98 F (36.7 C) (Oral)    Resp 16    Ht 6' (1.829 m)    Wt 56.7 kg    SpO2 97%    BMI 16.95 kg/m  Physical Exam Vitals and nursing note reviewed.  Constitutional:      Appearance: She is well-developed.  HENT:     Head: Normocephalic.  Cardiovascular:     Rate and Rhythm: Normal rate.  Pulmonary:     Effort: Pulmonary effort is normal.  Abdominal:     General: There is no distension.  Musculoskeletal:        General: Swelling and tenderness present.     Cervical back: Normal range of motion.     Comments: Swollen tender  left knee.  Bruising and swelling over patella.  Pain with range of motion.  Right knee full range of motion nontender.  Skin:    General: Skin is warm.  Neurological:     Mental Status: She is alert and oriented to person, place, and time.  Psychiatric:        Mood and Affect: Mood normal.    ED Results / Procedures / Treatments   Labs (all labs ordered are listed, but only abnormal results are displayed) Labs Reviewed - No data to display  EKG None  Radiology DG Knee Complete 4 Views Left  Result Date: 10/27/2021 CLINICAL DATA:  Left knee pain EXAM: LEFT KNEE - COMPLETE 4+ VIEW COMPARISON:  Left knee radiograph 09/06/2014 FINDINGS: There is a minimally displaced patellar fracture and a large joint effusion. Prepatellar soft tissue swelling. IMPRESSION: Minimally displaced patella fracture.  Large  joint effusion. Electronically Signed   By: Maurine Simmering M.D.   On: 10/27/2021 15:29    Procedures Procedures    Medications Ordered in ED Medications  oxyCODONE-acetaminophen (PERCOCET/ROXICET) 5-325 MG per tablet 1 tablet (1 tablet Oral Given 10/27/21 1610)    ED Course/ Medical Decision Making/ A&P                           Medical Decision Making Patient fell striking bilateral knees on the ground patient complains of pain in left knee  Problems Addressed: Fall, initial encounter: acute illness or injury  Amount and/or Complexity of Data Reviewed Independent Historian:     Details: Patient has a family member who is present with her Radiology: ordered and independent interpretation performed. Decision-making details documented in ED Course.    Details: X-ray of left knee shows minimally displaced left patella fracture  Risk Prescription drug management. Risk Details: Patient placed in a knee immobilizer.  Patient has crutches she is and a walker at home.  Patient is counseled on the fracture and advised to follow-up with orthopedics           Final Clinical Impression(s) / ED Diagnoses Final diagnoses:  Fall, initial encounter  Closed nondisplaced fracture of left patella, unspecified fracture morphology, initial encounter    Rx / DC Orders ED Discharge Orders          Ordered    oxyCODONE-acetaminophen (PERCOCET) 5-325 MG tablet  Every 4 hours PRN        10/27/21 1610           An After Visit Summary was printed and given to the patient.    Sidney Ace 10/28/21 2339    Wyvonnia Dusky, MD 10/29/21 [phone]

## 2021-10-31 ENCOUNTER — Ambulatory Visit: Payer: Medicare HMO | Admitting: Orthopedic Surgery

## 2021-10-31 ENCOUNTER — Encounter: Payer: Self-pay | Admitting: Orthopedic Surgery

## 2021-10-31 ENCOUNTER — Ambulatory Visit: Payer: Medicare HMO

## 2021-10-31 ENCOUNTER — Other Ambulatory Visit: Payer: Self-pay

## 2021-10-31 ENCOUNTER — Other Ambulatory Visit: Payer: Self-pay | Admitting: Orthopedic Surgery

## 2021-10-31 VITALS — BP 132/80 | HR 97 | Ht 72.0 in | Wt 125.0 lb

## 2021-10-31 DIAGNOSIS — S82002A Unspecified fracture of left patella, initial encounter for closed fracture: Secondary | ICD-10-CM | POA: Diagnosis not present

## 2021-10-31 DIAGNOSIS — Z01818 Encounter for other preprocedural examination: Secondary | ICD-10-CM

## 2021-10-31 NOTE — H&P (View-Only) (Signed)
New Patient Visit  Assessment: Brittany Holden is a 88 y.o. female with the following: 1. Closed displaced fracture of left patella, unspecified fracture morphology, initial encounter  Plan: Patient sustained a comminuted, but minimally displaced fracture of the left patella.  On physical exam, she has bruising and swelling about the left knee.  Despite her best efforts, she is unable to straighten her leg.  As a result, I recommended operative fixation.  She is an active older adult, and this will give her the best chance to return to her previous level of function.  Risks and benefits of surgery, including, but not limited to infection, bleeding, persistent pain, damage to surrounding structures, need for further surgery, malunion, nonunion and more severe complications associated with anesthesia were discussed.  All questions have been answered and they have elected to proceed with surgery.   Outpatient surgery will be scheduled for November 06, 2021.  Follow-up: Return for After surgery; approximately 1 week postop.  Subjective:  Chief Complaint  Patient presents with   New Patient (Initial Visit)   Knee Injury    Closed nondisplaced fracture LEFT patella DOI 10/27/21    History of Present Illness: Brittany Holden is a 88 y.o. female who presents for evaluation of her left knee.  She states that she lost her balance, and fell approximately 5 days ago.  She landed directly onto her left knee.  She presented to the emergency department, where x-rays demonstrated a fracture of the left patella.  Her pain has been controlled.  She has remained in a knee immobilizer, although it is too small for her.  She has been ambulating with the assistance of a walker.  She is a very active older patient otherwise.  She states that she works out at [employer] on a regular basis.  She also does all of her yard work, with little assistance.   Review of Systems: No fevers or chills No numbness or tingling No  chest pain No shortness of breath No bowel or bladder dysfunction No GI distress No headaches   Medical History:  Past Medical History:  Diagnosis Date   Arrhythmia    Diverticulosis of colon (without mention of hemorrhage)    Hypothyroidism    IBS (irritable bowel syndrome)    Neuropathy    Personal history of colonic polyps 1999 & 2007   hyperplastic     Past Surgical History:  Procedure Laterality Date   ABDOMINAL HYSTERECTOMY     FOOT ARTHRODESIS     pipj digits  2-4 left foot   TENDON REPAIR Right 05/18/2021   Procedure: RIGHT RING AND RIGHT SMALL TENDON TRANSFER VS EXTENSOR INDICIS PROPRIUS TO RING AND SMALL FINGER TRANSFER; DISTAL RADIOULNAR JOINT DEBRIDEMENT ULNA  DISTAL RESECTION WITH STABILIZATION;  Surgeon: [name], MD;  Location: [facility];  Service: Orthopedics;  Laterality: Right;   TONSILLECTOMY  06/26/2012   Procedure: TONSILLECTOMY;  Surgeon: Ascencion Dike, MD;  Location: [facility];  Service: ENT;  Laterality: Right;   VAGINAL HYSTERECTOMY      Family History  Problem Relation Age of Onset   CAD Neg Hx    Breast cancer Neg Hx    Social History   Tobacco Use   Smoking status: Never   Smokeless tobacco: Never  Vaping Use   Vaping Use: Never used  Substance Use Topics   Alcohol use: No   Drug use: No    No Known Allergies  Current Meds  Medication Sig  chlorthalidone (HYGROTON) 25 MG tablet Take 25 mg by mouth every other day.   Cyanocobalamin (VITAMIN B-12 CR PO) Take by mouth.   HYDROcodone-acetaminophen (NORCO) 5-325 MG tablet 1 tab po q6 hours prn pain   levothyroxine (SYNTHROID, LEVOTHROID) 75 MCG tablet Take 75 mcg by mouth daily.     lidocaine (LIDODERM) 5 % Place 1 patch onto the skin daily. Remove & Discard patch within 12 hours or as directed by MD   metoprolol succinate (TOPROL XL) 25 MG 24 hr tablet Take 1 tablet (25 mg total) by mouth daily.   Multiple Minerals-Vitamins (CALCIUM & VIT D3 BONE HEALTH PO) Take 1 tablet  by mouth daily.   oxyCODONE-acetaminophen (PERCOCET) 5-325 MG tablet Take 1 tablet by mouth every 4 (four) hours as needed for severe pain.   predniSONE (DELTASONE) 10 MG tablet 5, 4, 3, 2 then 1 tablet by mouth daily for 5 days total.   simvastatin (ZOCOR) 20 MG tablet Take 20 mg by mouth every morning.     Objective: BP 132/80    Pulse 97    Ht 6' (1.829 m)    Wt 125 lb (56.7 kg)    BMI 16.95 kg/m   Physical Exam:  General: Elderly female., Alert and oriented., and No acute distress. Gait: Ambulates with the assistance of a walker.  Evaluation of the left knee demonstrates bruising, and various degrees of healing.  She has some swelling about the knee.  Tenderness to palpation over the patella.  She is unable to fully extend her knee against gravity.  Toes are warm and well perfused.  Active motion intact in the EHL/TA.  IMAGING: I personally ordered and reviewed the following images  X-rays of the left knee demonstrates a comminuted, but minimally displaced fracture of the left patella.  There is some gapping in both the AP and lateral views.  Knee appears to be healthy otherwise.  Mild degenerative changes otherwise.  No additional injuries are noted.  Impression: Minimally displaced, comminuted left patella fracture   New Medications:  No orders of the defined types were placed in this encounter.     Mordecai Rasmussen, MD  10/31/2021 4:11 PM

## 2021-10-31 NOTE — Progress Notes (Signed)
New Patient Visit  Assessment: Brittany Holden is a 88 y.o. female with the following: 1. Closed displaced fracture of left patella, unspecified fracture morphology, initial encounter  Plan: Patient sustained a comminuted, but minimally displaced fracture of the left patella.  On physical exam, she has bruising and swelling about the left knee.  Despite her best efforts, she is unable to straighten her leg.  As a result, I recommended operative fixation.  She is an active older adult, and this will give her the best chance to return to her previous level of function.  Risks and benefits of surgery, including, but not limited to infection, bleeding, persistent pain, damage to surrounding structures, need for further surgery, malunion, nonunion and more severe complications associated with anesthesia were discussed.  All questions have been answered and they have elected to proceed with surgery.   Outpatient surgery will be scheduled for November 06, 2021.  Follow-up: Return for After surgery; approximately 1 week postop.  Subjective:  Chief Complaint  Patient presents with   New Patient (Initial Visit)   Knee Injury    Closed nondisplaced fracture LEFT patella DOI 10/27/21    History of Present Illness: Brittany Holden is a 88 y.o. female who presents for evaluation of her left knee.  She states that she lost her balance, and fell approximately 5 days ago.  She landed directly onto her left knee.  She presented to the emergency department, where x-rays demonstrated a fracture of the left patella.  Her pain has been controlled.  She has remained in a knee immobilizer, although it is too small for her.  She has been ambulating with the assistance of a walker.  She is a very active older patient otherwise.  She states that she works out at [employer] on a regular basis.  She also does all of her yard work, with little assistance.   Review of Systems: No fevers or chills No numbness or tingling No  chest pain No shortness of breath No bowel or bladder dysfunction No GI distress No headaches   Medical History:  Past Medical History:  Diagnosis Date   Arrhythmia    Diverticulosis of colon (without mention of hemorrhage)    Hypothyroidism    IBS (irritable bowel syndrome)    Neuropathy    Personal history of colonic polyps 1999 & 2007   hyperplastic     Past Surgical History:  Procedure Laterality Date   ABDOMINAL HYSTERECTOMY     FOOT ARTHRODESIS     pipj digits  2-4 left foot   TENDON REPAIR Right 05/18/2021   Procedure: RIGHT RING AND RIGHT SMALL TENDON TRANSFER VS EXTENSOR INDICIS PROPRIUS TO RING AND SMALL FINGER TRANSFER; DISTAL RADIOULNAR JOINT DEBRIDEMENT ULNA  DISTAL RESECTION WITH STABILIZATION;  Surgeon: [name], MD;  Location: [facility];  Service: Orthopedics;  Laterality: Right;   TONSILLECTOMY  06/26/2012   Procedure: TONSILLECTOMY;  Surgeon: Ascencion Dike, MD;  Location: [facility];  Service: ENT;  Laterality: Right;   VAGINAL HYSTERECTOMY      Family History  Problem Relation Age of Onset   CAD Neg Hx    Breast cancer Neg Hx    Social History   Tobacco Use   Smoking status: Never   Smokeless tobacco: Never  Vaping Use   Vaping Use: Never used  Substance Use Topics   Alcohol use: No   Drug use: No    No Known Allergies  Current Meds  Medication Sig  chlorthalidone (HYGROTON) 25 MG tablet Take 25 mg by mouth every other day.   Cyanocobalamin (VITAMIN B-12 CR PO) Take by mouth.   HYDROcodone-acetaminophen (NORCO) 5-325 MG tablet 1 tab po q6 hours prn pain   levothyroxine (SYNTHROID, LEVOTHROID) 75 MCG tablet Take 75 mcg by mouth daily.     lidocaine (LIDODERM) 5 % Place 1 patch onto the skin daily. Remove & Discard patch within 12 hours or as directed by MD   metoprolol succinate (TOPROL XL) 25 MG 24 hr tablet Take 1 tablet (25 mg total) by mouth daily.   Multiple Minerals-Vitamins (CALCIUM & VIT D3 BONE HEALTH PO) Take 1 tablet  by mouth daily.   oxyCODONE-acetaminophen (PERCOCET) 5-325 MG tablet Take 1 tablet by mouth every 4 (four) hours as needed for severe pain.   predniSONE (DELTASONE) 10 MG tablet 5, 4, 3, 2 then 1 tablet by mouth daily for 5 days total.   simvastatin (ZOCOR) 20 MG tablet Take 20 mg by mouth every morning.     Objective: BP 132/80    Pulse 97    Ht 6' (1.829 m)    Wt 125 lb (56.7 kg)    BMI 16.95 kg/m   Physical Exam:  General: Elderly female., Alert and oriented., and No acute distress. Gait: Ambulates with the assistance of a walker.  Evaluation of the left knee demonstrates bruising, and various degrees of healing.  She has some swelling about the knee.  Tenderness to palpation over the patella.  She is unable to fully extend her knee against gravity.  Toes are warm and well perfused.  Active motion intact in the EHL/TA.  IMAGING: I personally ordered and reviewed the following images  X-rays of the left knee demonstrates a comminuted, but minimally displaced fracture of the left patella.  There is some gapping in both the AP and lateral views.  Knee appears to be healthy otherwise.  Mild degenerative changes otherwise.  No additional injuries are noted.  Impression: Minimally displaced, comminuted left patella fracture   New Medications:  No orders of the defined types were placed in this encounter.     Mordecai Rasmussen, MD  10/31/2021 4:11 PM

## 2021-11-01 NOTE — Patient Instructions (Addendum)
[place]  11/01/2021     @PREFPERIOPPHARMACY @   Your procedure is scheduled on  11/06/2021.   Report to [facility] at  1130  A.M.   Call this number if you have problems the morning of surgery:  [phone]   Remember:  Do not eat  after midnight.   You may drink clear liquids until  0900 AM on 11/06/2021.    Clear liquids allowed are:                    Water, Juice (non-citric and without pulp - diabetics please choose diet or no sugar options), Carbonated beverages - (diabetics please choose diet or no sugar options), Clear Tea, Black Coffee only (no creamer, milk or cream including half and half), Plain Jell-O only (diabetics please choose diet or no sugar options), Gatorade (diabetics please choose diet or no sugar options), and Plain Popsicles only    At 0900 drink your carb drink. You can have nothing else to drink after this.     Take these medicines the morning of surgery with A SIP OF WATER           hydrocodone or oxycodone (If needed), levothyroxine, metoprolol, prednisone.     Do not wear jewelry, make-up or nail polish.  Do not wear lotions, powders, or perfumes, or deodorant.  Do not shave 48 hours prior to surgery.  Men may shave face and neck.  Do not bring valuables to the hospital.  [facility] is not responsible for any belongings or valuables.  Contacts, dentures or bridgework may not be worn into surgery.  Leave your suitcase in the car.  After surgery it may be brought to your room.  For patients admitted to the hospital, discharge time will be determined by your treatment team.  Patients discharged the day of surgery will not be allowed to drive home and must have someone with them for 24 hours.    Special instructions:   DO NOT smoke tobacco or vape for 24 hours before your procedure.  Please read over the following fact sheets that you were given. Coughing and Deep Breathing, Surgical Site Infection Prevention, Anesthesia Post-op  Instructions, and Care and Recovery After Surgery       Incision Care, Adult An incision is a surgical cut that is made through your skin. Most incisions are closed after a surgical procedure. Your incision may be closed with stitches (sutures), staples, skin glue, or adhesive strips. You may need to return to your health care provider to have sutures or staples removed. This may occur several days or several weeks after your surgery. Until then, the incision needs to be cared for properly to prevent infection. Follow instructions from your health care provider about how to care for your incision. Supplies needed: Soap, water, and a clean hand towel. Wound cleanser. A clean bandage (dressing), if needed. Cream or ointment, if told by your health care provider. Clean gauze. How to care for your incision Cleaning the incision Ask your health care provider how to clean the incision. This may include: Wearing medical gloves. Using mild soap and water, or wound cleanser. Using a clean gauze to pat the incision dry after cleaning it. Dressing changes Wash your hands with soap and water for at least 20 seconds before and after you change the dressing. If soap and water are not available, use hand sanitizer. Do not use disinfectants or antiseptics, such as  rubbing alcohol, to clean open wounds unless told by your health care provider. Change your dressing as told by your health care provider. Leave sutures, staples, skin glue, or adhesive strips in place. These skin closures may need to stay in place for 2 weeks or longer. If adhesive strip edges start to loosen and curl up, you may trim the loose edges. Do not remove adhesive strips completely unless your health care provider tells you to do that. Apply cream or ointment. Do this only as told by your health care provider. Cover the incision with a clean dressing. Ask your health care provider when you can start to leave the incision  uncovered. Checking for infection Check your incision area every day for signs of infection. Check for: More redness, swelling, or pain. More fluid or blood. New warmth, hardness, or a rash that develops along the incision. Pus or a bad smell.  Follow these instructions at home Medicines Take over-the-counter and prescription medicines only as told by your health care provider. If you were prescribed an antibiotic medicine, cream, or ointment, take or apply it as told by your health care provider. Do not stop using the antibiotic even if your condition improves. Eating and drinking Eat a diet that includes protein, vitamin A, vitamin C, and other nutrient-rich foods to help the wound heal. Foods rich in protein include meat, fish, eggs, dairy, beans, nuts, and protein supplement drinks. Foods rich in vitamin A include carrots and dark green, leafy vegetables. Foods rich in vitamin C include citrus fruits, tomatoes, broccoli, and peppers. Drink enough fluid to keep your urine pale yellow. General instructions  Do not take baths, swim, use a hot tub, or do anything that would put the incision underwater until your health care provider approves. Ask your health care provider if you may take showers. You may only be allowed to take sponge baths. Limit movement around your incision to promote healing. Avoid straining, lifting, or exercising for the first 2 weeks after your procedure, or for as long as told by your health care provider. Return to your normal activities as told by your health care provider. Ask your health care provider what activities are safe for you. Do not scratch, pick, or scrub the incision. Keep it covered as told by your health care provider. Protect your incision from the sun when you are outside for the first 6 months, or for as long as told by your health care provider. Cover up the scar area or apply sunscreen that has an SPF of at least 63. Do not use any products  that contain nicotine or tobacco, such as cigarettes, e-cigarettes, and chewing tobacco. These can delay incision healing. If you need help quitting, ask your health care provider. Keep all follow-up visits. This is important. Contact a health care provider if: You have any of these signs of infection: More redness, swelling, or pain around your incision. More fluid or blood coming from your incision. New warmth or hardness around your incision. Pus or a bad smell coming from your incision. A rash that develops along the incision. You feel nauseous or you vomit. You are dizzy. Your sutures, staples, skin glue, or adhesive strips come undone. Your wound gets bigger. You have a fever. Get help right away if: Your incision bleeds through the dressing and the bleeding does not stop with gentle pressure. The edges of your incision open up and separate. These symptoms may represent a serious problem that is an emergency. Do not  wait to see if the symptoms will go away. Get medical help right away. Call your local emergency services (911 in the [place] Do not drive yourself to the hospital. Summary Follow instructions from your health care provider about how to care for your incision. Wash your hands with soap and water for at least 20 seconds before and after you change the dressing. If soap and water are not available, use hand sanitizer. Check your incision area every day for signs of infection. Keep all follow-up visits. This is important. This information is not intended to replace advice given to you by your health care provider. Make sure you discuss any questions you have with your health care provider. Document Revised: 11/21/2020 Document Reviewed: 11/21/2020 Elsevier Patient Education  [place]. Patellar Dislocation Surgery, Care After The following information offers guidance on how to care for yourself after your procedure. Your health care provider may also give you more  specific instructions. If you have problems or questions, contact your health care provider. What can I expect after the procedure? After the procedure, it is common to have pain, swelling, and bruising. Follow these instructions at home: Medicines Take over-the-counter and prescription medicines only as told by your health care provider. Ask your health care provider if the medicine prescribed to you: Requires you to avoid driving or using machinery. Can cause constipation. You may need to take these actions to prevent or treat constipation: Drink enough fluid to keep your urine pale yellow. Take over-the-counter or prescription medicines. Eat foods that are high in fiber, such as beans, whole grains, and fresh fruits and vegetables. Limit foods that are high in fat and processed sugars, such as fried or sweet foods. If you have a removable brace: Wear the brace as told by your health care provider. Remove it only as told by your health care provider. Check the skin around the brace every day. Tell your health care provider about any concerns. Loosen the brace if your toes tingle, become numb, or turn cold and blue. Keep the brace clean. If the brace is not waterproof: Do not let it get wet. Cover it with a watertight covering when you take a bath or shower. Incision care  Follow instructions from your health care provider about how to take care of your incision or incisions. Make sure you: Wash your hands with soap and water for at least 20 seconds before and after you change your bandage (dressing). If soap and water are not available, use hand sanitizer. Change your dressing as told by your health care provider Leave stitches (sutures), staples, skin glue, or adhesive strips in place. These skin closures may need to stay in place for 2 weeks or longer. If adhesive strip edges start to loosen and curl up, you may trim the loose edges. Do not remove adhesive strips completely unless your  health care provider tells you to do that. Check your incision areas every day for signs of infection. Check for: More redness, swelling, or pain. Fluid or blood. Warmth. Pus or a bad smell. Do not take baths, swim, or use a hot tub until your health care provider approves. Ask your health care provider if you may take showers. You may only be allowed to take sponge baths. Managing pain, stiffness, and swelling  If directed, put ice on the injured area. To do this: If you have a removable brace, remove it as told by your health care provider. Put ice in a plastic bag. Place  a towel between your skin and the bag. Leave the ice on for 20 minutes, 2-3 times a day. Remove the ice if your skin turns bright red. This is very important. If you cannot feel pain, heat, or cold, you have a greater risk of damage to the area. Move your toes often to reduce stiffness and swelling. Raise (elevate) your knee above the level of your heart while you are sitting or lying down. Activity If you were given a sedative during the procedure, it can affect you for several hours. Do not drive or operate machinery until your health care provider says that it is safe. Do not use your leg to support your body weight until your health care provider says that you can. Use crutches or a walker as told by your health care provider. Do exercises as told by your health care provider. Return to your normal activities as told by your health care provider. Ask your health care provider what activities are safe for you. General instructions Do not use any products that contain nicotine or tobacco. These products include cigarettes, chewing tobacco, and vaping devices, such as e-cigarettes. These can delay healing after surgery. If you need help quitting, ask your health care provider. Keep all follow-up visits. This is important. Contact a health care provider if: You have any of these signs of infection: More redness,  swelling, or pain around an incision. Fluid or blood coming from an incision. Warmth coming from an incision. Pus or a bad smell coming from an incision. A fever or chills. Your pain is not controlled with medicine. Your toes tingle, become numb, or turn cold and blue. Get help right away if: You have pain in your calf or behind your knee. You have chest pain. You have difficulty breathing. These symptoms may be an emergency. Get help right away. Call 911. Do not wait to see if the symptoms will go away. Do not drive yourself to the hospital. Summary After your procedure, it is common to have pain, swelling, or bruising. Follow instructions from your health care provider about how to take care of your incision or incisions. Take over-the-counter and prescription medicines only as told by your health care provider. Do not use your leg to support your body weight until your health care provider says that you can. Use crutches or a walker as told by your health care provider. Contact a health care provider if you have signs of infection. This information is not intended to replace advice given to you by your health care provider. Make sure you discuss any questions you have with your health care provider. Document Revised: 04/13/2021 Document Reviewed: 04/13/2021 Elsevier Patient Education  2022 [employer] Anesthesia, Adult, Care After This sheet gives you information about how to care for yourself after your procedure. Your health care provider may also give you more specific instructions. If you have problems or questions, contact your health care provider. What can I expect after the procedure? After the procedure, the following side effects are common: Pain or discomfort at the IV site. Nausea. Vomiting. Sore throat. Trouble concentrating. Feeling cold or chills. Feeling weak or tired. Sleepiness and fatigue. Soreness and body aches. These side effects can affect parts  of the body that were not involved in surgery. Follow these instructions at home: For the time period you were told by your health care provider:  Rest. Do not participate in activities where you could fall or become injured. Do not drive or  use machinery. Do not drink alcohol. Do not take sleeping pills or medicines that cause drowsiness. Do not make important decisions or sign legal documents. Do not take care of children on your own. Eating and drinking Follow any instructions from your health care provider about eating or drinking restrictions. When you feel hungry, start by eating small amounts of foods that are soft and easy to digest (bland), such as toast. Gradually return to your regular diet. Drink enough fluid to keep your urine pale yellow. If you vomit, rehydrate by drinking water, juice, or clear broth. General instructions If you have sleep apnea, surgery and certain medicines can increase your risk for breathing problems. Follow instructions from your health care provider about wearing your sleep device: Anytime you are sleeping, including during daytime naps. While taking prescription pain medicines, sleeping medicines, or medicines that make you drowsy. Have a responsible adult stay with you for the time you are told. It is important to have someone help care for you until you are awake and alert. Return to your normal activities as told by your health care provider. Ask your health care provider what activities are safe for you. Take over-the-counter and prescription medicines only as told by your health care provider. If you smoke, do not smoke without supervision. Keep all follow-up visits as told by your health care provider. This is important. Contact a health care provider if: You have nausea or vomiting that does not get better with medicine. You cannot eat or drink without vomiting. You have pain that does not get better with medicine. You are unable to pass  urine. You develop a skin rash. You have a fever. You have redness around your IV site that gets worse. Get help right away if: You have difficulty breathing. You have chest pain. You have blood in your urine or stool, or you vomit blood. Summary After the procedure, it is common to have a sore throat or nausea. It is also common to feel tired. Have a responsible adult stay with you for the time you are told. It is important to have someone help care for you until you are awake and alert. When you feel hungry, start by eating small amounts of foods that are soft and easy to digest (bland), such as toast. Gradually return to your regular diet. Drink enough fluid to keep your urine pale yellow. Return to your normal activities as told by your health care provider. Ask your health care provider what activities are safe for you. This information is not intended to replace advice given to you by your health care provider. Make sure you discuss any questions you have with your health care provider. Document Revised: 05/05/2020 Document Reviewed: 12/03/2019 Elsevier Patient Education  2022 [place]. How to Use Chlorhexidine for Bathing Chlorhexidine gluconate (CHG) is a germ-killing (antiseptic) solution that is used to clean the skin. It can get rid of the bacteria that normally live on the skin and can keep them away for about 24 hours. To clean your skin with CHG, you may be given: A CHG solution to use in the shower or as part of a sponge bath. A prepackaged cloth that contains CHG. Cleaning your skin with CHG may help lower the risk for infection: While you are staying in the intensive care unit of the hospital. If you have a vascular access, such as a central line, to provide short-term or long-term access to your veins. If you have a catheter to drain urine  from your bladder. If you are on a ventilator. A ventilator is a machine that helps you breathe by moving air in and out of your  lungs. After surgery. What are the risks? Risks of using CHG include: A skin reaction. Hearing loss, if CHG gets in your ears and you have a perforated eardrum. Eye injury, if CHG gets in your eyes and is not rinsed out. The CHG product catching fire. Make sure that you avoid smoking and flames after applying CHG to your skin. Do not use CHG: If you have a chlorhexidine allergy or have previously reacted to chlorhexidine. On babies younger than 52 months of age. How to use CHG solution Use CHG only as told by your health care provider, and follow the instructions on the label. Use the full amount of CHG as directed. Usually, this is one bottle. During a shower Follow these steps when using CHG solution during a shower (unless your health care provider gives you different instructions): Start the shower. Use your normal soap and shampoo to wash your face and hair. Turn off the shower or move out of the shower stream. Pour the CHG onto a clean washcloth. Do not use any type of brush or rough-edged sponge. Starting at your neck, lather your body down to your toes. Make sure you follow these instructions: If you will be having surgery, pay special attention to the part of your body where you will be having surgery. Scrub this area for at least 1 minute. Do not use CHG on your head or face. If the solution gets into your ears or eyes, rinse them well with water. Avoid your genital area. Avoid any areas of skin that have broken skin, cuts, or scrapes. Scrub your back and under your arms. Make sure to wash skin folds. Let the lather sit on your skin for 1-2 minutes or as long as told by your health care provider. Thoroughly rinse your entire body in the shower. Make sure that all body creases and crevices are rinsed well. Dry off with a clean towel. Do not put any substances on your body afterward--such as powder, lotion, or perfume--unless you are told to do so by your health care provider.  Only use lotions that are recommended by the manufacturer. Put on clean clothes or pajamas. If it is the night before your surgery, sleep in clean sheets.  During a sponge bath Follow these steps when using CHG solution during a sponge bath (unless your health care provider gives you different instructions): Use your normal soap and shampoo to wash your face and hair. Pour the CHG onto a clean washcloth. Starting at your neck, lather your body down to your toes. Make sure you follow these instructions: If you will be having surgery, pay special attention to the part of your body where you will be having surgery. Scrub this area for at least 1 minute. Do not use CHG on your head or face. If the solution gets into your ears or eyes, rinse them well with water. Avoid your genital area. Avoid any areas of skin that have broken skin, cuts, or scrapes. Scrub your back and under your arms. Make sure to wash skin folds. Let the lather sit on your skin for 1-2 minutes or as long as told by your health care provider. Using a different clean, wet washcloth, thoroughly rinse your entire body. Make sure that all body creases and crevices are rinsed well. Dry off with a clean towel. Do not  put any substances on your body afterward--such as powder, lotion, or perfume--unless you are told to do so by your health care provider. Only use lotions that are recommended by the manufacturer. Put on clean clothes or pajamas. If it is the night before your surgery, sleep in clean sheets. How to use CHG prepackaged cloths Only use CHG cloths as told by your health care provider, and follow the instructions on the label. Use the CHG cloth on clean, dry skin. Do not use the CHG cloth on your head or face unless your health care provider tells you to. When washing with the CHG cloth: Avoid your genital area. Avoid any areas of skin that have broken skin, cuts, or scrapes. Before surgery Follow these steps when using a  CHG cloth to clean before surgery (unless your health care provider gives you different instructions): Using the CHG cloth, vigorously scrub the part of your body where you will be having surgery. Scrub using a back-and-forth motion for 3 minutes. The area on your body should be completely wet with CHG when you are done scrubbing. Do not rinse. Discard the cloth and let the area air-dry. Do not put any substances on the area afterward, such as powder, lotion, or perfume. Put on clean clothes or pajamas. If it is the night before your surgery, sleep in clean sheets.  For general bathing Follow these steps when using CHG cloths for general bathing (unless your health care provider gives you different instructions). Use a separate CHG cloth for each area of your body. Make sure you wash between any folds of skin and between your fingers and toes. Wash your body in the following order, switching to a new cloth after each step: The front of your neck, shoulders, and chest. Both of your arms, under your arms, and your hands. Your stomach and groin area, avoiding the genitals. Your right leg and foot. Your left leg and foot. The back of your neck, your back, and your buttocks. Do not rinse. Discard the cloth and let the area air-dry. Do not put any substances on your body afterward--such as powder, lotion, or perfume--unless you are told to do so by your health care provider. Only use lotions that are recommended by the manufacturer. Put on clean clothes or pajamas. Contact a health care provider if: Your skin gets irritated after scrubbing. You have questions about using your solution or cloth. You swallow any chlorhexidine. Call your local poison control center (1-905 879 3673 in the [place] Get help right away if: Your eyes itch badly, or they become very red or swollen. Your skin itches badly and is red or swollen. Your hearing changes. You have trouble seeing. You have swelling or tingling in  your mouth or throat. You have trouble breathing. These symptoms may represent a serious problem that is an emergency. Do not wait to see if the symptoms will go away. Get medical help right away. Call your local emergency services (911 in the [place] Do not drive yourself to the hospital. Summary Chlorhexidine gluconate (CHG) is a germ-killing (antiseptic) solution that is used to clean the skin. Cleaning your skin with CHG may help to lower your risk for infection. You may be given CHG to use for bathing. It may be in a bottle or in a prepackaged cloth to use on your skin. Carefully follow your health care provider's instructions and the instructions on the product label. Do not use CHG if you have a chlorhexidine allergy. Contact your health care  provider if your skin gets irritated after scrubbing. This information is not intended to replace advice given to you by your health care provider. Make sure you discuss any questions you have with your health care provider. Document Revised: 10/31/2020 Document Reviewed: 10/31/2020 Elsevier Patient Education  2022 [employer].

## 2021-11-02 ENCOUNTER — Encounter (HOSPITAL_COMMUNITY): Payer: Self-pay

## 2021-11-02 ENCOUNTER — Encounter (HOSPITAL_COMMUNITY)
Admission: RE | Admit: 2021-11-02 | Discharge: 2021-11-02 | Disposition: A | Discharge status: Home / Self Care | Payer: Medicare HMO | Source: Ambulatory Visit | Attending: Orthopedic Surgery | Admitting: Orthopedic Surgery

## 2021-11-02 DIAGNOSIS — Z01818 Encounter for other preprocedural examination: Secondary | ICD-10-CM

## 2021-11-02 DIAGNOSIS — Z79899 Other long term (current) drug therapy: Secondary | ICD-10-CM | POA: Insufficient documentation

## 2021-11-02 DIAGNOSIS — Z01812 Encounter for preprocedural laboratory examination: Secondary | ICD-10-CM | POA: Diagnosis not present

## 2021-11-02 HISTORY — DX: Essential (primary) hypertension: I10

## 2021-11-02 HISTORY — DX: Cardiac arrhythmia, unspecified: I49.9

## 2021-11-02 HISTORY — DX: Palpitations: R00.2

## 2021-11-02 LAB — BASIC METABOLIC PANEL
Anion gap: 9 (ref 5–15)
BUN: 27 mg/dL — ABNORMAL HIGH (ref 8–23)
CO2: 27 mmol/L (ref 22–32)
Calcium: 9.1 mg/dL (ref 8.9–10.3)
Chloride: 95 mmol/L — ABNORMAL LOW (ref 98–111)
Creatinine, Ser: 1.24 mg/dL — ABNORMAL HIGH (ref 0.44–1.00)
GFR, Estimated: 42 mL/min — ABNORMAL LOW (ref >60)
Glucose, Bld: 108 mg/dL — ABNORMAL HIGH (ref 70–99)
Potassium: 3.8 mmol/L (ref 3.5–5.1)
Sodium: 131 mmol/L — ABNORMAL LOW (ref 135–145)

## 2021-11-02 LAB — CBC
HCT: 32 % — ABNORMAL LOW (ref 36.0–46.0)
Hemoglobin: 10.5 g/dL — ABNORMAL LOW (ref 12.0–15.0)
MCH: 30.5 pg (ref 26.0–34.0)
MCHC: 32.8 g/dL (ref 30.0–36.0)
MCV: 93 fL (ref 80.0–100.0)
Platelets: 272 10*3/uL (ref 150–400)
RBC: 3.44 MIL/uL — ABNORMAL LOW (ref 3.87–5.11)
RDW: 11.9 % (ref 11.5–15.5)
WBC: 6.4 10*3/uL (ref 4.0–10.5)
nRBC: 0 % (ref 0.0–0.2)

## 2021-11-06 ENCOUNTER — Ambulatory Visit (HOSPITAL_COMMUNITY): Payer: Medicare HMO

## 2021-11-06 ENCOUNTER — Encounter (HOSPITAL_COMMUNITY): Payer: Self-pay | Admitting: Orthopedic Surgery

## 2021-11-06 ENCOUNTER — Other Ambulatory Visit: Payer: Self-pay

## 2021-11-06 ENCOUNTER — Encounter (HOSPITAL_COMMUNITY)
Admission: RE | Disposition: A | Discharge status: Home / Self Care | Payer: Self-pay | Source: Home / Self Care | Attending: Orthopedic Surgery

## 2021-11-06 ENCOUNTER — Ambulatory Visit (HOSPITAL_COMMUNITY)
Admission: RE | Admit: 2021-11-06 | Discharge: 2021-11-06 | Disposition: A | Discharge status: Home / Self Care | Payer: Medicare HMO | Attending: Orthopedic Surgery | Admitting: Orthopedic Surgery

## 2021-11-06 ENCOUNTER — Ambulatory Visit (HOSPITAL_BASED_OUTPATIENT_CLINIC_OR_DEPARTMENT_OTHER): Payer: Medicare HMO | Admitting: Anesthesiology

## 2021-11-06 ENCOUNTER — Ambulatory Visit (HOSPITAL_COMMUNITY): Payer: Medicare HMO | Admitting: Anesthesiology

## 2021-11-06 DIAGNOSIS — M7989 Other specified soft tissue disorders: Secondary | ICD-10-CM | POA: Diagnosis not present

## 2021-11-06 DIAGNOSIS — S82042A Displaced comminuted fracture of left patella, initial encounter for closed fracture: Secondary | ICD-10-CM | POA: Insufficient documentation

## 2021-11-06 DIAGNOSIS — W010XXA Fall on same level from slipping, tripping and stumbling without subsequent striking against object, initial encounter: Secondary | ICD-10-CM | POA: Diagnosis not present

## 2021-11-06 DIAGNOSIS — I10 Essential (primary) hypertension: Secondary | ICD-10-CM | POA: Diagnosis not present

## 2021-11-06 DIAGNOSIS — S82002A Unspecified fracture of left patella, initial encounter for closed fracture: Secondary | ICD-10-CM

## 2021-11-06 DIAGNOSIS — S8002XA Contusion of left knee, initial encounter: Secondary | ICD-10-CM | POA: Diagnosis not present

## 2021-11-06 DIAGNOSIS — E039 Hypothyroidism, unspecified: Secondary | ICD-10-CM | POA: Diagnosis not present

## 2021-11-06 HISTORY — PX: ORIF PATELLA: SHX5033

## 2021-11-06 SURGERY — OPEN REDUCTION INTERNAL FIXATION (ORIF) PATELLA
Anesthesia: General | Site: Knee | Laterality: Left

## 2021-11-06 MED ORDER — ACETAMINOPHEN 500 MG PO TABS: 1000.0000 mg | ORAL_TABLET | Freq: Three times a day (TID) | ORAL | 0 refills | Status: AC

## 2021-11-06 MED ORDER — EPHEDRINE SULFATE (PRESSORS) 50 MG/ML IJ SOLN
INTRAMUSCULAR | Status: DC | PRN
  Administered 2021-11-06 (×3): 5 mg via INTRAVENOUS
  Administered 2021-11-06: 10 mg via INTRAVENOUS

## 2021-11-06 MED ORDER — OXYCODONE HCL 5 MG PO TABS: 5.0000 mg | ORAL_TABLET | Freq: Four times a day (QID) | ORAL | 0 refills | Status: AC | PRN

## 2021-11-06 MED ORDER — BUPIVACAINE-EPINEPHRINE (PF) 0.5% -1:200000 IJ SOLN
INTRAMUSCULAR | Status: AC
  Filled 2021-11-06: qty 30

## 2021-11-06 MED ORDER — CELECOXIB 100 MG PO CAPS: 100.0000 mg | ORAL_CAPSULE | Freq: Every day | ORAL | 0 refills | Status: AC

## 2021-11-06 MED ORDER — ONDANSETRON HCL 4 MG/2ML IJ SOLN
INTRAMUSCULAR | Status: DC | PRN
  Administered 2021-11-06: 4 mg via INTRAVENOUS

## 2021-11-06 MED ORDER — BUPIVACAINE LIPOSOME 1.3 % IJ SUSP
INTRAMUSCULAR | Status: AC
  Filled 2021-11-06: qty 20

## 2021-11-06 MED ORDER — FENTANYL CITRATE (PF) 250 MCG/5ML IJ SOLN
INTRAMUSCULAR | Status: AC
  Filled 2021-11-06: qty 5

## 2021-11-06 MED ORDER — PHENYLEPHRINE 40 MCG/ML (10ML) SYRINGE FOR IV PUSH (FOR BLOOD PRESSURE SUPPORT)
PREFILLED_SYRINGE | INTRAVENOUS | Status: AC
Start: 2021-11-06 — End: ?
  Filled 2021-11-06: qty 10

## 2021-11-06 MED ORDER — CEFAZOLIN SODIUM-DEXTROSE 2-4 GM/100ML-% IV SOLN
2.0000 g | INTRAVENOUS | Status: AC
  Administered 2021-11-06: 2 g via INTRAVENOUS
  Filled 2021-11-06: qty 100

## 2021-11-06 MED ORDER — SODIUM CHLORIDE 0.9 % IR SOLN
Status: DC | PRN
  Administered 2021-11-06: 1000 mL

## 2021-11-06 MED ORDER — CHLORHEXIDINE GLUCONATE 0.12 % MT SOLN
15.0000 mL | Freq: Once | OROMUCOSAL | Status: AC
  Administered 2021-11-06: 15 mL via OROMUCOSAL

## 2021-11-06 MED ORDER — BUPIVACAINE-EPINEPHRINE (PF) 0.5% -1:200000 IJ SOLN
INTRAMUSCULAR | Status: DC | PRN
  Administered 2021-11-06: 30 mL

## 2021-11-06 MED ORDER — PHENYLEPHRINE HCL (PRESSORS) 10 MG/ML IV SOLN
INTRAVENOUS | Status: DC | PRN
  Administered 2021-11-06 (×2): 40 ug via INTRAVENOUS

## 2021-11-06 MED ORDER — FENTANYL CITRATE (PF) 100 MCG/2ML IJ SOLN
INTRAMUSCULAR | Status: DC | PRN
Start: 2021-11-06 — End: 2021-11-06
  Administered 2021-11-06 (×3): 25 ug via INTRAVENOUS
  Administered 2021-11-06 (×2): 50 ug via INTRAVENOUS
  Administered 2021-11-06: 25 ug via INTRAVENOUS

## 2021-11-06 MED ORDER — ASPIRIN EC 81 MG PO TBEC: 81.0000 mg | DELAYED_RELEASE_TABLET | Freq: Two times a day (BID) | ORAL | 0 refills | Status: AC

## 2021-11-06 MED ORDER — ORAL CARE MOUTH RINSE: 15.0000 mL | Freq: Once | OROMUCOSAL | Status: AC

## 2021-11-06 MED ORDER — LIDOCAINE HCL (CARDIAC) PF 50 MG/5ML IV SOSY
PREFILLED_SYRINGE | INTRAVENOUS | Status: DC | PRN
  Administered 2021-11-06: 50 mg via INTRAVENOUS

## 2021-11-06 MED ORDER — LACTATED RINGERS IV SOLN: INTRAVENOUS | Status: DC

## 2021-11-06 MED ORDER — EPHEDRINE 5 MG/ML INJ
INTRAVENOUS | Status: AC
  Filled 2021-11-06: qty 5

## 2021-11-06 MED ORDER — ONDANSETRON HCL 4 MG/2ML IJ SOLN
INTRAMUSCULAR | Status: AC
  Filled 2021-11-06: qty 2

## 2021-11-06 MED ORDER — ONDANSETRON HCL 4 MG PO TABS: 4.0000 mg | ORAL_TABLET | Freq: Three times a day (TID) | ORAL | 0 refills | Status: AC | PRN

## 2021-11-06 MED ORDER — ONDANSETRON HCL 4 MG/2ML IJ SOLN: 4.0000 mg | Freq: Once | INTRAMUSCULAR | Status: DC | PRN

## 2021-11-06 MED ORDER — FENTANYL CITRATE PF 50 MCG/ML IJ SOSY: 25.0000 ug | PREFILLED_SYRINGE | INTRAMUSCULAR | Status: DC | PRN

## 2021-11-06 MED ORDER — PROPOFOL 10 MG/ML IV BOLUS
INTRAVENOUS | Status: DC | PRN
  Administered 2021-11-06: 130 mg via INTRAVENOUS

## 2021-11-06 SURGICAL SUPPLY — 73 items
APL PRP STRL LF DISP 70% ISPRP (MISCELLANEOUS) ×1
BANDAGE ELASTIC 4 VELCRO NS (GAUZE/BANDAGES/DRESSINGS) ×2 IMPLANT
BANDAGE ESMARK 6X9 LF (GAUZE/BANDAGES/DRESSINGS) ×1 IMPLANT
BIT DRILL 2.6 CANN (BIT) ×1 IMPLANT
BIT DRILL 2.8X128 (BIT) IMPLANT
BIT DRILL CANN F/COMP 2.2 (BIT) ×1 IMPLANT
BLADE SURG SZ10 CARB STEEL (BLADE) ×2 IMPLANT
BNDG CMPR 9X6 STRL LF SNTH (GAUZE/BANDAGES/DRESSINGS) ×1
BNDG CMPR STD VLCR NS LF 5.8X4 (GAUZE/BANDAGES/DRESSINGS) ×2
BNDG CMPR STD VLCR NS LF 5.8X6 (GAUZE/BANDAGES/DRESSINGS) ×1
BNDG COHESIVE 4X5 TAN ST LF (GAUZE/BANDAGES/DRESSINGS) ×1 IMPLANT
BNDG COHESIVE 4X5 TAN STRL (GAUZE/BANDAGES/DRESSINGS) ×2 IMPLANT
BNDG ELASTIC 4X5.8 VLCR NS LF (GAUZE/BANDAGES/DRESSINGS) ×4 IMPLANT
BNDG ELASTIC 6X5.8 VLCR NS LF (GAUZE/BANDAGES/DRESSINGS) ×2 IMPLANT
BNDG ESMARK 6X9 LF (GAUZE/BANDAGES/DRESSINGS) ×2
BRACE T-SCOPE KNEE POSTOP (MISCELLANEOUS) ×1 IMPLANT
CHLORAPREP W/TINT 26 (MISCELLANEOUS) ×2 IMPLANT
CLOTH BEACON ORANGE TIMEOUT ST (SAFETY) ×2 IMPLANT
COVER LIGHT HANDLE STERIS (MISCELLANEOUS) ×4 IMPLANT
CUFF TOURN SGL QUICK 34 (TOURNIQUET CUFF)
CUFF TRNQT CYL 34X4.125X (TOURNIQUET CUFF) IMPLANT
DECANTER SPIKE VIAL GLASS SM (MISCELLANEOUS) ×2 IMPLANT
DRAPE C-ARM FOLDED MOBILE STRL (DRAPES) ×2 IMPLANT
DRAPE HALF SHEET 40X57 (DRAPES) ×4 IMPLANT
DRAPE INCISE IOBAN 66X45 STRL (DRAPES) ×2 IMPLANT
ELECT REM PT RETURN 9FT ADLT (ELECTROSURGICAL) ×2
ELECTRODE REM PT RTRN 9FT ADLT (ELECTROSURGICAL) ×1 IMPLANT
GAUZE SPONGE 4X4 12PLY STRL (GAUZE/BANDAGES/DRESSINGS) ×1 IMPLANT
GAUZE XEROFORM 1X8 LF (GAUZE/BANDAGES/DRESSINGS) ×1 IMPLANT
GLOVE SRG 8 PF TXTR STRL LF DI (GLOVE) ×1 IMPLANT
GLOVE SURG POLYISO LF SZ8 (GLOVE) ×6 IMPLANT
GLOVE SURG UNDER POLY LF SZ7 (GLOVE) ×6 IMPLANT
GLOVE SURG UNDER POLY LF SZ8 (GLOVE) ×2
GOWN STRL REUS W/ TWL XL LVL3 (GOWN DISPOSABLE) ×1 IMPLANT
GOWN STRL REUS W/TWL LRG LVL3 (GOWN DISPOSABLE) ×4 IMPLANT
GOWN STRL REUS W/TWL XL LVL3 (GOWN DISPOSABLE) ×2
GUIDEWIRE 1.35MM  DUAL TROCAR (WIRE) ×2
GUIDEWIRE 1.35MM DUAL TROCAR (WIRE) IMPLANT
IMMOBILIZER KNEE 19 UNV (ORTHOPEDIC SUPPLIES) ×2 IMPLANT
INST SET MINOR BONE (KITS) ×2 IMPLANT
K-WIRE BB-TAK (WIRE) ×4
KIT TURNOVER KIT A (KITS) ×2 IMPLANT
KWIRE BB-TAK (WIRE) IMPLANT
MANIFOLD NEPTUNE II (INSTRUMENTS) ×2 IMPLANT
NDL HYPO 18GX1.5 BLUNT FILL (NEEDLE) ×1 IMPLANT
NDL HYPO 21X1.5 SAFETY (NEEDLE) ×1 IMPLANT
NDL MA TROC 1/2 (NEEDLE) IMPLANT
NEEDLE HYPO 18GX1.5 BLUNT FILL (NEEDLE) ×2 IMPLANT
NEEDLE HYPO 21X1.5 SAFETY (NEEDLE) ×2 IMPLANT
NEEDLE MA TROC 1/2 (NEEDLE) ×2 IMPLANT
NS IRRIG 1000ML POUR BTL (IV SOLUTION) ×2 IMPLANT
PACK BASIC LIMB (CUSTOM PROCEDURE TRAY) ×2 IMPLANT
PAD ARMBOARD 7.5X6 YLW CONV (MISCELLANEOUS) ×2 IMPLANT
PADDING WEBRIL 4 STERILE (GAUZE/BANDAGES/DRESSINGS) ×1 IMPLANT
PLATE PATELLA ARROW 3 (Plate) ×1 IMPLANT
SCREW CANN THRD 4X30 (Screw) ×1 IMPLANT
SCREW LOCK 18X3XVALOPRFL (Screw) IMPLANT
SCREW LOCK TI QF 3X14 (Screw) ×1 IMPLANT
SCREW LOCKING 3.0X18 (Screw) ×6 IMPLANT
SCREW LOCKING VARIABLE 3.0X16 (Screw) ×3 IMPLANT
SET BASIN LINEN APH (SET/KITS/TRAYS/PACK) ×2 IMPLANT
SPONGE T-LAP 18X18 ~~LOC~~+RFID (SPONGE) ×3 IMPLANT
STRIP CLOSURE SKIN 1/2X4 (GAUZE/BANDAGES/DRESSINGS) ×1 IMPLANT
SUT BRALON NAB BRD #1 30IN (SUTURE) IMPLANT
SUT MON AB 0 CT1 (SUTURE) ×2 IMPLANT
SUT MON AB 2-0 CT1 36 (SUTURE) ×1 IMPLANT
SUT MON AB 4-0 SH 27 (SUTURE) ×1 IMPLANT
SUT TIGER TAPE 7 IN WHITE (SUTURE) ×1 IMPLANT
SUT VIC AB 1 CT1 27 (SUTURE) ×2
SUT VIC AB 1 CT1 27XBRD ANTBC (SUTURE) ×1 IMPLANT
SYR 20ML LL LF (SYRINGE) ×4 IMPLANT
SYR BULB IRRIG 60ML STRL (SYRINGE) ×2 IMPLANT
TOWEL OR 17X26 4PK STRL BLUE (TOWEL DISPOSABLE) ×1 IMPLANT

## 2021-11-06 NOTE — Op Note (Signed)
Orthopaedic Surgery Operative Note (CSN: [id number]) ? ?Brittany Holden [facility]  04-11-1930 ?Date of Surgery: 11/06/2021 ? ? ?Diagnoses:  ?Left patella fracture. ? ?Procedure: ?Operative fixation of comminuted left patella fracture ?  ?Operative Finding ?Successful completion of the planned procedure.  Operative fixation with a low-profile patellar fracture plate.  Additional fixation with a single cannulated screw, as well as a high-strength circumferential suture as backup.  There was no gapping to 90 degrees of flexion. ? ? ?Post-Op Diagnosis: Same ?Surgeons:Primary: Mordecai Rasmussen, MD ?Assistants: Marquita Palms ?Location: [facility] OR ROOM 3 ?Anesthesia: General with local anesthesia ?Antibiotics: Ancef 2 g ?Tourniquet time:  ?Total Tourniquet Time Documented: ?Thigh (Left) - 70 minutes ?Total: Thigh (Left) - 70 minutes ? ?Estimated Blood Loss: 150 cc ?Complications: None ?Specimens: None ?Implants: ?Implant Name Type Inv. Item Serial No. Manufacturer Lot No. LRB No. Used Action  ?PLATE PATELLA ARROW 3 - SNA Plate PLATE PATELLA ARROW 3 NA [employer] [id number] Left 1 Implanted  ?SCREW CANN THRD 4X30 - SSTERILE ON SET  Screw SCREW CANN THRD 4X30 STERILE ON SET  [employer]  Left 1 Implanted  ?SCREW LOCKING VARIABLE 3.0X16 - SSTERILE ON SET  Screw SCREW LOCKING VARIABLE 3.0X16 STERILE ON SET  [employer]  Left 3 Implanted  ?SCREW LOCK TI QF 3X14 - SSTERILE ON SET  Screw SCREW LOCK TI QF 3X14 STERILE ON SET  [employer]  Left 1 Implanted  ?SCREW LOCKING 3.0X18 - SSTERILE ON SET  Screw SCREW LOCKING 3.0X18 STERILE ON SET  [employer]  Left 3 Implanted  ? ? ?Indications for Surgery:   ?Brittany Holden is a 88 y.o. female who fell, directly onto her left knee, and sustained a left comminuted, stellate fracture of the patella.  She is a very active, and healthy individual.  In clinic, she is unable to complete a straight leg raise.  As result, I recommended operative fixation in order to restore her form and function..  Benefits and risks  of operative and nonoperative management were discussed prior to surgery with the patient and informed consent form was completed.  Specific risks including infection, need for additional surgery, bleeding, persistent pain, prominent hardware, malunion, nonunion, damage to surrounding structures and more severe complications associated with anesthesia.  She elected proceed.  Surgical consent was finalized. ? ? ?Procedure:   ?The patient was identified properly. Informed consent was obtained and the surgical site was marked. The patient was taken up to suite where general anesthesia was induced.  The patient was positioned supine, with a bump, and her leg on bone foam.  The left leg was prepped and draped in the usual sterile fashion.  Timeout was performed before the beginning of the case. ? ?Tourniquet was used for the above duration.  She received 2 g of Ancef prior to making incision. ? ?We made a longitudinal incision, centered over the patella.  This was extended proximally and distally.  We incised sharply through the skin, subcutaneous tissue.  We then dissected sharply down to the superior surface of the patella.  From there, we continued our surgical layer proximally and distally with a combination of electrocautery and scissors.  Once we got to the patella, there were multiple fracture lines.  There is a complete, transverse fracture line, with a vertical fracture line extending proximally.  In addition, there is a vertical fracture extending distally.  Fracture hematoma was evacuated.  The fractures were then carefully distracted, and hematoma was debrided with a combination of irrigation  and a freer elevator.  With the assistance of fluoroscopy, we then used 2 large reduction clamps to compress across the fractures.  We placed a fracture from medial to lateral for the proximal fragment.  We then placed a large reduction clamp spanning the entire length from the proximal pole to the distal pole.   Orthogonal views on x-rays demonstrated an excellent reduction.  We then selected a plate, with an arrow configuration.  The template was placed on the patella.  Positioning was confirmed under fluoroscopy.  We then proceeded to place multiple screws in all fracture fragments.  All screws were locking screws.  At this point, the vertical fracture line in the proximal fracture fragment was noted to be a little bit lateral.  As such, there was room for an additional screw that was placed from distal to proximal, within the medial one third of the patella.  This provided additional compression.  We then took a high-strength suture, and passed this circumferentially within the soft tissue surrounding the patella.  This was tied securely at the distal pole.  The knee was then flexed to 90 degrees.  There is no gapping of the fractures.  We are very pleased with both the reduction, as well as the overall fixation.  Final orthogonal views under x-ray confirmed excellent reduction, with appropriate placement of all hardware.  The knee was irrigated copiously with normal saline.  We used 2-0 Vicryl to close down the small incisions made within the patellar tendon. ? ?Next, we closed the incision in a multilayer fashion with absorbable suture.  A sterile dressing was placed, followed by a hinged knee brace, locked in extension.   The patient was awoken and taken to PACU in stable condition. ? ?Post-operative plan:  ?The patient will be nonweightbearing on the operative extremity, with the brace locked in extension.   ?DVT prophylaxis Aspirin 81 mg twice daily for 6 weeks.    ?Pain control with PRN pain medication preferring oral medicines.   ?Follow up plan will be scheduled in approximately 10-14 days for incision check and XR. ? ?

## 2021-11-06 NOTE — Interval H&P Note (Signed)
History and Physical Interval Note: ? ?11/06/2021 ?12:57 PM ? ?Brittany Holden  has presented today for surgery, with the diagnosis of Left patella fracture.  The various methods of treatment have been discussed with the patient and family. After consideration of risks, benefits and other options for treatment, the patient has consented to  Procedure(s): ?OPEN REDUCTION INTERNAL (ORIF) FIXATION PATELLA (Left) as a surgical intervention.  The patient's history has been reviewed, patient examined, no change in status, stable for surgery.  I have reviewed the patient's chart and labs.  Questions were answered to the patient's satisfaction.   ? ? ?Mordecai Rasmussen ? ? ?

## 2021-11-06 NOTE — Anesthesia Preprocedure Evaluation (Signed)
Anesthesia Evaluation  ?Patient identified by MRN, date of birth, ID band ?Patient awake ? ? ? ?Reviewed: ?Allergy & Precautions, H&P , NPO status , Patient's Chart, lab work & pertinent test results, reviewed documented beta blocker date and time  ? ?Airway ?Mallampati: II ? ?TM Distance: >3 FB ?Neck ROM: full ? ? ? Dental ?no notable dental hx. ? ?  ?Pulmonary ?neg pulmonary ROS,  ?  ?Pulmonary exam normal ?breath sounds clear to auscultation ? ? ? ? ? ? Cardiovascular ?Exercise Tolerance: Good ?hypertension, negative cardio ROS ? ? ?Rhythm:regular Rate:Normal ? ? ?  ?Neuro/Psych ?negative neurological ROS ? negative psych ROS  ? GI/Hepatic ?negative GI ROS, Neg liver ROS,   ?Endo/Other  ?Hypothyroidism  ? Renal/GU ?CRFRenal disease  ?negative genitourinary ?  ?Musculoskeletal ? ? Abdominal ?  ?Peds ? Hematology ?negative hematology ROS ?(+)   ?Anesthesia Other Findings ? ? Reproductive/Obstetrics ?negative OB ROS ? ?  ? ? ? ? ? ? ? ? ? ? ? ? ? ?  ?  ? ? ? ? ? ? ? ? ?Anesthesia Physical ?Anesthesia Plan ? ?ASA: 2 ? ?Anesthesia Plan: General and General LMA  ? ?Post-op Pain Management:   ? ?Induction:  ? ?PONV Risk Score and Plan: Ondansetron ? ?Airway Management Planned:  ? ?Additional Equipment:  ? ?Intra-op Plan:  ? ?Post-operative Plan:  ? ?Informed Consent: I have reviewed the patients History and Physical, chart, labs and discussed the procedure including the risks, benefits and alternatives for the proposed anesthesia with the patient or authorized representative who has indicated his/her understanding and acceptance.  ? ? ? ?Dental Advisory Given ? ?Plan Discussed with: CRNA ? ?Anesthesia Plan Comments:   ? ? ? ? ? ? ?Anesthesia Quick Evaluation ? ?

## 2021-11-06 NOTE — Transfer of Care (Signed)
Immediate Anesthesia Transfer of Care Note ? ?Patient: Brittany Holden ? ?Procedure(s) Performed: OPEN REDUCTION INTERNAL (ORIF) FIXATION PATELLA (Left: Knee) ? ?Patient Location: PACU ? ?Anesthesia Type:General ? ?Level of Consciousness: awake and patient cooperative ? ?Airway & Oxygen Therapy: Patient Spontanous Breathing and non-rebreather face mask ? ?Post-op Assessment: Report given to RN and Post -op Vital signs reviewed and stable ? ?Post vital signs: Reviewed and stable ? ?Last Vitals:  ?Vitals Value Taken Time  ?BP 160/89 11/06/21 1508  ?Temp 36.8 ?C 11/06/21 1508  ?Pulse 101 11/06/21 1510  ?Resp 16 11/06/21 1510  ?SpO2 100 % 11/06/21 1510  ?Vitals shown include unvalidated device data. ? ?Last Pain:  ?Vitals:  ? 11/06/21 1508  ?PainSc: 0-No pain  ?   ? ?  ? ?Complications: No notable events documented. ?

## 2021-11-06 NOTE — Anesthesia Procedure Notes (Signed)
Procedure Name: LMA Insertion ?Date/Time: 11/06/2021 1:08 PM ?Performed by: Vista Deck, CRNA ?Pre-anesthesia Checklist: Patient identified, Patient being monitored, Emergency Drugs available, Timeout performed and Suction available ?Patient Re-evaluated:Patient Re-evaluated prior to induction ?Oxygen Delivery Method: Circle System Utilized ?Preoxygenation: Pre-oxygenation with 100% oxygen ?Induction Type: IV induction ?Ventilation: Mask ventilation without difficulty ?LMA: LMA inserted ?LMA Size: 4.0 ?Number of attempts: 1 ?Placement Confirmation: positive ETCO2 and breath sounds checked- equal and bilateral ?Tube secured with: Tape ?Dental Injury: Teeth and Oropharynx as per pre-operative assessment  ? ? ? ? ?

## 2021-11-06 NOTE — Discharge Instructions (Signed)
?Tylerjames Hoglund A. Amedeo Kinsman, MD MS ?[facility] ?[address] ?[place],  [place]  [address] ?Phone: [phone] ?Fax: [phone] ? ? ?POST-OPERATIVE INSTRUCTIONS - LOWER EXTREMITY ? ? ?WOUND CARE ?Please keep dressing clean, dry and intact until followup.  ?Keep your leg straight at all times until your follow up appointment.  ?You may shower on Post-Op Day #3.  ?You must keep the dressing dry during this process and may find that a plastic bag taped around the leg or alternatively a towel based bath may be a better option.   ?If you get your dressing wet, please contact our clinic. ? ?EXERCISES ?Due to your injury, you will not be able to bear weight through your extremity.   ?DO NOT PUT ANY WEIGHT ON YOUR OPERATIVE LEG ?Please use crutches or a walker to avoid weight bearing.  ?Keep the brace on at all times.  Home health PT will discuss this with you and provide updates.  ? ?REGIONAL ANESTHESIA (NERVE BLOCKS) ?The anesthesia team may have performed a nerve block for you if safe in the setting of your care.  This is a great tool used to minimize pain.  Typically the block may start wearing off overnight but the long acting medicine may last for 3-4 days.  The nerve block wearing off can be a challenging period but please utilize your as needed pain medications to try and manage this period.   ? ?POST-OP MEDICATIONS- Multimodal approach to pain control ? In general your pain will be controlled with a combination of substances.  Prescriptions unless otherwise discussed are electronically sent to your pharmacy.  This is a carefully made plan we use to minimize narcotic use.    ? - Celebrex - Anti-inflammatory medication taken on a scheduled basis ? - Acetaminophen - Non-narcotic pain medicine taken on a scheduled basis  ? - Oxycodone - This is a strong narcotic, to be used only on an "as needed" basis for pain. ? -  Aspirin '81mg'$  - This medicine is used to minimize the risk of blood clots after  surgery. ?            -          Zofran - take as needed for nausea  ? ?FOLLOW-UP ?If you develop a Fever (>101.5), Redness or Drainage from the surgical incision site, please call our office to arrange for an evaluation. ?Please call the office to schedule a follow-up appointment for your incision check if you do not already have one, 10-14 days post-operatively. ? ?IF YOU HAVE ANY QUESTIONS, PLEASE FEEL FREE TO CALL OUR OFFICE. ? ?HELPFUL INFORMATION ? ?If you had a block, it will wear off between 8-24 hrs postop typically.  This is period when your pain may go from nearly zero to the pain you would have had postop without the block.  This is an abrupt transition but nothing dangerous is happening.  You may take an extra dose of narcotic when this happens. ? ?You should wean off your narcotic medicines as soon as you are able.  Most patients will be off or using minimal narcotics before their first postop appointment.  ? ?Elevating your leg will help with swelling and pain control.  You are encouraged to elevate your leg as much as possible in the first couple of weeks following surgery.  Imagine a drop of water on your toe, and your goal is to get that water back to your heart. ? ?We suggest  you use the pain medication the first night prior to going to bed, in order to ease any pain when the anesthesia wears off. You should avoid taking pain medications on an empty stomach as it will make you nauseous. ? ?Do not drink alcoholic beverages or take illicit drugs when taking pain medications. ? ?In most states it is against the law to drive while you are in a splint or sling.  And certainly against the law to drive while taking narcotics. ? ?You may return to work/school in the next couple of days when you feel up to it.  ? ?Pain medication may make you constipated.  Below are a few solutions to try in this order: ?Decrease the amount of pain medication if you aren?t having pain. ?Drink lots of decaffeinated  fluids. ?Drink prune juice and/or each dried prunes ? ?If the first 3 don?t work start with additional solutions ?Take Colace - an over-the-counter stool softener ?Take Senokot - an over-the-counter laxative ?Take Miralax - a stronger over-the-counter laxative f ?

## 2021-11-07 NOTE — Anesthesia Postprocedure Evaluation (Signed)
Anesthesia Post Note ? ?Patient: Brittany Holden ? ?Procedure(s) Performed: OPEN REDUCTION INTERNAL (ORIF) FIXATION PATELLA (Left: Knee) ? ?Patient location during evaluation: Phase II ?Anesthesia Type: General ?Level of consciousness: awake ?Pain management: pain level controlled ?Vital Signs Assessment: post-procedure vital signs reviewed and stable ?Respiratory status: spontaneous breathing and respiratory function stable ?Cardiovascular status: blood pressure returned to baseline and stable ?Postop Assessment: no headache and no apparent nausea or vomiting ?Anesthetic complications: no ?Comments: Late entry ? ? ?No notable events documented. ? ? ?Last Vitals:  ?Vitals:  ? 11/06/21 1530 11/06/21 1538  ?BP: (!) 168/107 (!) 155/93  ?Pulse: (!) 105 94  ?Resp: (!) 26 (!) 23  ?Temp:  36.8 ?C  ?SpO2: 93% 96%  ?  ?Last Pain:  ?Vitals:  ? 11/06/21 1538  ?TempSrc: Oral  ?PainSc:   ? ? ?  ?  ?  ?  ?  ?  ? ?Louann Sjogren ? ? ? ? ?

## 2021-11-09 ENCOUNTER — Encounter (HOSPITAL_COMMUNITY): Payer: Self-pay | Admitting: Orthopedic Surgery

## 2021-11-14 ENCOUNTER — Ambulatory Visit: Payer: Medicare HMO

## 2021-11-14 ENCOUNTER — Encounter: Payer: Self-pay | Admitting: Orthopedic Surgery

## 2021-11-14 ENCOUNTER — Ambulatory Visit (INDEPENDENT_AMBULATORY_CARE_PROVIDER_SITE_OTHER): Payer: Medicare HMO | Admitting: Orthopedic Surgery

## 2021-11-14 ENCOUNTER — Other Ambulatory Visit: Payer: Self-pay

## 2021-11-14 DIAGNOSIS — S82002D Unspecified fracture of left patella, subsequent encounter for closed fracture with routine healing: Secondary | ICD-10-CM | POA: Diagnosis not present

## 2021-11-14 DIAGNOSIS — S82002A Unspecified fracture of left patella, initial encounter for closed fracture: Secondary | ICD-10-CM

## 2021-11-14 NOTE — Progress Notes (Signed)
Orthopaedic Postop Note ? ?Assessment: ?Brittany Holden is a 88 y.o. female s/p ORIF of left patella fracture  ? ?DOS: 11/06/21 ? ?Plan: ?Sutures trimmed, steri strips placed ?Radiographs stable ?Her knee looks good. ?She is able to achieve full extension ?Continue per the protocol ?Cautious increase in weightbearing.  Brace locked in extension with ambulation; ?Using a walker.  ?Follow-up in 4 weeks ? ? ?Follow-up: ?Return in about 4 weeks (around 12/12/2021). ?XR at next visit: Left knee ? ?Subjective: ? ?Chief Complaint  ?Patient presents with  ? Knee Pain  ?  ORIF left comminuted patella fracture, DOS 11/06/21, doing well.  Dressing off sutures trimmed.  Reapplied steri strips.    ? ? ?History of Present Illness: ?Brittany Holden is a 88 y.o. female who presents following the above stated procedure.  Surgery was approximately 8 days ago.  She has done well.  She is working with home health physical therapy.  Per reports, she has bearing more weight than she should at this point.  Her pain has been controlled.  She is not taking medications on a regular basis.  She does not like the brace. ? ?Review of Systems: ?No fevers or chills ?No numbness or tingling ?No Chest Pain ?No shortness of breath ? ? ?Objective: ?There were no vitals taken for this visit. ? ?Physical Exam: ? ?Surgical incision is healing well.  No surrounding erythema or drainage.  She has some improving bruising about the knee.  Residual swelling around the knee, as well as the ankle.  No tenderness around the ankle.  Passively, I can get her to full extension.  She is comfortably sitting at approximately 45 degrees of flexion.  Toes are warm and well-perfused. ? ?IMAGING: ?I personally ordered and reviewed the following images: ? ?X-ray of the left knee was obtained in clinic today.  There has been no interval displacement at the fracture site.  Hardware remains intact.  No evidence of screws backing out.  No callus formation is  appreciated. ? ?Impression: Healing left patella fracture, without hardware failure. ? ? ?Mordecai Rasmussen, MD ?11/14/2021 ?11:09 AM ? ? ?

## 2021-11-14 NOTE — Patient Instructions (Signed)
Continue to progress with the protocol ? ?Limited weight bearing.  Use a walker at all times.  Brace to be locked in extension when up moving around at all times.  ? ?F/u 4 weeks ?

## 2021-11-17 DIAGNOSIS — N1831 Chronic kidney disease, stage 3a: Secondary | ICD-10-CM | POA: Diagnosis not present

## 2021-11-22 DIAGNOSIS — N281 Cyst of kidney, acquired: Secondary | ICD-10-CM | POA: Diagnosis not present

## 2021-11-22 DIAGNOSIS — I5032 Chronic diastolic (congestive) heart failure: Secondary | ICD-10-CM | POA: Diagnosis not present

## 2021-11-22 DIAGNOSIS — I129 Hypertensive chronic kidney disease with stage 1 through stage 4 chronic kidney disease, or unspecified chronic kidney disease: Secondary | ICD-10-CM | POA: Diagnosis not present

## 2021-11-22 DIAGNOSIS — E875 Hyperkalemia: Secondary | ICD-10-CM | POA: Diagnosis not present

## 2021-11-22 DIAGNOSIS — N1832 Chronic kidney disease, stage 3b: Secondary | ICD-10-CM | POA: Diagnosis not present

## 2021-12-05 ENCOUNTER — Ambulatory Visit (INDEPENDENT_AMBULATORY_CARE_PROVIDER_SITE_OTHER): Payer: Medicare HMO | Admitting: Orthopedic Surgery

## 2021-12-05 ENCOUNTER — Ambulatory Visit: Payer: Medicare HMO

## 2021-12-05 ENCOUNTER — Encounter: Payer: Self-pay | Admitting: Orthopedic Surgery

## 2021-12-05 VITALS — Ht 72.0 in | Wt 121.0 lb

## 2021-12-05 DIAGNOSIS — N1831 Chronic kidney disease, stage 3a: Secondary | ICD-10-CM | POA: Diagnosis not present

## 2021-12-05 DIAGNOSIS — S82002D Unspecified fracture of left patella, subsequent encounter for closed fracture with routine healing: Secondary | ICD-10-CM

## 2021-12-05 NOTE — Progress Notes (Signed)
Orthopaedic Postop Note ? ?Assessment: ?Brittany Holden is a 88 y.o. female s/p ORIF of left patella fracture  ? ?DOS: 11/06/21 ? ?Plan: ?Brittany Holden is doing very well following surgery.  She has no pain in her left knee.  She can easily get to 90 degrees of flexion.  She has some atrophy of the quadriceps, which is making the brace difficult to fit.  Nonetheless, I would like her to continue using the brace while ambulating for an additional 2 weeks.  This was discussed with the patient and her son.  She would like to transition to outpatient PT.  A referral was placed today. ? ? ?Follow-up in 4 weeks ? ? ?Follow-up: ?Return in about 4 weeks (around 01/02/2022). ?XR at next visit: Left knee ? ?Subjective: ? ?Chief Complaint  ?Patient presents with  ? Routine Post Op  ?  Lt patella fx DOS 11/06/21  ? ? ?History of Present Illness: ?Brittany Holden is a 88 y.o. female who presents following the above stated procedure.  The procedure was approximately 1 month ago.  She is doing very well.  She is not taking any pain medications.  She has no pain in her left knee.  She has been working with home health physical therapy.  She easily gets to 90 degrees of flexion.  She has continued to use the brace while walking, and is currently using a cane.  She would like to start outpatient physical therapy. ? ?Review of Systems: ?No fevers or chills ?No numbness or tingling ?No Chest Pain ?No shortness of breath ? ? ?Objective: ?Ht 6' (1.829 m)   Wt 121 lb (54.9 kg)   BMI 16.41 kg/m?  ? ?Physical Exam: ? ?Left knee with some mild effusion overall.  No tenderness to palpation.  Incision is healing well without surrounding erythema or drainage.  Passively, she can get to full extension.  She tolerates flexion to 90 degrees.  Active motion intact in the EHL/TA.  Toes are warm and well-perfused. ? ?IMAGING: ?I personally ordered and reviewed the following images: ? ?X-rays of the left knee were obtained in clinic today.  There has been no  interval displacement of the fracture.  Hardware remains intact, in good position.  No evidence of hardware failure or loosening.  The screws are not backing out.  Overall alignment remains unchanged. ? ?Impression: Healing left patella fracture following operative fixation, without hardware failure. ? ? ?Mordecai Rasmussen, MD ?12/05/2021 ?8:45 AM ? ? ?

## 2021-12-05 NOTE — Patient Instructions (Signed)
Continue with home health physical therapy, until outpatient physical therapy can be scheduled. ? ?Please continue to use the brace, when walking, locked in full extension for an additional 2 weeks.  At that point, the brace can be removed.  Please advance activity slowly.  Your kneecap is still healing. ? ?Okay to wash the incision.  Please do not submerge for another month.  No bathing, swimming, hot tub etc. ?

## 2021-12-18 ENCOUNTER — Ambulatory Visit (HOSPITAL_COMMUNITY): Payer: Medicare HMO

## 2021-12-26 ENCOUNTER — Ambulatory Visit (HOSPITAL_COMMUNITY): Payer: Medicare HMO

## 2021-12-27 ENCOUNTER — Ambulatory Visit (HOSPITAL_COMMUNITY): Payer: Medicare HMO | Admitting: Physical Therapy

## 2022-01-02 ENCOUNTER — Other Ambulatory Visit: Payer: Self-pay | Admitting: Orthopedic Surgery

## 2022-01-02 ENCOUNTER — Encounter: Payer: Self-pay | Admitting: Orthopedic Surgery

## 2022-01-02 ENCOUNTER — Ambulatory Visit: Payer: Medicare HMO

## 2022-01-02 ENCOUNTER — Ambulatory Visit (INDEPENDENT_AMBULATORY_CARE_PROVIDER_SITE_OTHER): Payer: Medicare HMO

## 2022-01-02 ENCOUNTER — Ambulatory Visit (INDEPENDENT_AMBULATORY_CARE_PROVIDER_SITE_OTHER): Payer: Medicare HMO | Admitting: Orthopedic Surgery

## 2022-01-02 DIAGNOSIS — S82002D Unspecified fracture of left patella, subsequent encounter for closed fracture with routine healing: Secondary | ICD-10-CM

## 2022-01-02 NOTE — Progress Notes (Signed)
Orthopaedic Postop Note ? ?Assessment: ?Brittany Holden is a 88 y.o. female s/p ORIF of left patella fracture  ? ?DOS: 11/06/21 ? ?Plan: ?Mrs. Cutbirth has recovered well.  She has no pain in her left knee.  She continues to use a cane, which I think is appropriate.  Advised her to continue to work on strengthening of the left quadriceps, as this will improve her function.  She does not feel as though she needs to follow-up again.  She will contact the clinic if she has any issues. ? ?Follow-up: ?Return if symptoms worsen or fail to improve. ?XR at next visit: Left knee ? ?Subjective: ? ?Chief Complaint  ?Patient presents with  ? Routine Post Op  ?  LT knee ?DOS 11/02/21  ? ? ?History of Present Illness: ?Brittany Holden is a 88 y.o. female who presents following the above stated procedure.  Surgery was approximately 2 months ago.  She has recovered well.  She is not having any pain in the left knee.  She is walking with a cane.  She is careful where she walks, and the surface on which she walks.  No issues with her incisions.  She denies pain. ? ?Review of Systems: ?No fevers or chills ?No numbness or tingling ?No Chest Pain ?No shortness of breath ? ? ?Objective: ?There were no vitals taken for this visit. ? ?Physical Exam: ? ?Atrophy of the left quadriceps.  Incision is healing well.  No surrounding erythema or drainage.  No tenderness to palpation about the incision or the patella.  She has full range of motion, including flexion beyond 110 degrees.  Hardware is not palpable. ? ?IMAGING: ?I personally ordered and reviewed the following images: ? ?X-rays of the left knee were obtained in clinic today.  These are compared to prior x-rays.  There is no evidence of hardware failure or loosening.  Good alignment of the comminuted left patella fracture.  Interval consolidation.  No acute injuries are noted. ? ?Impression: Healed left patella fracture and unchanged alignment. ? ? ?Mordecai Rasmussen, MD ?01/02/2022 ?8:55 AM ? ? ?

## 2022-01-08 DIAGNOSIS — R7301 Impaired fasting glucose: Secondary | ICD-10-CM | POA: Diagnosis not present

## 2022-01-08 DIAGNOSIS — E782 Mixed hyperlipidemia: Secondary | ICD-10-CM | POA: Diagnosis not present

## 2022-01-08 DIAGNOSIS — E039 Hypothyroidism, unspecified: Secondary | ICD-10-CM | POA: Diagnosis not present

## 2022-01-15 DIAGNOSIS — N1831 Chronic kidney disease, stage 3a: Secondary | ICD-10-CM | POA: Diagnosis not present

## 2022-01-15 DIAGNOSIS — E039 Hypothyroidism, unspecified: Secondary | ICD-10-CM | POA: Diagnosis not present

## 2022-01-15 DIAGNOSIS — E875 Hyperkalemia: Secondary | ICD-10-CM | POA: Diagnosis not present

## 2022-01-15 DIAGNOSIS — E782 Mixed hyperlipidemia: Secondary | ICD-10-CM | POA: Diagnosis not present

## 2022-01-15 DIAGNOSIS — R7301 Impaired fasting glucose: Secondary | ICD-10-CM | POA: Diagnosis not present

## 2022-01-15 DIAGNOSIS — R269 Unspecified abnormalities of gait and mobility: Secondary | ICD-10-CM | POA: Diagnosis not present

## 2022-01-15 DIAGNOSIS — S82002D Unspecified fracture of left patella, subsequent encounter for closed fracture with routine healing: Secondary | ICD-10-CM | POA: Diagnosis not present

## 2022-01-15 DIAGNOSIS — I10 Essential (primary) hypertension: Secondary | ICD-10-CM | POA: Diagnosis not present

## 2022-01-23 ENCOUNTER — Other Ambulatory Visit (HOSPITAL_COMMUNITY): Payer: Self-pay | Admitting: Nephrology

## 2022-01-23 ENCOUNTER — Other Ambulatory Visit: Payer: Self-pay | Admitting: Nephrology

## 2022-01-23 DIAGNOSIS — N281 Cyst of kidney, acquired: Secondary | ICD-10-CM

## 2022-01-25 DIAGNOSIS — I129 Hypertensive chronic kidney disease with stage 1 through stage 4 chronic kidney disease, or unspecified chronic kidney disease: Secondary | ICD-10-CM | POA: Diagnosis not present

## 2022-01-25 DIAGNOSIS — E875 Hyperkalemia: Secondary | ICD-10-CM | POA: Diagnosis not present

## 2022-01-25 DIAGNOSIS — N1832 Chronic kidney disease, stage 3b: Secondary | ICD-10-CM | POA: Diagnosis not present

## 2022-01-25 DIAGNOSIS — I5032 Chronic diastolic (congestive) heart failure: Secondary | ICD-10-CM | POA: Diagnosis not present

## 2022-01-25 DIAGNOSIS — N281 Cyst of kidney, acquired: Secondary | ICD-10-CM | POA: Diagnosis not present

## 2022-02-07 ENCOUNTER — Other Ambulatory Visit (HOSPITAL_COMMUNITY): Payer: Self-pay | Admitting: Nephrology

## 2022-02-07 ENCOUNTER — Ambulatory Visit (HOSPITAL_COMMUNITY)
Admission: RE | Admit: 2022-02-07 | Discharge: 2022-02-07 | Disposition: A | Discharge status: Home / Self Care | Payer: Medicare HMO | Source: Ambulatory Visit | Attending: Nephrology | Admitting: Nephrology

## 2022-02-07 DIAGNOSIS — N281 Cyst of kidney, acquired: Secondary | ICD-10-CM

## 2022-02-07 DIAGNOSIS — M47816 Spondylosis without myelopathy or radiculopathy, lumbar region: Secondary | ICD-10-CM | POA: Diagnosis not present

## 2022-02-07 DIAGNOSIS — M4186 Other forms of scoliosis, lumbar region: Secondary | ICD-10-CM | POA: Diagnosis not present

## 2022-02-07 DIAGNOSIS — N189 Chronic kidney disease, unspecified: Secondary | ICD-10-CM | POA: Diagnosis not present

## 2022-02-07 MED ORDER — GADOBUTROL 1 MMOL/ML IV SOLN: 5.0000 mL | Freq: Once | INTRAVENOUS | Status: DC | PRN

## 2022-02-13 ENCOUNTER — Encounter (HOSPITAL_COMMUNITY): Payer: Self-pay | Admitting: Physical Therapy

## 2022-02-13 ENCOUNTER — Ambulatory Visit (HOSPITAL_COMMUNITY): Payer: Medicare HMO | Attending: Internal Medicine | Admitting: Physical Therapy

## 2022-02-13 DIAGNOSIS — M6281 Muscle weakness (generalized): Secondary | ICD-10-CM

## 2022-02-13 DIAGNOSIS — R29898 Other symptoms and signs involving the musculoskeletal system: Secondary | ICD-10-CM

## 2022-02-13 DIAGNOSIS — R2689 Other abnormalities of gait and mobility: Secondary | ICD-10-CM | POA: Diagnosis not present

## 2022-02-13 DIAGNOSIS — H5200 Hypermetropia, unspecified eye: Secondary | ICD-10-CM | POA: Diagnosis not present

## 2022-02-13 DIAGNOSIS — Z0100 Encounter for examination of eyes and vision without abnormal findings: Secondary | ICD-10-CM | POA: Diagnosis not present

## 2022-02-13 NOTE — Therapy (Signed)
OUTPATIENT PHYSICAL THERAPY LOWER EXTREMITY EVALUATION   Patient Name: Brittany Holden MRN: [id number] DOB:05-29-1930, 88 y.o., female Today's Date: 02/13/2022   PT End of Session - 02/13/22 0831     Visit Number 1    Number of Visits 6    Date for PT Re-Evaluation 03/27/22    Authorization Type Primary [employer] (auth required) Secondary generic commercial    Authorization Time Period 6 visits requested, check auth    PT Start Time [phone]    PT Stop Time 0910    PT Time Calculation (min) 38 min    Equipment Utilized During Treatment Gait belt    Activity Tolerance Patient tolerated treatment well    Behavior During Therapy WFL for tasks assessed/performed             Past Medical History:  Diagnosis Date   Arrhythmia    Diverticulosis of colon (without mention of hemorrhage)    Dysrhythmia    Hypertension    Hypothyroidism    IBS (irritable bowel syndrome)    Neuropathy    Palpitations    Personal history of colonic polyps 1999 & 2007   hyperplastic    Past Surgical History:  Procedure Laterality Date   ABDOMINAL HYSTERECTOMY     FOOT ARTHRODESIS     pipj digits  2-4 left foot   ORIF PATELLA Left 11/06/2021   Procedure: OPEN REDUCTION INTERNAL (ORIF) FIXATION PATELLA;  Surgeon: Mordecai Rasmussen, MD;  Location: [facility];  Service: Orthopedics;  Laterality: Left;   TENDON REPAIR Right 05/18/2021   Procedure: RIGHT RING AND RIGHT SMALL TENDON TRANSFER VS EXTENSOR INDICIS PROPRIUS TO RING AND SMALL FINGER TRANSFER; DISTAL RADIOULNAR JOINT DEBRIDEMENT ULNA  DISTAL RESECTION WITH STABILIZATION;  Surgeon: [name], MD;  Location: [facility];  Service: Orthopedics;  Laterality: Right;   TONSILLECTOMY  06/26/2012   Procedure: TONSILLECTOMY;  Surgeon: Ascencion Dike, MD;  Location: [facility];  Service: ENT;  Laterality: Right;   VAGINAL HYSTERECTOMY     Patient Active Problem List   Diagnosis Date Noted   Chest pain with high risk for cardiac etiology 11/11/2014    Chest pain 11/11/2014   CKD (chronic kidney disease) stage 3, GFR 30-59 ml/min ([place]) 11/11/2014   Essential hypertension 11/11/2014   Hyperlipidemia 11/11/2014    PCP: Celene Squibb, MD   REFERRING PROVIDER: Celene Squibb, MD   REFERRING DIAG: R26.9  abnormalities of gait and mobility   THERAPY DIAG:  Other abnormalities of gait and mobility  Other symptoms and signs involving the musculoskeletal system  Muscle weakness (generalized)  Rationale for Evaluation and Treatment Rehabilitation  ONSET DATE:  s/p ORIF of left patella fracture DOS 11/06/21  SUBJECTIVE:   SUBJECTIVE STATEMENT: Patient states bilateral knee pain with getting up. She also is now having some balance trouble. Has been using her cane. Still very active and mows. She goes to [facility] and does a mix of cardio and strength.   PERTINENT HISTORY: fall in Feb 2023 fractured knee cap -needs balance training and strengthening, HTN  PAIN:  Are you having pain? Yes: NPRS scale: 5-6 with transfers/10 Pain location: bilateral knees Pain description: pain Aggravating factors: transfers Relieving factors: movement  PRECAUTIONS: Fall  WEIGHT BEARING RESTRICTIONS No  FALLS:  Has patient fallen in last 6 months? Yes. Number of falls 2  LIVING ENVIRONMENT: Lives with: lives alone Lives in: House/apartment Stairs: Yes: External: 2-3 steps; gate Has following equipment at home: Single point cane  OCCUPATION: Retired  PLOF: Independent  PATIENT GOALS get the pain out of her knees   OBJECTIVE:   COGNITION:  Overall cognitive status: Within functional limits for tasks assessed     SENSATION: WFL   POSTURE: rounded shoulders and forward head  PALPATION: No tenderness  LOWER EXTREMITY ROM: WFL for tasks assessed  Active ROM Right eval Left eval  Hip flexion    Hip extension    Hip abduction    Hip adduction    Hip internal rotation    Hip external rotation    Knee flexion    Knee extension     Ankle dorsiflexion    Ankle plantarflexion    Ankle inversion    Ankle eversion     (Blank rows = not tested)  LOWER EXTREMITY MMT:  MMT Right eval Left eval  Hip flexion 4+/5 4+/5  Hip extension    Hip abduction    Hip adduction    Hip internal rotation    Hip external rotation    Knee flexion 5/5 5/5  Knee extension 5/5 4+/5  Ankle dorsiflexion 5/5 5/5  Ankle plantarflexion    Ankle inversion    Ankle eversion     (Blank rows = not tested)   FUNCTIONAL TESTS:  2 minute walk test: 475 feet without AD Dynamic Gait Index: 21/14 Transfers, dynamic knee valgus bilaterally with c/o knee pain   DGI 1. Gait level surface (3) Normal: Walks 20', no assistive devices, good sped, no evidence for imbalance, normal gait pattern 2. Change in gait speed (3) Normal: Able to smoothly change walking speed without loss of balance or gait deviation. Shows a significant difference in walking speeds between normal, fast and slow speeds. 3. Gait with horizontal head turns (3) Normal: Performs head turns smoothly with no change in gait. 4. Gait with vertical head turns (3) Normal: Performs head turns smoothly with no change in gait. 5. Gait and pivot turn (2) Mild Impairment: Pivot turns safely in > 3 seconds and stops with no loss of balance. 6. Step over obstacle (2) Mild Impairment: Is able to step over box, but must slow down and adjust steps to clear box safely. 7. Step around obstacles (3) Normal: Is able to walk around cones safely without changing gait speed; no evidence of imbalance. 8. Stairs (2) Mild Impairment: Alternating feet, must use rail.  TOTAL SCORE: 21 / 24   GAIT: Distance walked: 475 feet Assistive device utilized: None Level of assistance: Complete Independence Comments: 2MWT    TODAY'S TREATMENT: 02/13/22 LAQ 10x 10 second holds bilateral  Mini squat at counter 2x 10  Standing hip abduction 2x 10 bilateral    PATIENT EDUCATION:  Education  details: Patient educated on exam findings, POC, scope of PT, HEP. Person educated: Patient Education method: Explanation, Demonstration, and Handouts Education comprehension: verbalized understanding, returned demonstration, verbal cues required, and tactile cues required   HOME EXERCISE PROGRAM: 6/13/23Access Code: [email or web address] - Seated Long Arc Quad  - 3 x daily - 7 x weekly - 10 reps - 10 second hold - Mini Squat with Counter Support  - 1 x daily - 7 x weekly - 2 sets - 10 reps - Standing Hip Abduction with Counter Support  - 3 x daily - 7 x weekly - 2 sets - 10 reps - 3 second hold  ASSESSMENT:  CLINICAL IMPRESSION: Patient a 88 y.o. y.o. female who was seen today for physical therapy evaluation and treatment for gait and balance deficit following  left patellar fracture in February 2023 with ORIF on 11/06/21. Patient presents with pain limited deficits in L knee strength, endurance, activity tolerance, gait, balance, and functional mobility with ADL. Patient is having to modify and restrict ADL as indicated by outcome measure score as well as subjective information and objective measures which is affecting overall participation. Patient will benefit from skilled physical therapy in order to improve function and reduce impairment.   OBJECTIVE IMPAIRMENTS Abnormal gait, decreased activity tolerance, decreased endurance, decreased mobility, difficulty walking, decreased ROM, decreased strength, impaired flexibility, and pain.   ACTIVITY LIMITATIONS bending, standing, squatting, stairs, transfers, and locomotion level  PARTICIPATION LIMITATIONS: cleaning, shopping, community activity, and yard work  [place] Age and 1-2 comorbidities: HTN, patellar fracture with ORIF  are also affecting patient's functional outcome.   REHAB POTENTIAL: Good  CLINICAL DECISION MAKING: Stable/uncomplicated  EVALUATION COMPLEXITY: Low   GOALS: Goals reviewed with patient? Yes  SHORT TERM GOALS:  Target date: 03/06/2022  Patient will be independent with HEP in order to improve functional outcomes. Baseline:  Goal status: INITIAL  2.  Patient will report at least 25% improvement in symptoms for improved quality of life. Baseline:  Goal status: INITIAL  3.  Patient will score at least 23/24 on DGI in order to demonstrate improved balance to reduce the risk for falls at home and in the community.  Baseline: 21/24 Goal status: INITIAL   LONG TERM GOALS: Target date: 03/27/2022  Patient will report at least 75% improvement in symptoms for improved quality of life. Baseline:  Goal status: INITIAL  2.  Patient will be able to complete 5x STS in under 11.4 seconds in order to reduce the risk of falls. Baseline: not tested, most pain with transfers Goal status: INITIAL  3.  Patient will be able to navigate stairs with reciprocal pattern without compensation or UE use in order to demonstrate improved LE strength. Baseline: unilateral UE support, slightly unsteady/decreased control Goal status: INITIAL  4.  Patient will be able to ambulate at least 525 feet in 2MWT in order to demonstrate improved tolerance to activity. Baseline: 475 feet Goal status: INITIAL  PLAN: PT FREQUENCY: 1x/week  PT DURATION: 6 weeks  PLANNED INTERVENTIONS: Therapeutic exercises, Therapeutic activity, Neuromuscular re-education, Balance training, Gait training, Patient/Family education, Joint manipulation, Joint mobilization, Stair training, Orthotic/Fit training, DME instructions, Aquatic Therapy, Holden Needling, Electrical stimulation, Spinal manipulation, Spinal mobilization, Cryotherapy, Moist heat, Compression bandaging, scar mobilization, Splintting, Taping, Traction, Ultrasound, Ionotophoresis '4mg'$ /ml Dexamethasone, and Manual therapy   PLAN FOR NEXT SESSION: quad/glute strength, functional strengthening, balance training   Mearl Latin, PT 02/13/2022, 9:16 AM

## 2022-03-05 ENCOUNTER — Ambulatory Visit
Admission: EM | Admit: 2022-03-05 | Discharge: 2022-03-05 | Disposition: A | Discharge status: Home / Self Care | Payer: Medicare HMO | Attending: Nurse Practitioner | Admitting: Nurse Practitioner

## 2022-03-05 DIAGNOSIS — Z23 Encounter for immunization: Secondary | ICD-10-CM

## 2022-03-05 DIAGNOSIS — S81811A Laceration without foreign body, right lower leg, initial encounter: Secondary | ICD-10-CM

## 2022-03-05 MED ORDER — TETANUS-DIPHTH-ACELL PERTUSSIS 5-2.5-18.5 LF-MCG/0.5 IM SUSY
0.5000 mL | PREFILLED_SYRINGE | Freq: Once | INTRAMUSCULAR | Status: AC
  Administered 2022-03-05: 0.5 mL via INTRAMUSCULAR

## 2022-03-05 NOTE — ED Triage Notes (Signed)
Pt presents with right lower leg skin tear. Pt initially hurt leg last week moving carpet then had fall getting on exercise equipment this am

## 2022-03-05 NOTE — Discharge Instructions (Addendum)
-   We have updated your tetanus shot today - Please start cleaning the area twice daily with soap and water and continue to keep it covered - If you start developing pain, drainage, or redness around the site, please follow up here or with your primary care provider - You can use Tylenol or ice to help with any pain that develops

## 2022-03-05 NOTE — ED Provider Notes (Signed)
[facility]    CSN: [id number] Arrival date & time: 03/05/22  0630      History   Chief Complaint Chief Complaint  Patient presents with   Leg Injury    HPI Brittany Holden is a 88 y.o. female.   Patient presents with son today for injury to her right lower leg/shin area.  Patient reports she cut her shin open about 1 week ago while moving carpet.  Reports this morning, while at the gym, she bumped her leg on an exercise bike and it started bruising.  Her son brought her in today with concerns about wound care and infection.  The patient denies any pain in the area, any oozing from the area.  She has been keeping it covered with a Band-Aid.  She denies any redness, swelling, oozing, pus, fevers, nausea/vomiting.  She does not know the last time she had her tetanus shot.     Past Medical History:  Diagnosis Date   Arrhythmia    Diverticulosis of colon (without mention of hemorrhage)    Dysrhythmia    Hypertension    Hypothyroidism    IBS (irritable bowel syndrome)    Neuropathy    Palpitations    Personal history of colonic polyps 1999 & 2007   hyperplastic     Patient Active Problem List   Diagnosis Date Noted   Chest pain with high risk for cardiac etiology 11/11/2014   Chest pain 11/11/2014   CKD (chronic kidney disease) stage 3, GFR 30-59 ml/min ([place]) 11/11/2014   Essential hypertension 11/11/2014   Hyperlipidemia 11/11/2014    Past Surgical History:  Procedure Laterality Date   ABDOMINAL HYSTERECTOMY     FOOT ARTHRODESIS     pipj digits  2-4 left foot   ORIF PATELLA Left 11/06/2021   Procedure: OPEN REDUCTION INTERNAL (ORIF) FIXATION PATELLA;  Surgeon: Mordecai Rasmussen, MD;  Location: [facility];  Service: Orthopedics;  Laterality: Left;   TENDON REPAIR Right 05/18/2021   Procedure: RIGHT RING AND RIGHT SMALL TENDON TRANSFER VS EXTENSOR INDICIS PROPRIUS TO RING AND SMALL FINGER TRANSFER; DISTAL RADIOULNAR JOINT DEBRIDEMENT ULNA  DISTAL RESECTION WITH  STABILIZATION;  Surgeon: [name], MD;  Location: [facility];  Service: Orthopedics;  Laterality: Right;   TONSILLECTOMY  06/26/2012   Procedure: TONSILLECTOMY;  Surgeon: Ascencion Dike, MD;  Location: [facility];  Service: ENT;  Laterality: Right;   VAGINAL HYSTERECTOMY      OB History     Gravida  1   Para  1   Term  1   Preterm  0   AB  0   Living         SAB  0   IAB  0   Ectopic  0   Multiple      Live Births               Home Medications    Prior to Admission medications   Medication Sig Start Date End Date Taking? Authorizing Provider  chlorthalidone (HYGROTON) 25 MG tablet Take 25 mg by mouth every other day. 11/02/20 01/02/22  [provider]  cholecalciferol (VITAMIN D3) 10 MCG (400 UNIT) TABS tablet Take 400 Units by mouth daily.    [provider]  Cyanocobalamin (VITAMIN B-12 CR PO) Take by mouth.    [provider]  levothyroxine (SYNTHROID, LEVOTHROID) 75 MCG tablet Take 75 mcg by mouth daily.      [provider]  lidocaine (LIDODERM)  5 % Place 1 patch onto the skin daily. Remove & Discard patch within 12 hours or as directed by MD 05/21/21   Dorie Rank, MD  metoprolol succinate (TOPROL XL) 25 MG 24 hr tablet Take 1 tablet (25 mg total) by mouth daily. 04/20/20   Fay Records, MD  Multiple Minerals-Vitamins (CALCIUM & VIT D3 BONE HEALTH PO) Take 1 tablet by mouth daily.    [provider]  predniSONE (DELTASONE) 10 MG tablet 5, 4, 3, 2 then 1 tablet by mouth daily for 5 days total. 08/30/21   Idol, Almyra Free, PA-C  simvastatin (ZOCOR) 20 MG tablet Take 20 mg by mouth every morning.     [provider]  sodium zirconium cyclosilicate (LOKELMA) 5 g packet Take 5 g by mouth daily.    [provider]    Family History Family History  Problem Relation Age of Onset   CAD Neg Hx    Breast cancer Neg Hx     Social History Social History   Tobacco Use   Smoking status: Never    Smokeless tobacco: Never  Vaping Use   Vaping Use: Never used  Substance Use Topics   Alcohol use: No   Drug use: No     Allergies   Patient has no known allergies.   Review of Systems Review of Systems Per HPI  Physical Exam Triage Vital Signs ED Triage Vitals  Enc Vitals Group     BP 03/05/22 0859 130/80     Pulse Rate 03/05/22 0859 83     Resp 03/05/22 0859 20     Temp 03/05/22 0859 98.1 F (36.7 C)     Temp src --      SpO2 03/05/22 0859 95 %     Weight --      Height --      Head Circumference --      Peak Flow --      Pain Score 03/05/22 0857 0     Pain Loc --      Pain Edu? --      Excl. in [place]? --    No data found.  Updated Vital Signs BP 130/80   Pulse 83   Temp 98.1 F (36.7 C)   Resp 20   SpO2 95%   Visual Acuity Right Eye Distance:   Left Eye Distance:   Bilateral Distance:    Right Eye Near:   Left Eye Near:    Bilateral Near:     Physical Exam Vitals and nursing note reviewed.  Constitutional:      General: She is not in acute distress.    Appearance: Normal appearance. She is not toxic-appearing.  HENT:     Mouth/Throat:     Mouth: Mucous membranes are moist.     Pharynx: Oropharynx is clear.  Pulmonary:     Effort: Pulmonary effort is normal. No respiratory distress.  Musculoskeletal:     Right lower leg: No edema.     Left lower leg: No edema.  Skin:    General: Skin is warm and dry.     Capillary Refill: Capillary refill takes less than 2 seconds.     Coloration: Skin is not jaundiced or pale.     Findings: Bruising present. No erythema.          Comments: Approximately 3 cm skin tear with surrounding ecchymosis and area marked.  No fluctuance, warmth, drainage, odor.  Neurological:     Mental Status: She is  alert and oriented to person, place, and time.  Psychiatric:        Behavior: Behavior is cooperative.      UC Treatments / Results  Labs (all labs ordered are listed, but only abnormal results are  displayed) Labs Reviewed - No data to display  EKG   Radiology No results found.  Procedures Procedures (including critical care time)  Medications Ordered in UC Medications  Tdap (BOOSTRIX) injection 0.5 mL (0.5 mLs Intramuscular Given 03/05/22 0929)    Initial Impression / Assessment and Plan / UC Course  I have reviewed the triage vital signs and the nursing notes.  Pertinent labs & imaging results that were available during my care of the patient were reviewed by me and considered in my medical decision making (see chart for details).    Patient is a very pleasant, well-appearing 88 year old female with a skin tear to the right lower extremity.  This occurred more than a week ago, therefore wound closure is not indicated today.  We will update Tdap today.  Discussed wound care and at length.  No signs of infection or red flags on exam or in history today.  Encouraged following up if signs of infection develop either here or with primary care provider.  The patient was given the opportunity to ask questions.  All questions answered to their satisfaction.  The patient is in agreement to this plan.   Final Clinical Impressions(s) / UC Diagnoses   Final diagnoses:  Noninfected skin tear of right lower extremity, initial encounter  Need for Tdap vaccination     Discharge Instructions      - We have updated your tetanus shot today - Please start cleaning the area twice daily with soap and water and continue to keep it covered - If you start developing pain, drainage, or redness around the site, please follow up here or with your primary care provider - You can use Tylenol or ice to help with any pain that develops    ED Prescriptions   None    PDMP not reviewed this encounter.   Eulogio Bear, NP 03/05/22 [id number]

## 2022-03-08 ENCOUNTER — Ambulatory Visit (HOSPITAL_COMMUNITY): Payer: Medicare HMO | Admitting: Physical Therapy

## 2022-03-13 ENCOUNTER — Encounter (HOSPITAL_COMMUNITY): Payer: Medicare HMO | Admitting: Physical Therapy

## 2022-03-15 ENCOUNTER — Encounter (HOSPITAL_COMMUNITY): Payer: Medicare HMO | Admitting: Physical Therapy

## 2022-03-20 ENCOUNTER — Encounter (HOSPITAL_COMMUNITY): Payer: Medicare HMO | Admitting: Physical Therapy

## 2022-03-27 ENCOUNTER — Encounter (HOSPITAL_COMMUNITY): Payer: Medicare HMO | Admitting: Physical Therapy

## 2022-03-28 DIAGNOSIS — I129 Hypertensive chronic kidney disease with stage 1 through stage 4 chronic kidney disease, or unspecified chronic kidney disease: Secondary | ICD-10-CM | POA: Diagnosis not present

## 2022-03-28 DIAGNOSIS — N1832 Chronic kidney disease, stage 3b: Secondary | ICD-10-CM | POA: Diagnosis not present

## 2022-03-28 DIAGNOSIS — E875 Hyperkalemia: Secondary | ICD-10-CM | POA: Diagnosis not present

## 2022-03-28 DIAGNOSIS — N281 Cyst of kidney, acquired: Secondary | ICD-10-CM | POA: Diagnosis not present

## 2022-03-28 DIAGNOSIS — I5032 Chronic diastolic (congestive) heart failure: Secondary | ICD-10-CM | POA: Diagnosis not present

## 2022-04-04 DIAGNOSIS — R809 Proteinuria, unspecified: Secondary | ICD-10-CM | POA: Diagnosis not present

## 2022-04-04 DIAGNOSIS — I129 Hypertensive chronic kidney disease with stage 1 through stage 4 chronic kidney disease, or unspecified chronic kidney disease: Secondary | ICD-10-CM | POA: Diagnosis not present

## 2022-04-04 DIAGNOSIS — N1832 Chronic kidney disease, stage 3b: Secondary | ICD-10-CM | POA: Diagnosis not present

## 2022-04-04 DIAGNOSIS — I5032 Chronic diastolic (congestive) heart failure: Secondary | ICD-10-CM | POA: Diagnosis not present

## 2022-04-04 DIAGNOSIS — N858 Other specified noninflammatory disorders of uterus: Secondary | ICD-10-CM | POA: Diagnosis not present

## 2022-04-04 DIAGNOSIS — D638 Anemia in other chronic diseases classified elsewhere: Secondary | ICD-10-CM | POA: Diagnosis not present

## 2022-04-04 DIAGNOSIS — E875 Hyperkalemia: Secondary | ICD-10-CM | POA: Diagnosis not present

## 2022-04-20 DIAGNOSIS — T7840XA Allergy, unspecified, initial encounter: Secondary | ICD-10-CM | POA: Diagnosis not present

## 2022-05-04 ENCOUNTER — Ambulatory Visit (INDEPENDENT_AMBULATORY_CARE_PROVIDER_SITE_OTHER): Payer: Medicare HMO | Admitting: Obstetrics & Gynecology

## 2022-05-04 ENCOUNTER — Encounter: Payer: Self-pay | Admitting: Obstetrics & Gynecology

## 2022-05-04 VITALS — BP 167/85 | HR 77 | Ht 72.0 in

## 2022-05-04 DIAGNOSIS — N83202 Unspecified ovarian cyst, left side: Secondary | ICD-10-CM | POA: Diagnosis not present

## 2022-05-04 DIAGNOSIS — Z90710 Acquired absence of both cervix and uterus: Secondary | ICD-10-CM | POA: Diagnosis not present

## 2022-05-04 DIAGNOSIS — Z780 Asymptomatic menopausal state: Secondary | ICD-10-CM | POA: Diagnosis not present

## 2022-05-04 NOTE — Progress Notes (Signed)
   GYN VISIT Patient name: Brittany Holden MRN [id number]  Date of birth: [birth year] Chief Complaint:   Gynecologic Exam (Referral for cyst in adnexa 5 cm)  History of Present Illness:   Brittany Holden is a 88 y.o. G1P1001 PM, [place] female being seen today for left ovarian cyst.  Incidental left ovarian cyst found on MRI (images below).  She denies pelvic or abdominal pain.  Denies bloating or change in her appetite.  No acute gyn concerns.    07/2021- MRI reviewed- 5.2cm simple cyst of left ovary 02/2022- a fluid signal intensity structure along the left adnexa measures at least 5 cm in length. A similar lesion was shown on the prior MRI from 07/12/2021  No LMP recorded. Patient has had a hysterectomy.      No data to display           Review of Systems:   Pertinent items are noted in HPI Denies fever/chills, dizziness, headaches, visual disturbances, fatigue, shortness of breath, chest pain, abdominal pain, vomiting, no problems with bowel movements, urination, or intercourse unless otherwise stated above.  Pertinent History Reviewed:  Reviewed past medical,surgical, social, obstetrical and family history.  Reviewed problem list, medications and allergies. Physical Assessment:   Vitals:   05/04/22 1215  BP: (!) 167/85  Pulse: 77  Height: 6' (1.829 m)  Body mass index is 16.41 kg/m.       Physical Examination:   General appearance: alert, well appearing, and in no distress  Psych: mood appropriate, normal affect  Skin: warm & dry   Cardiovascular: normal heart rate noted  Respiratory: normal respiratory effort, no distress  Abdomen: soft, non-tender   Pelvic: VULVA: normal appearing vulva with no masses, tenderness or lesions, VAGINA: pink flat mucosa.  On bimanual exam- no massed or fullness appreciated, no tenderness on exam, atrophic changes noted  Extremities: no edema, no calf tenderness bilaterally  Chaperone:  pt declined, family member present     Assessment &  Plan:  1) Left ovarian cyst -reviewed findings suggestive of benign cyst  -pt currently asymptomatic -plan for pelvic US in 12 mos   Return in about 1 year (around 05/05/2023) for pelvic US followed by visit with Eure/Emina Ribaudo.   [name], DO Attending [place], [facility] for [employer], [place]

## 2022-05-21 DIAGNOSIS — S61512A Laceration without foreign body of left wrist, initial encounter: Secondary | ICD-10-CM | POA: Diagnosis not present

## 2022-05-21 DIAGNOSIS — S61511A Laceration without foreign body of right wrist, initial encounter: Secondary | ICD-10-CM | POA: Diagnosis not present

## 2022-05-21 DIAGNOSIS — T148XXA Other injury of unspecified body region, initial encounter: Secondary | ICD-10-CM | POA: Diagnosis not present

## 2022-07-10 DIAGNOSIS — E039 Hypothyroidism, unspecified: Secondary | ICD-10-CM | POA: Diagnosis not present

## 2022-07-10 DIAGNOSIS — E782 Mixed hyperlipidemia: Secondary | ICD-10-CM | POA: Diagnosis not present

## 2022-07-10 DIAGNOSIS — R7301 Impaired fasting glucose: Secondary | ICD-10-CM | POA: Diagnosis not present

## 2022-07-17 DIAGNOSIS — E782 Mixed hyperlipidemia: Secondary | ICD-10-CM | POA: Diagnosis not present

## 2022-07-17 DIAGNOSIS — R7301 Impaired fasting glucose: Secondary | ICD-10-CM | POA: Diagnosis not present

## 2022-07-17 DIAGNOSIS — I10 Essential (primary) hypertension: Secondary | ICD-10-CM | POA: Diagnosis not present

## 2022-07-17 DIAGNOSIS — S82002D Unspecified fracture of left patella, subsequent encounter for closed fracture with routine healing: Secondary | ICD-10-CM | POA: Diagnosis not present

## 2022-07-17 DIAGNOSIS — E039 Hypothyroidism, unspecified: Secondary | ICD-10-CM | POA: Diagnosis not present

## 2022-07-17 DIAGNOSIS — I700 Atherosclerosis of aorta: Secondary | ICD-10-CM | POA: Diagnosis not present

## 2022-07-17 DIAGNOSIS — E875 Hyperkalemia: Secondary | ICD-10-CM | POA: Diagnosis not present

## 2022-07-17 DIAGNOSIS — N1831 Chronic kidney disease, stage 3a: Secondary | ICD-10-CM | POA: Diagnosis not present

## 2022-07-17 DIAGNOSIS — R269 Unspecified abnormalities of gait and mobility: Secondary | ICD-10-CM | POA: Diagnosis not present

## 2022-07-27 ENCOUNTER — Telehealth: Payer: Self-pay

## 2022-07-27 NOTE — Telephone Encounter (Signed)
Message pulled from vm. Dan Europe ([facility]) calling to schedule appt for [facility]. No answer. Left vm asking for call back on Monday to get patient scheduled. Encourage him to send a mychart message if able.

## 2022-08-04 DIAGNOSIS — R000 Tachycardia, unspecified: Secondary | ICD-10-CM | POA: Diagnosis not present

## 2022-08-04 DIAGNOSIS — N1832 Chronic kidney disease, stage 3b: Secondary | ICD-10-CM | POA: Diagnosis not present

## 2022-08-04 DIAGNOSIS — I5032 Chronic diastolic (congestive) heart failure: Secondary | ICD-10-CM | POA: Diagnosis not present

## 2022-08-04 DIAGNOSIS — E875 Hyperkalemia: Secondary | ICD-10-CM | POA: Diagnosis not present

## 2022-08-04 DIAGNOSIS — I129 Hypertensive chronic kidney disease with stage 1 through stage 4 chronic kidney disease, or unspecified chronic kidney disease: Secondary | ICD-10-CM | POA: Diagnosis not present

## 2022-08-04 DIAGNOSIS — M791 Myalgia, unspecified site: Secondary | ICD-10-CM | POA: Diagnosis not present

## 2022-08-04 DIAGNOSIS — N858 Other specified noninflammatory disorders of uterus: Secondary | ICD-10-CM | POA: Diagnosis not present

## 2022-08-10 DIAGNOSIS — N1832 Chronic kidney disease, stage 3b: Secondary | ICD-10-CM | POA: Diagnosis not present

## 2022-08-20 ENCOUNTER — Emergency Department (HOSPITAL_COMMUNITY): Payer: Medicare HMO

## 2022-08-20 ENCOUNTER — Emergency Department (HOSPITAL_COMMUNITY)
Admission: EM | Admit: 2022-08-20 | Discharge: 2022-08-20 | Disposition: A | Discharge status: Home / Self Care | Payer: Medicare HMO | Attending: Emergency Medicine | Admitting: Emergency Medicine

## 2022-08-20 ENCOUNTER — Other Ambulatory Visit: Payer: Self-pay

## 2022-08-20 ENCOUNTER — Encounter (HOSPITAL_COMMUNITY): Payer: Self-pay

## 2022-08-20 DIAGNOSIS — M25552 Pain in left hip: Secondary | ICD-10-CM | POA: Insufficient documentation

## 2022-08-20 DIAGNOSIS — Z79899 Other long term (current) drug therapy: Secondary | ICD-10-CM | POA: Insufficient documentation

## 2022-08-20 DIAGNOSIS — E039 Hypothyroidism, unspecified: Secondary | ICD-10-CM | POA: Insufficient documentation

## 2022-08-20 DIAGNOSIS — N189 Chronic kidney disease, unspecified: Secondary | ICD-10-CM | POA: Diagnosis not present

## 2022-08-20 DIAGNOSIS — M545 Low back pain, unspecified: Secondary | ICD-10-CM | POA: Diagnosis not present

## 2022-08-20 DIAGNOSIS — M5442 Lumbago with sciatica, left side: Secondary | ICD-10-CM | POA: Diagnosis not present

## 2022-08-20 DIAGNOSIS — I129 Hypertensive chronic kidney disease with stage 1 through stage 4 chronic kidney disease, or unspecified chronic kidney disease: Secondary | ICD-10-CM | POA: Insufficient documentation

## 2022-08-20 MED ORDER — PREDNISONE 10 MG PO TABS: 40.0000 mg | ORAL_TABLET | Freq: Every day | ORAL | 0 refills | Status: DC

## 2022-08-20 MED ORDER — LIDOCAINE 5 % EX PTCH
1.0000 | MEDICATED_PATCH | CUTANEOUS | Status: DC
  Administered 2022-08-20: 1 via TRANSDERMAL
  Filled 2022-08-20: qty 1

## 2022-08-20 MED ORDER — LIDOCAINE 5 % EX PTCH: 1.0000 | MEDICATED_PATCH | CUTANEOUS | 0 refills | Status: DC

## 2022-08-20 NOTE — ED Provider Notes (Signed)
[facility] EMERGENCY DEPARTMENT Provider Note   CSN: [id number] Arrival date & time: 08/20/22  0131     History  Chief Complaint  Patient presents with   Hip Pain    Brittany Holden is a 88 y.o. female.   Hip Pain   88 year old female presents emergency department complaints of left-sided hip pain as well as low back pain.  Patient reports history of symptoms chronically of which have worsened over the past few days.  Was seen by primary care outpatient who told her that he "thinks that it is arthritis."  She notes left-sided back pain radiating down left buttock into posterior left thigh.  Denies any recent falls/traumas.  Denies fever, saddle anesthesia, bowel/bladder dysfunction, weakness/sensory deficits in lower extremities, no malignancy, prolonged steroid use, history of IV drug use.  She has tried at home Tylenol which has helped some.  Patient is requesting imaging of both hip and back.  Past medical history significant for hypertension, CKD stage III, IBS, neuropathy, hypothyroidism  Home Medications Prior to Admission medications   Medication Sig Start Date End Date Taking? Authorizing Provider  lidocaine (LIDODERM) 5 % Place 1 patch onto the skin daily. Remove & Discard patch within 12 hours or as directed by MD 08/20/22  Yes Dion Saucier A, PA  predniSONE (DELTASONE) 10 MG tablet Take 4 tablets (40 mg total) by mouth daily. 08/20/22  Yes Dion Saucier A, PA  chlorthalidone (HYGROTON) 25 MG tablet Take 25 mg by mouth every other day. 11/02/20 05/05/23  [provider]  Cholecalciferol 25 MCG (1000 UT) tablet Take 400 Units by mouth daily.    [provider]  Cyanocobalamin (VITAMIN B-12 CR PO) Take by mouth. Patient not taking: Reported on 05/04/2022    [provider]  levothyroxine (SYNTHROID, LEVOTHROID) 75 MCG tablet Take 75 mcg by mouth daily.      [provider]  metoprolol succinate (TOPROL XL) 25 MG 24 hr tablet Take 1 tablet  (25 mg total) by mouth daily. 04/20/20   Fay Records, MD  Multiple Minerals-Vitamins (CALCIUM & VIT D3 BONE HEALTH PO) Take 1 tablet by mouth daily. Patient not taking: Reported on 05/04/2022    [provider]  simvastatin (ZOCOR) 20 MG tablet Take 20 mg by mouth every morning.     [provider]  sodium zirconium cyclosilicate (LOKELMA) 5 g packet Take 5 g by mouth daily.    [provider]      Allergies    Patient has no known allergies.    Review of Systems   Review of Systems  All other systems reviewed and are negative.   Physical Exam Updated Vital Signs BP (!) 138/102   Pulse 93   Temp 99.5 F (37.5 C) (Oral)   Resp 18   Ht 6' (1.829 m)   Wt 55.8 kg   SpO2 96%   BMI 16.68 kg/m  Physical Exam Vitals and nursing note reviewed.  Constitutional:      General: She is not in acute distress.    Appearance: She is well-developed.  HENT:     Head: Normocephalic and atraumatic.  Eyes:     Conjunctiva/sclera: Conjunctivae normal.  Cardiovascular:     Rate and Rhythm: Normal rate and regular rhythm.     Heart sounds: No murmur heard. Pulmonary:     Effort: Pulmonary effort is normal. No respiratory distress.     Breath sounds: Normal breath sounds.  Abdominal:     Palpations:  Abdomen is soft.     Tenderness: There is no abdominal tenderness.  Musculoskeletal:        General: No swelling.     Cervical back: Neck supple.     Comments: No midline tenderness of the cervical, thoracic, lumbar spine with no obvious step-off or deformity.  Paraspinal tenderness on the left side in the lumbar region.  Patient is tender to palpation of left hip.  No overlying skin abnormalities noted.  Muscle strength 5 out of 5 for lower extremities.  Patient complaining of sensory deficits along major nerve distributions lower extremities.  DTR symmetric and equal bilaterally.  Posterior tibial pulses full and intact bilaterally.  Skin:    General: Skin is warm and  dry.     Capillary Refill: Capillary refill takes less than 2 seconds.  Neurological:     Mental Status: She is alert.  Psychiatric:        Mood and Affect: Mood normal.     ED Results / Procedures / Treatments   Labs (all labs ordered are listed, but only abnormal results are displayed) Labs Reviewed - No data to display  EKG None  Radiology DG Lumbar Spine Complete  Result Date: 08/20/2022 CLINICAL DATA:  Low back and left hip pain radiating down the left leg. EXAM: LUMBAR SPINE - COMPLETE 4+ VIEW COMPARISON:  Lumbar spine radiographs 05/21/2021 FINDINGS: There is transitional lumbosacral anatomy. For consistency with the prior report, the transitional segment will be considered a partially sacralized L5. Grade 1 anterolisthesis of L3 on L4 is unchanged. There is mild lumbar levoscoliosis. No acute fracture is identified. Mild diffuse lumbar disc degeneration and moderate facet arthrosis are similar to the prior study. Aortic atherosclerosis and a large amount of colonic stool are noted. IMPRESSION: 1. No acute osseous abnormality. 2. Similar appearance of lumbar disc and facet degeneration. Electronically Signed   By: Logan Bores M.D.   On: 08/20/2022 10:00   DG Hip Unilat With Pelvis 2-3 Views Left  Result Date: 08/20/2022 CLINICAL DATA:  Fall with LEFT hip pain. EXAM: DG HIP (WITH OR WITHOUT PELVIS) 2-3V LEFT COMPARISON:  Pelvic evaluation of August 30, 2021 FINDINGS: Osteopenia. Stool and gas overlies the sacrum limiting assessment of sacrum and iliac crests. No sign of pelvic or LEFT hip fracture.  No sign of dislocation. IMPRESSION: 1. No acute fracture or dislocation. 2. Osteopenia. 3. Stool and gas overlies the sacrum limiting assessment of sacrum and iliac crests. Electronically Signed   By: Zetta Bills M.D.   On: 08/20/2022 09:46    Procedures Procedures    Medications Ordered in ED Medications  lidocaine (LIDODERM) 5 % 1 patch (has no administration in time range)     ED Course/ Medical Decision Making/ A&P                           Medical Decision Making Amount and/or Complexity of Data Reviewed Radiology: ordered.   This patient presents to the ED for concern of left hip/back pain, this involves an extensive number of treatment options, and is a complaint that carries with it a high risk of complications and morbidity.  The differential diagnosis includes fracture, strain sprain, dislocation, cauda equina, spinal epidural abscess, osteoarthritis, rheumatoid arthritis, septic arthritis   Co morbidities that complicate the patient evaluation  See HPI   Additional history obtained:  Additional history obtained from EMR External records from outside source obtained and reviewed including hospital records  Lab Tests:  N/a   Imaging Studies ordered:  I ordered imaging studies including lumbar and left hip x-ray I independently visualized and interpreted imaging which showed  Lumbar x-ray: No acute osseous abnormality.  Similar appearance of lumbar disc and facet degeneration from prior x-rays obtained. Left hip x-ray: No acute fracture or dislocation.  Osteopenia.  Stool and gas overlying sacrum with limited assessment of sacrum and iliac crest I agree with the radiologist interpretation  Cardiac Monitoring: / EKG:  The patient was maintained on a cardiac monitor.  I personally viewed and interpreted the cardiac monitored which showed an underlying rhythm of: Sinus rhythm   Consultations Obtained:  N/a   Problem List / ED Course / Critical interventions / Medication management  Left back pain with sciatica I ordered medication including Lidoderm patch for pain   Reevaluation of the patient after these medicines showed that the patient improved I have reviewed the patients home medicines and have made adjustments as needed   Social Determinants of Health:  Denies tobacco, illicit drug use   Test / Admission -  Considered:  Left-sided back pain with sciatica Vitals signs within normal range and stable throughout visit. Laboratory/imaging studies significant for: See above Patient symptoms likely secondary to left-sided back pain with sciatica.  No evidence of acute bony abnormality.  No evidence of septic arthritis.  No evidence of cauda equina/spinal epidural abscess.  Given patient's history of CKD, will abstain from NSAID therapy at this time.  Patient to be recommended therapy at home with prednisone, Lidoderm patch as well as Tylenol for symptoms.  Recommend follow-up with primary care in 7 to 10 days for reevaluation.  Treatment plan discussed with patient and she acknowledged understanding was agreeable to said plan. Worrisome signs and symptoms were discussed with the patient, and the patient acknowledged understanding to return to the ED if noticed. Patient was stable upon discharge.          Final Clinical Impression(s) / ED Diagnoses Final diagnoses:  Acute left-sided low back pain with left-sided sciatica    Rx / DC Orders ED Discharge Orders          Ordered    lidocaine (LIDODERM) 5 %  Every 24 hours        08/20/22 1030    predniSONE (DELTASONE) 10 MG tablet  Daily        08/20/22 1030              Wilnette Kales, [place] 08/20/22 1041    Wynona Dove A, DO 08/24/22 [phone]

## 2022-08-20 NOTE — Discharge Instructions (Signed)
Noted to your visit to the emergency department today was overall reassuring.  Your x-ray showed no signs of acute fracture or dislocation.  Recommend treatment at home with Lidoderm patch, continue Tylenol as well as steroid in the form of prednisone as we talked about.  If Lidoderm patches not covered by insurance, look for over-the-counter Salonpas version.  Recommend reevaluation by your primary care within 7 to 10 days for reevaluation of your symptoms.  Please do not hesitate to return to emergency department for worrisome signs and symptoms we discussed become apparent.

## 2022-08-20 NOTE — ED Triage Notes (Signed)
Patient denies recent fall and states left hip pain that has gotten worse. PCP thinks it is arthritis but patient has never been dx per patient's son. Patient states tylenol has not been helping.

## 2022-08-22 DIAGNOSIS — E875 Hyperkalemia: Secondary | ICD-10-CM | POA: Diagnosis not present

## 2022-08-22 DIAGNOSIS — Z7182 Exercise counseling: Secondary | ICD-10-CM | POA: Diagnosis not present

## 2022-08-22 DIAGNOSIS — I10 Essential (primary) hypertension: Secondary | ICD-10-CM | POA: Diagnosis not present

## 2022-08-22 DIAGNOSIS — M5442 Lumbago with sciatica, left side: Secondary | ICD-10-CM | POA: Diagnosis not present

## 2022-08-22 DIAGNOSIS — Z681 Body mass index (BMI) 19 or less, adult: Secondary | ICD-10-CM | POA: Diagnosis not present

## 2022-08-22 DIAGNOSIS — R269 Unspecified abnormalities of gait and mobility: Secondary | ICD-10-CM | POA: Diagnosis not present

## 2022-08-22 DIAGNOSIS — Z713 Dietary counseling and surveillance: Secondary | ICD-10-CM | POA: Diagnosis not present

## 2022-09-06 DIAGNOSIS — R238 Other skin changes: Secondary | ICD-10-CM | POA: Diagnosis not present

## 2022-09-06 DIAGNOSIS — S00521A Blister (nonthermal) of lip, initial encounter: Secondary | ICD-10-CM | POA: Diagnosis not present

## 2022-09-07 DIAGNOSIS — X58XXXD Exposure to other specified factors, subsequent encounter: Secondary | ICD-10-CM | POA: Diagnosis not present

## 2022-09-07 DIAGNOSIS — S00512D Abrasion of oral cavity, subsequent encounter: Secondary | ICD-10-CM | POA: Diagnosis not present

## 2022-09-07 DIAGNOSIS — R238 Other skin changes: Secondary | ICD-10-CM | POA: Diagnosis not present

## 2022-09-19 ENCOUNTER — Ambulatory Visit (HOSPITAL_COMMUNITY): Payer: Medicare HMO | Admitting: Physical Therapy

## 2022-09-27 DIAGNOSIS — R238 Other skin changes: Secondary | ICD-10-CM | POA: Diagnosis not present

## 2022-09-27 DIAGNOSIS — S00521A Blister (nonthermal) of lip, initial encounter: Secondary | ICD-10-CM | POA: Diagnosis not present

## 2022-10-02 DIAGNOSIS — X58XXXA Exposure to other specified factors, initial encounter: Secondary | ICD-10-CM | POA: Diagnosis not present

## 2022-10-02 DIAGNOSIS — R238 Other skin changes: Secondary | ICD-10-CM | POA: Diagnosis not present

## 2022-10-02 DIAGNOSIS — S00521A Blister (nonthermal) of lip, initial encounter: Secondary | ICD-10-CM | POA: Diagnosis not present

## 2022-11-01 ENCOUNTER — Encounter: Payer: Self-pay | Admitting: Radiology

## 2022-11-06 DIAGNOSIS — N1831 Chronic kidney disease, stage 3a: Secondary | ICD-10-CM | POA: Diagnosis not present

## 2022-11-08 DIAGNOSIS — I5032 Chronic diastolic (congestive) heart failure: Secondary | ICD-10-CM | POA: Diagnosis not present

## 2022-11-08 DIAGNOSIS — I129 Hypertensive chronic kidney disease with stage 1 through stage 4 chronic kidney disease, or unspecified chronic kidney disease: Secondary | ICD-10-CM | POA: Diagnosis not present

## 2022-11-08 DIAGNOSIS — D638 Anemia in other chronic diseases classified elsewhere: Secondary | ICD-10-CM | POA: Diagnosis not present

## 2022-11-08 DIAGNOSIS — E875 Hyperkalemia: Secondary | ICD-10-CM | POA: Diagnosis not present

## 2022-11-08 DIAGNOSIS — N1832 Chronic kidney disease, stage 3b: Secondary | ICD-10-CM | POA: Diagnosis not present

## 2022-12-07 ENCOUNTER — Emergency Department (HOSPITAL_COMMUNITY)
Admission: EM | Admit: 2022-12-07 | Discharge: 2022-12-07 | Disposition: A | Discharge status: Home / Self Care | Payer: Medicare HMO | Attending: Student | Admitting: Student

## 2022-12-07 ENCOUNTER — Encounter (HOSPITAL_COMMUNITY): Payer: Self-pay

## 2022-12-07 ENCOUNTER — Other Ambulatory Visit: Payer: Self-pay

## 2022-12-07 DIAGNOSIS — N183 Chronic kidney disease, stage 3 unspecified: Secondary | ICD-10-CM | POA: Diagnosis not present

## 2022-12-07 DIAGNOSIS — Y929 Unspecified place or not applicable: Secondary | ICD-10-CM | POA: Diagnosis not present

## 2022-12-07 DIAGNOSIS — T24222A Burn of second degree of left knee, initial encounter: Secondary | ICD-10-CM | POA: Diagnosis not present

## 2022-12-07 DIAGNOSIS — T310 Burns involving less than 10% of body surface: Secondary | ICD-10-CM | POA: Diagnosis not present

## 2022-12-07 DIAGNOSIS — T24231A Burn of second degree of right lower leg, initial encounter: Secondary | ICD-10-CM | POA: Diagnosis not present

## 2022-12-07 DIAGNOSIS — Y93G3 Activity, cooking and baking: Secondary | ICD-10-CM | POA: Diagnosis not present

## 2022-12-07 DIAGNOSIS — T24122A Burn of first degree of left knee, initial encounter: Secondary | ICD-10-CM | POA: Insufficient documentation

## 2022-12-07 DIAGNOSIS — E039 Hypothyroidism, unspecified: Secondary | ICD-10-CM | POA: Diagnosis not present

## 2022-12-07 DIAGNOSIS — T24211A Burn of second degree of right thigh, initial encounter: Secondary | ICD-10-CM | POA: Diagnosis not present

## 2022-12-07 DIAGNOSIS — X100XXA Contact with hot drinks, initial encounter: Secondary | ICD-10-CM | POA: Insufficient documentation

## 2022-12-07 DIAGNOSIS — I129 Hypertensive chronic kidney disease with stage 1 through stage 4 chronic kidney disease, or unspecified chronic kidney disease: Secondary | ICD-10-CM | POA: Diagnosis not present

## 2022-12-07 DIAGNOSIS — T300 Burn of unspecified body region, unspecified degree: Secondary | ICD-10-CM

## 2022-12-07 DIAGNOSIS — Z79899 Other long term (current) drug therapy: Secondary | ICD-10-CM | POA: Insufficient documentation

## 2022-12-07 MED ORDER — BACITRACIN ZINC 500 UNIT/GM EX OINT
TOPICAL_OINTMENT | Freq: Two times a day (BID) | CUTANEOUS | Status: DC
  Administered 2022-12-07: 5 via TOPICAL
  Filled 2022-12-07: qty 4.5

## 2022-12-07 MED ORDER — BACITRACIN ZINC 500 UNIT/GM EX OINT: 1.0000 | TOPICAL_OINTMENT | Freq: Two times a day (BID) | CUTANEOUS | 0 refills | Status: DC

## 2022-12-07 NOTE — ED Notes (Signed)
Bilateral legs dressed and cleaned.
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
## 2022-12-10 DIAGNOSIS — T24212D Burn of second degree of left thigh, subsequent encounter: Secondary | ICD-10-CM | POA: Diagnosis not present

## 2022-12-10 DIAGNOSIS — X100XXD Contact with hot drinks, subsequent encounter: Secondary | ICD-10-CM | POA: Diagnosis not present

## 2022-12-10 DIAGNOSIS — T300 Burn of unspecified body region, unspecified degree: Secondary | ICD-10-CM | POA: Diagnosis not present

## 2022-12-10 DIAGNOSIS — T24222D Burn of second degree of left knee, subsequent encounter: Secondary | ICD-10-CM | POA: Diagnosis not present

## 2022-12-24 DIAGNOSIS — T24001D Burn of unspecified degree of unspecified site of right lower limb, except ankle and foot, subsequent encounter: Secondary | ICD-10-CM | POA: Diagnosis not present

## 2022-12-24 DIAGNOSIS — T24002D Burn of unspecified degree of unspecified site of left lower limb, except ankle and foot, subsequent encounter: Secondary | ICD-10-CM | POA: Diagnosis not present

## 2022-12-24 DIAGNOSIS — T300 Burn of unspecified body region, unspecified degree: Secondary | ICD-10-CM | POA: Diagnosis not present

## 2022-12-30 ENCOUNTER — Other Ambulatory Visit: Payer: Self-pay

## 2022-12-30 ENCOUNTER — Emergency Department (HOSPITAL_COMMUNITY): Payer: Medicare HMO

## 2022-12-30 ENCOUNTER — Emergency Department (HOSPITAL_COMMUNITY)
Admission: EM | Admit: 2022-12-30 | Discharge: 2022-12-30 | Disposition: A | Discharge status: Home / Self Care | Payer: Medicare HMO | Attending: Emergency Medicine | Admitting: Emergency Medicine

## 2022-12-30 ENCOUNTER — Encounter (HOSPITAL_COMMUNITY): Payer: Self-pay | Admitting: Emergency Medicine

## 2022-12-30 DIAGNOSIS — I10 Essential (primary) hypertension: Secondary | ICD-10-CM | POA: Diagnosis not present

## 2022-12-30 DIAGNOSIS — W182XXA Fall in (into) shower or empty bathtub, initial encounter: Secondary | ICD-10-CM | POA: Diagnosis not present

## 2022-12-30 DIAGNOSIS — E039 Hypothyroidism, unspecified: Secondary | ICD-10-CM | POA: Diagnosis not present

## 2022-12-30 DIAGNOSIS — S20211A Contusion of right front wall of thorax, initial encounter: Secondary | ICD-10-CM | POA: Insufficient documentation

## 2022-12-30 DIAGNOSIS — W19XXXA Unspecified fall, initial encounter: Secondary | ICD-10-CM

## 2022-12-30 DIAGNOSIS — S2090XA Unspecified superficial injury of unspecified parts of thorax, initial encounter: Secondary | ICD-10-CM | POA: Diagnosis present

## 2022-12-30 DIAGNOSIS — R0781 Pleurodynia: Secondary | ICD-10-CM | POA: Diagnosis not present
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
## 2022-12-30 NOTE — Discharge Instructions (Addendum)
Apply ice for 30 minutes at a time, 4 times a day.  Take ibuprofen or naproxen as needed for pain.  If you need additional pain relief, add acetaminophen. 

## 2022-12-30 NOTE — ED Provider Notes (Signed)
[place] [facility] AT [facility] Provider Note   CSN: [id number] Arrival date & time: 12/30/22  0602     History  Chief Complaint  Patient presents with   Brittany Holden is a 89 y.o. female.  The history is provided by the patient.  Fall  She has history of hypertension, hypothyroidism and comes in following a fall at home.  She was taking a bath 2 days ago and slipped that she was getting out of the tub injuring her right rib cage.  It is hurting more today, so she came in to be evaluated.  She denies head or neck injury.  She has been ambulatory.   Home Medications Prior to Admission medications   Medication Sig Start Date End Date Taking? Authorizing Provider  bacitracin ointment Apply 1 Application topically 2 (two) times daily. 12/07/22   Kommor, Madison, MD  chlorthalidone (HYGROTON) 25 MG tablet Take 25 mg by mouth every other day. 11/02/20 05/05/23  [provider]  Cholecalciferol 25 MCG (1000 UT) tablet Take 400 Units by mouth daily.    [provider]  Cyanocobalamin (VITAMIN B-12 CR PO) Take by mouth. Patient not taking: Reported on 05/04/2022    [provider]  levothyroxine (SYNTHROID, LEVOTHROID) 75 MCG tablet Take 75 mcg by mouth daily.      [provider]  lidocaine (LIDODERM) 5 % Place 1 patch onto the skin daily. Remove & Discard patch within 12 hours or as directed by MD 08/20/22   Sherian Maroon A, PA  metoprolol succinate (TOPROL XL) 25 MG 24 hr tablet Take 1 tablet (25 mg total) by mouth daily. 04/20/20   Pricilla Riffle, MD  Multiple Minerals-Vitamins (CALCIUM & VIT D3 BONE HEALTH PO) Take 1 tablet by mouth daily. Patient not taking: Reported on 05/04/2022    [provider]  predniSONE (DELTASONE) 10 MG tablet Take 4 tablets (40 mg total) by mouth daily. 08/20/22   Peter Garter, PA  simvastatin (ZOCOR) 20 MG tablet Take 20 mg by mouth every morning.     [provider]   sodium zirconium cyclosilicate (LOKELMA) 5 g packet Take 5 g by mouth daily.    [provider]      Allergies    Patient has no known allergies.    Review of Systems   Review of Systems  All other systems reviewed and are negative.   Physical Exam Updated Vital Signs BP (!) 141/90 (BP Location: Right Arm)   Pulse 82   Temp 97.6 F (36.4 C) (Oral) Comment: Simultaneous filing. User may not have seen previous data.  Resp 16 Comment: Simultaneous filing. User may not have seen previous data.  Ht 6' (1.829 m)   Wt 55.8 kg   SpO2 97%   BMI 16.68 kg/m  Physical Exam Vitals and nursing note reviewed.   89 year old female, resting comfortably and in no acute distress. Vital signs are significant for borderline elevated blood pressure. Oxygen saturation is 97%, which is normal. Head is normocephalic and atraumatic. PERRLA, EOMI. Neck is nontender and supple without adenopathy or JVD. Back is nontender and there is no CVA tenderness. Lungs are clear without rales, wheezes, or rhonchi. Chest is mildly tender in the right anterior lateral rib cage.  There is no crepitus.Marland Kitchen Heart has regular rate and rhythm without murmur. Abdomen is soft, flat, nontender. Extremities have no cyanosis or edema, full range of motion is present. Skin is  warm and dry without rash. Neurologic: Mental status is normal, cranial nerves are intact, moves all extremities equally.  ED Results / Procedures / Treatments    Radiology DG Ribs Unilateral W/Chest Right  Result Date: 12/30/2022 CLINICAL DATA:  Fall onto right side with rib pain EXAM: RIGHT RIBS AND CHEST - 3 VIEW COMPARISON:  08/21/2019 FINDINGS: Subjective osteopenia. Thoracolumbar spine degeneration with thoracolumbar levoscoliosis. There is no edema, consolidation, effusion, or pneumothorax. Normal heart size and mediastinal contours. IMPRESSION: No acute finding. Electronically Signed   By: Tiburcio Pea M.D.   On: 12/30/2022 06:58     Procedures Procedures    Medications Ordered in ED Medications - No data to display  ED Course/ Medical Decision Making/ A&P                             Medical Decision Making Amount and/or Complexity of Data Reviewed Radiology: ordered.   Fall with injury to right rib cage.  I have ordered rib x-rays.  X-rays show no fracture.  Have independently viewed the images, and agree with the radiologist's interpretation.  I am discharging the patient with instructions to apply ice, use over-the-counter NSAIDs and acetaminophen as needed for pain.  Final Clinical Impression(s) / ED Diagnoses Final diagnoses:  Fall at home, initial encounter  Contusion of right chest wall, initial encounter    Rx / DC Orders ED Discharge Orders     None         Dione Booze, MD 12/30/22 [phone]

## 2022-12-30 NOTE — ED Notes (Signed)
Patient transported to X-ray 

## 2023-01-09 DIAGNOSIS — E039 Hypothyroidism, unspecified: Secondary | ICD-10-CM | POA: Diagnosis not present

## 2023-01-09 DIAGNOSIS — R7301 Impaired fasting glucose: Secondary | ICD-10-CM | POA: Diagnosis not present

## 2023-01-09 DIAGNOSIS — E782 Mixed hyperlipidemia: Secondary | ICD-10-CM | POA: Diagnosis not present

## 2023-01-15 DIAGNOSIS — I700 Atherosclerosis of aorta: Secondary | ICD-10-CM | POA: Diagnosis not present

## 2023-01-15 DIAGNOSIS — I10 Essential (primary) hypertension: Secondary | ICD-10-CM | POA: Diagnosis not present

## 2023-01-15 DIAGNOSIS — N1832 Chronic kidney disease, stage 3b: Secondary | ICD-10-CM | POA: Diagnosis not present

## 2023-01-15 DIAGNOSIS — E875 Hyperkalemia: Secondary | ICD-10-CM | POA: Diagnosis not present

## 2023-01-15 DIAGNOSIS — R269 Unspecified abnormalities of gait and mobility: Secondary | ICD-10-CM | POA: Diagnosis not present

## 2023-01-15 DIAGNOSIS — M2550 Pain in unspecified joint: Secondary | ICD-10-CM | POA: Diagnosis not present

## 2023-01-15 DIAGNOSIS — Z0000 Encounter for general adult medical examination without abnormal findings: Secondary | ICD-10-CM | POA: Diagnosis not present

## 2023-01-15 DIAGNOSIS — I129 Hypertensive chronic kidney disease with stage 1 through stage 4 chronic kidney disease, or unspecified chronic kidney disease: Secondary | ICD-10-CM | POA: Diagnosis not present

## 2023-01-15 DIAGNOSIS — M5442 Lumbago with sciatica, left side: Secondary | ICD-10-CM | POA: Diagnosis not present

## 2023-03-04 DIAGNOSIS — R21 Rash and other nonspecific skin eruption: Secondary | ICD-10-CM | POA: Diagnosis not present

## 2023-03-05 IMAGING — US US RENAL
1 series · 13 of 25 positions shown · non-contrast
Comparison: 08/15/2020

CLINICAL DATA: Chronic kidney disease

EXAM:
RENAL / URINARY TRACT ULTRASOUND COMPLETE

[Series 1: us renal · 13 of 50 slices shown]
[im 1/50]
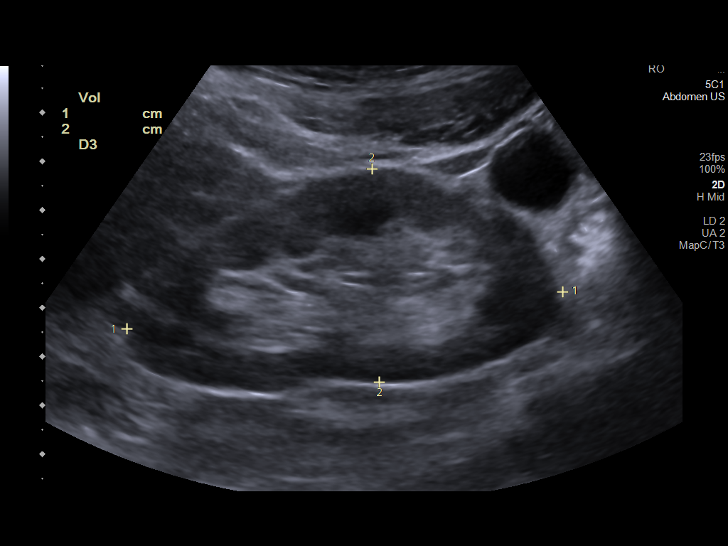
[im 5/50]
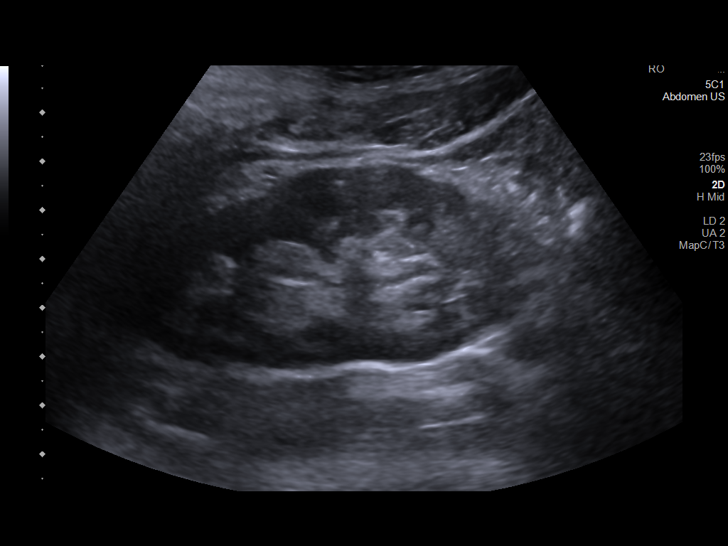
[im 9/50]
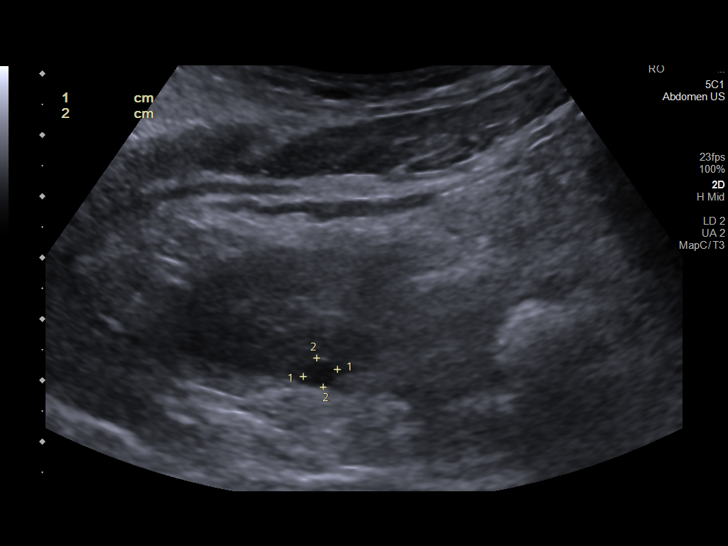
[im 13/50]
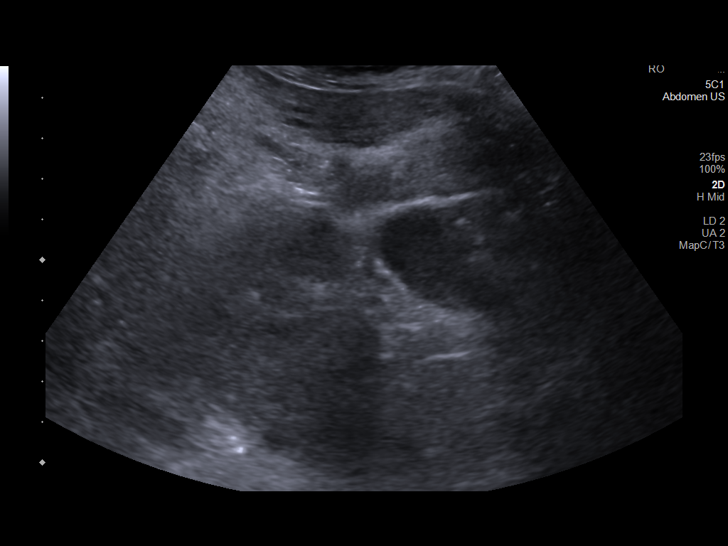
[im 17/50]
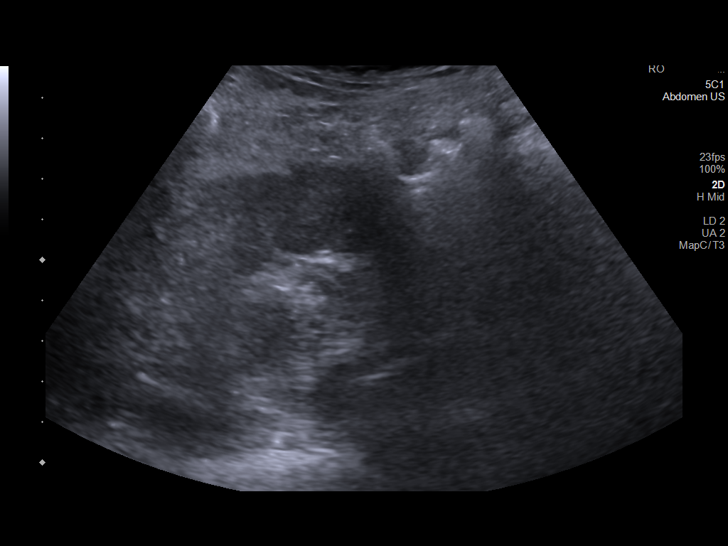
[im 21/50]
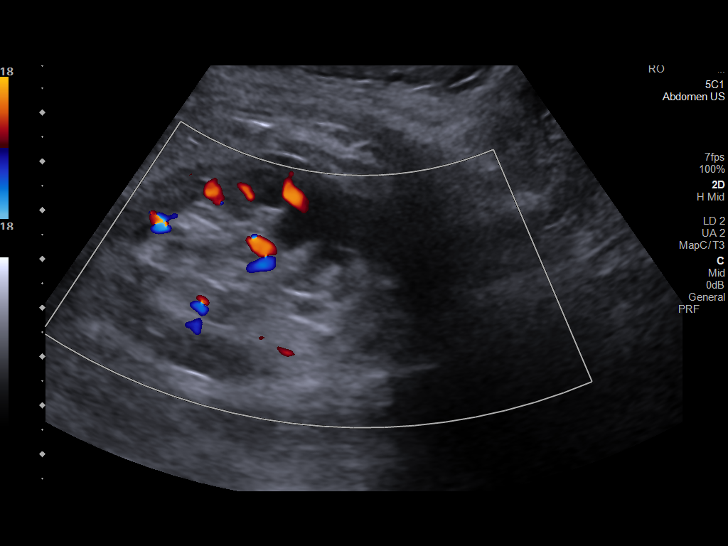
[im 25/50]
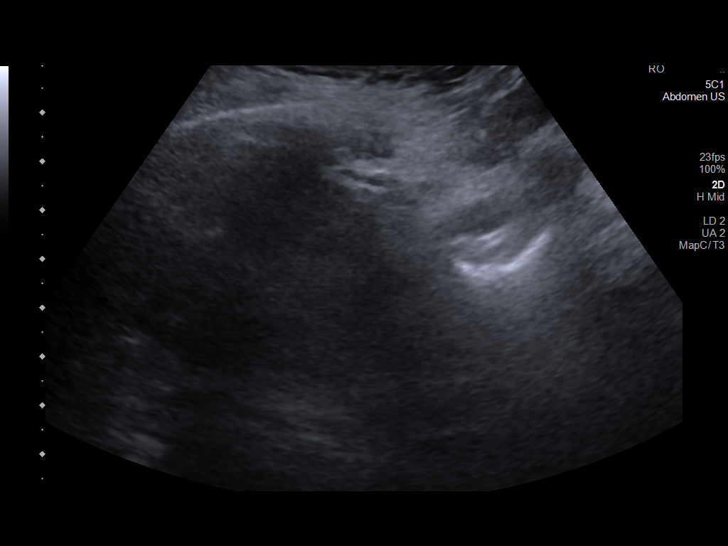
[im 29/50]
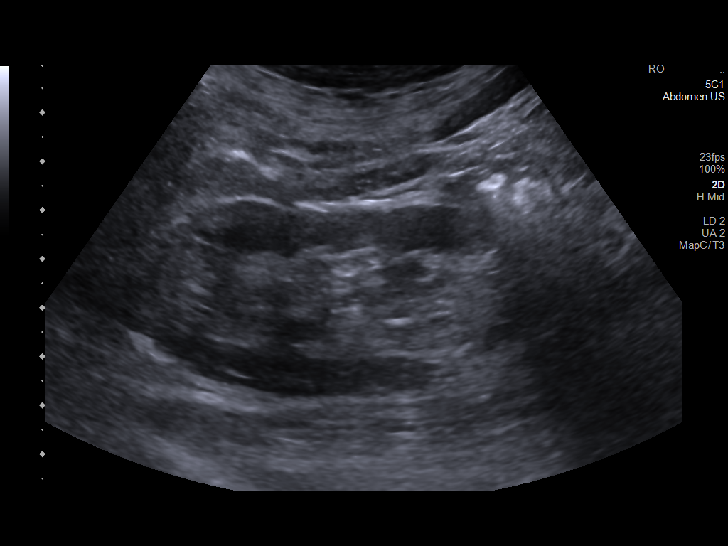
[im 33/50]
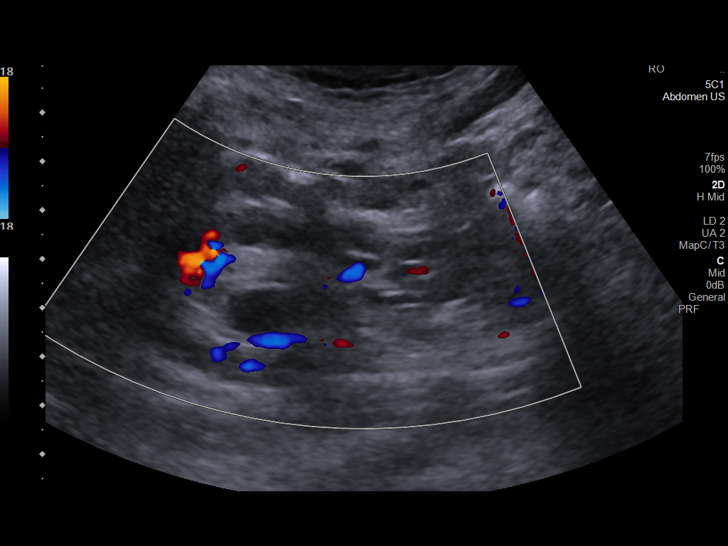
[im 37/50]
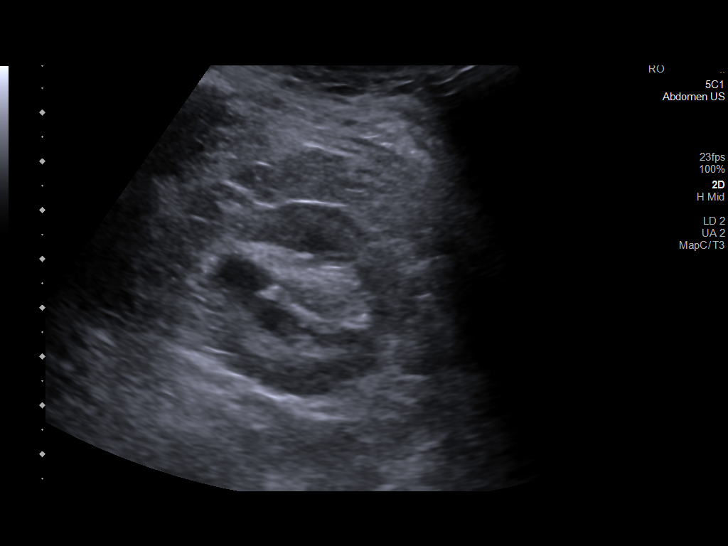
[im 41/50]
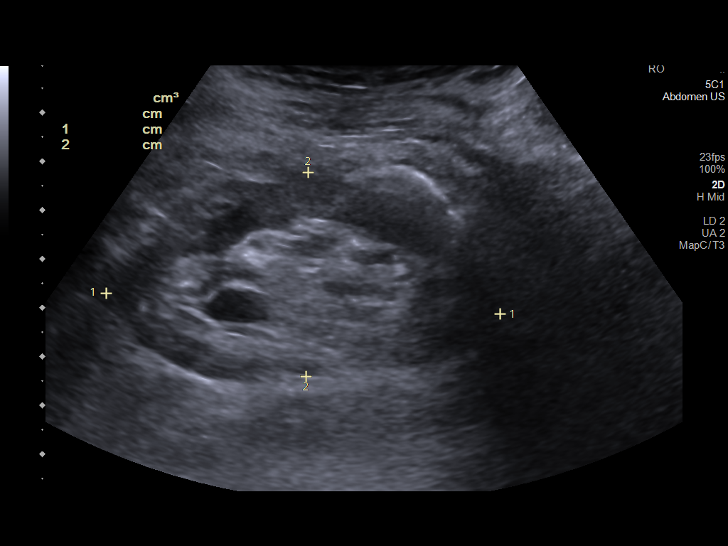
[im 45/50]
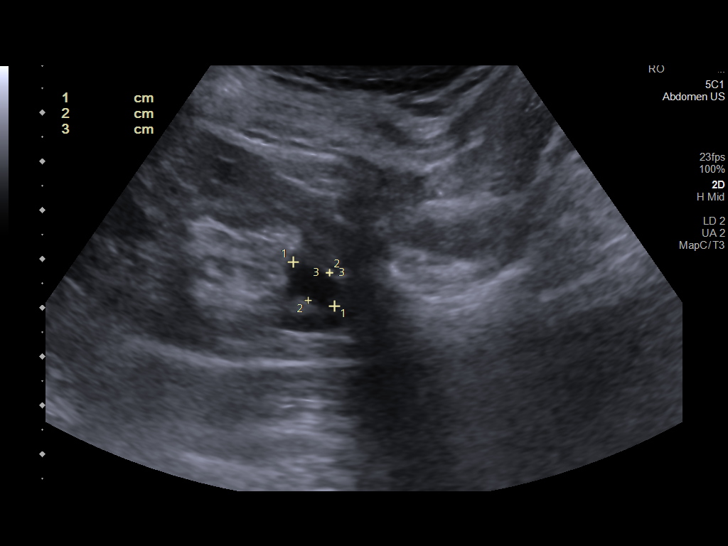
[im 50/50]
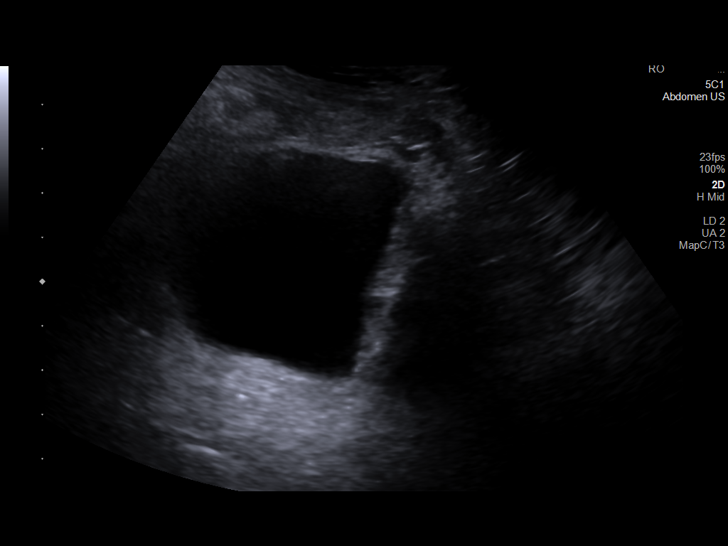

[13 of 25 positions shown; findings below may reference images not displayed]

FINDINGS: Right Kidney:

Renal measurements: 9 x 4.4 x 5.4 cm = volume: 111 mL. Echogenicity
within normal limits. No hydronephrosis. Small cyst at the midpole
measuring 6 mm.

Left Kidney:

Renal measurements: 8.1 x 4.2 x 4.2 cm = volume: 74 mL. Cortex
appears echogenic. No hydronephrosis. Midpole cyst measures 15 by 11
by 2 cm, does not appear to correspond to any of the measured cysts
on the prior exam. The previously noted small 9 mm cortical complex
cyst on the previous exam is also not identified today. There is
however a 3 x 1.9 by 1.4 cm midpole cyst now seen that contains a
few scattered internal echoes.

Bladder:

Appears normal for degree of bladder distention.

Other:

None.
IMPRESSION: 1. Simple appearing right renal cyst.
2. Slightly echogenic left kidney consistent with medical renal
disease. No hydronephrosis
3. Previously noted small complex cortical cyst on the previous exam
left kidney is not clearly visible today. Now seen is a 3 cm
slightly complicated midpole cyst. Continued ultrasound follow-up in
6 months versus characterisation by MRI may be considered.

## 2023-03-06 DIAGNOSIS — D631 Anemia in chronic kidney disease: Secondary | ICD-10-CM | POA: Diagnosis not present

## 2023-03-06 DIAGNOSIS — I129 Hypertensive chronic kidney disease with stage 1 through stage 4 chronic kidney disease, or unspecified chronic kidney disease: Secondary | ICD-10-CM | POA: Diagnosis not present

## 2023-03-06 DIAGNOSIS — N189 Chronic kidney disease, unspecified: Secondary | ICD-10-CM | POA: Diagnosis not present

## 2023-03-06 DIAGNOSIS — R809 Proteinuria, unspecified: Secondary | ICD-10-CM | POA: Diagnosis not present

## 2023-03-12 ENCOUNTER — Other Ambulatory Visit (HOSPITAL_COMMUNITY): Payer: Self-pay | Admitting: Nephrology

## 2023-03-12 DIAGNOSIS — I5032 Chronic diastolic (congestive) heart failure: Secondary | ICD-10-CM

## 2023-03-12 DIAGNOSIS — R809 Proteinuria, unspecified: Secondary | ICD-10-CM | POA: Diagnosis not present

## 2023-03-12 DIAGNOSIS — E875 Hyperkalemia: Secondary | ICD-10-CM | POA: Diagnosis not present

## 2023-03-12 DIAGNOSIS — N1832 Chronic kidney disease, stage 3b: Secondary | ICD-10-CM

## 2023-03-12 DIAGNOSIS — N2581 Secondary hyperparathyroidism of renal origin: Secondary | ICD-10-CM | POA: Diagnosis not present

## 2023-03-12 DIAGNOSIS — I129 Hypertensive chronic kidney disease with stage 1 through stage 4 chronic kidney disease, or unspecified chronic kidney disease: Secondary | ICD-10-CM | POA: Diagnosis not present

## 2023-03-12 DIAGNOSIS — N184 Chronic kidney disease, stage 4 (severe): Secondary | ICD-10-CM

## 2023-03-12 DIAGNOSIS — N179 Acute kidney failure, unspecified: Secondary | ICD-10-CM

## 2023-03-12 DIAGNOSIS — D638 Anemia in other chronic diseases classified elsewhere: Secondary | ICD-10-CM

## 2023-03-25 ENCOUNTER — Ambulatory Visit (HOSPITAL_COMMUNITY)
Admission: RE | Admit: 2023-03-25 | Discharge: 2023-03-25 | Disposition: A | Discharge status: Home / Self Care | Payer: Medicare HMO | Source: Ambulatory Visit | Attending: Nephrology | Admitting: Nephrology

## 2023-03-25 DIAGNOSIS — I5032 Chronic diastolic (congestive) heart failure: Secondary | ICD-10-CM | POA: Diagnosis not present

## 2023-03-25 DIAGNOSIS — N1832 Chronic kidney disease, stage 3b: Secondary | ICD-10-CM | POA: Diagnosis not present

## 2023-03-25 DIAGNOSIS — E875 Hyperkalemia: Secondary | ICD-10-CM | POA: Insufficient documentation

## 2023-03-25 DIAGNOSIS — R809 Proteinuria, unspecified: Secondary | ICD-10-CM | POA: Insufficient documentation

## 2023-03-25 DIAGNOSIS — N183 Chronic kidney disease, stage 3 unspecified: Secondary | ICD-10-CM | POA: Diagnosis not present

## 2023-03-25 DIAGNOSIS — N184 Chronic kidney disease, stage 4 (severe): Secondary | ICD-10-CM | POA: Diagnosis not present

## 2023-03-25 DIAGNOSIS — D638 Anemia in other chronic diseases classified elsewhere: Secondary | ICD-10-CM | POA: Insufficient documentation

## 2023-03-25 DIAGNOSIS — I129 Hypertensive chronic kidney disease with stage 1 through stage 4 chronic kidney disease, or unspecified chronic kidney disease: Secondary | ICD-10-CM | POA: Insufficient documentation

## 2023-03-25 DIAGNOSIS — N281 Cyst of kidney, acquired: Secondary | ICD-10-CM | POA: Diagnosis not present

## 2023-03-25 DIAGNOSIS — N179 Acute kidney failure, unspecified: Secondary | ICD-10-CM | POA: Insufficient documentation

## 2023-04-01 DIAGNOSIS — N1831 Chronic kidney disease, stage 3a: Secondary | ICD-10-CM | POA: Diagnosis not present

## 2023-04-08 DIAGNOSIS — N184 Chronic kidney disease, stage 4 (severe): Secondary | ICD-10-CM | POA: Diagnosis not present

## 2023-04-08 DIAGNOSIS — R809 Proteinuria, unspecified: Secondary | ICD-10-CM | POA: Diagnosis not present

## 2023-04-08 DIAGNOSIS — I129 Hypertensive chronic kidney disease with stage 1 through stage 4 chronic kidney disease, or unspecified chronic kidney disease: Secondary | ICD-10-CM | POA: Diagnosis not present

## 2023-04-08 DIAGNOSIS — E875 Hyperkalemia: Secondary | ICD-10-CM | POA: Diagnosis not present

## 2023-04-12 ENCOUNTER — Ambulatory Visit
Admission: EM | Admit: 2023-04-12 | Discharge: 2023-04-12 | Disposition: A | Discharge status: Home / Self Care | Payer: Medicare HMO

## 2023-04-12 DIAGNOSIS — T148XXA Other injury of unspecified body region, initial encounter: Secondary | ICD-10-CM

## 2023-04-12 DIAGNOSIS — W19XXXA Unspecified fall, initial encounter: Secondary | ICD-10-CM | POA: Diagnosis not present

## 2023-04-12 MED ORDER — MUPIROCIN 2 % EX OINT: 1.0000 | TOPICAL_OINTMENT | Freq: Every day | CUTANEOUS | 0 refills | Status: DC

## 2023-04-12 NOTE — Discharge Instructions (Signed)
Clean the area with a wound solution such as Hibiclens or gentle soap and water, apply the mupirocin ointment that I have sent to the pharmacy and a nonstick gauze or Telfa pad.  Wrapped the area gently in Coban to hold the dressing in place.  Repeat this daily until fully healed.  Follow-up for worsening symptoms.

## 2023-04-12 NOTE — ED Triage Notes (Signed)
Pt reports she fell x 4 days and now has a skin tare on her right forearm.

## 2023-04-12 NOTE — ED Provider Notes (Signed)
[facility]    CSN: [id number] Arrival date & time: 04/12/23  0816      History   Chief Complaint Chief Complaint  Patient presents with   skin tear    HPI Brittany Holden is a 89 y.o. female.   Patient presenting today with a skin tear to the right forearm after a fall 4 days ago.  She states she did not hurt any other areas in the fall and did not hit her head or lose consciousness.  They clean the area at home and bandaged it and she states she has not messed with it since it was bandaged 4 days ago.  Denies significant pain, drainage or bleeding that she is aware of, numbness, tingling, loss of range of motion of the arm.  Tetanus up-to-date, 2023 per chart review.    Past Medical History:  Diagnosis Date   Arrhythmia    Diverticulosis of colon (without mention of hemorrhage)    Dysrhythmia    Hypertension    Hypothyroidism    IBS (irritable bowel syndrome)    Neuropathy    Palpitations    Personal history of colonic polyps 1999 & 2007   hyperplastic     Patient Active Problem List   Diagnosis Date Noted   Chest pain with high risk for cardiac etiology 11/11/2014   Chest pain 11/11/2014   CKD (chronic kidney disease) stage 3, GFR 30-59 ml/min (HCC) 11/11/2014   Essential hypertension 11/11/2014   Hyperlipidemia 11/11/2014    Past Surgical History:  Procedure Laterality Date   ABDOMINAL HYSTERECTOMY     FOOT ARTHRODESIS     pipj digits  2-4 left foot   ORIF PATELLA Left 11/06/2021   Procedure: OPEN REDUCTION INTERNAL (ORIF) FIXATION PATELLA;  Surgeon: Oliver Barre, MD;  Location: [facility];  Service: Orthopedics;  Laterality: Left;   TENDON REPAIR Right 05/18/2021   Procedure: RIGHT RING AND RIGHT SMALL TENDON TRANSFER VS EXTENSOR INDICIS PROPRIUS TO RING AND SMALL FINGER TRANSFER; DISTAL RADIOULNAR JOINT DEBRIDEMENT ULNA  DISTAL RESECTION WITH STABILIZATION;  Surgeon: Betha Loa, MD;  Location: [facility];  Service: Orthopedics;   Laterality: Right;   TONSILLECTOMY  06/26/2012   Procedure: TONSILLECTOMY;  Surgeon: Darletta Moll, MD;  Location: [facility];  Service: ENT;  Laterality: Right;   VAGINAL HYSTERECTOMY      OB History     Gravida  1   Para  1   Term  1   Preterm  0   AB  0   Living  1      SAB  0   IAB  0   Ectopic  0   Multiple      Live Births               Home Medications    Prior to Admission medications   Medication Sig Start Date End Date Taking? Authorizing Provider  calcitRIOL (ROCALTROL) 0.25 MCG capsule Take 0.25 mcg by mouth 2 (two) times a week. 04/08/23  Yes [provider]  hydrochlorothiazide (MICROZIDE) 12.5 MG capsule Take 12.5 mg by mouth every morning. 04/08/23  Yes [provider]  mupirocin ointment (BACTROBAN) 2 % Apply 1 Application topically daily. 04/12/23  Yes Particia Nearing, PA-C  bacitracin ointment Apply 1 Application topically 2 (two) times daily. 12/07/22   Kommor, Madison, MD  chlorthalidone (HYGROTON) 25 MG tablet Take 25 mg by mouth every other day. 11/02/20 05/05/23  [provider]  Cholecalciferol  25 MCG (1000 UT) tablet Take 400 Units by mouth daily.    [provider]  Cyanocobalamin (VITAMIN B-12 CR PO) Take by mouth. Patient not taking: Reported on 05/04/2022    [provider]  levothyroxine (SYNTHROID, LEVOTHROID) 75 MCG tablet Take 75 mcg by mouth daily.      [provider]  lidocaine (LIDODERM) 5 % Place 1 patch onto the skin daily. Remove & Discard patch within 12 hours or as directed by MD 08/20/22   Sherian Maroon A, PA  metoprolol succinate (TOPROL XL) 25 MG 24 hr tablet Take 1 tablet (25 mg total) by mouth daily. 04/20/20   Pricilla Riffle, MD  Multiple Minerals-Vitamins (CALCIUM & VIT D3 BONE HEALTH PO) Take 1 tablet by mouth daily. Patient not taking: Reported on 05/04/2022    [provider]  predniSONE (DELTASONE) 10 MG tablet Take 4 tablets (40 mg total) by mouth daily.  08/20/22   Peter Garter, PA  simvastatin (ZOCOR) 20 MG tablet Take 20 mg by mouth every morning.     [provider]  sodium zirconium cyclosilicate (LOKELMA) 5 g packet Take 5 g by mouth daily.    [provider]    Family History Family History  Problem Relation Age of Onset   CAD Neg Hx    Breast cancer Neg Hx     Social History Social History   Tobacco Use   Smoking status: Never   Smokeless tobacco: Never  Vaping Use   Vaping status: Never Used  Substance Use Topics   Alcohol use: No   Drug use: No     Allergies   Patient has no known allergies.   Review of Systems Review of Systems Per HPI  Physical Exam Triage Vital Signs ED Triage Vitals  Encounter Vitals Group     BP 04/12/23 0820 (!) 152/87     Systolic BP Percentile --      Diastolic BP Percentile --      Pulse Rate 04/12/23 0820 78     Resp 04/12/23 0820 20     Temp 04/12/23 0820 (!) 97.3 F (36.3 C)     Temp Source 04/12/23 0820 Oral     SpO2 04/12/23 0820 96 %     Weight --      Height --      Head Circumference --      Peak Flow --      Pain Score 04/12/23 0822 0     Pain Loc --      Pain Education --      Exclude from Growth Chart --    No data found.  Updated Vital Signs BP (!) 152/87 (BP Location: Right Arm)   Pulse 78   Temp (!) 97.3 F (36.3 C) (Oral)   Resp 20   SpO2 96%   Visual Acuity Right Eye Distance:   Left Eye Distance:   Bilateral Distance:    Right Eye Near:   Left Eye Near:    Bilateral Near:     Physical Exam Vitals and nursing note reviewed.  Constitutional:      Appearance: Normal appearance. She is not ill-appearing.  HENT:     Head: Atraumatic.  Eyes:     Extraocular Movements: Extraocular movements intact.     Conjunctiva/sclera: Conjunctivae normal.  Cardiovascular:     Rate and Rhythm: Normal rate.  Pulmonary:     Effort: Pulmonary effort is normal.  Musculoskeletal:  General: Normal range of motion.      Cervical back: Normal range of motion and neck supple.  Skin:    General: Skin is warm.     Comments: Superficial skin avulsion to the right forearm, partially reapproximated flap.  Surrounding bruising, no bleeding or drainage and no erythema or edema  Neurological:     Mental Status: She is alert. Mental status is at baseline.     Comments: Right upper extremity neurovascularly intact  Psychiatric:        Mood and Affect: Mood normal.        Thought Content: Thought content normal.        Judgment: Judgment normal.      UC Treatments / Results  Labs (all labs ordered are listed, but only abnormal results are displayed) Labs Reviewed - No data to display  EKG   Radiology No results found.  Procedures Procedures (including critical care time)  Medications Ordered in UC Medications - No data to display  Initial Impression / Assessment and Plan / UC Course  I have reviewed the triage vital signs and the nursing notes.  Pertinent labs & imaging results that were available during my care of the patient were reviewed by me and considered in my medical decision making (see chart for details).     Wound is healing well without complication.  No evidence of bacterial infection.  Area cleaned and redressed today and discussed home dressing changes daily with mupirocin ointment and nonstick dressing.  Tetanus up-to-date.  No other areas of concern today.  Return for worsening symptoms.  Final Clinical Impressions(s) / UC Diagnoses   Final diagnoses:  Avulsion of skin  Fall, initial encounter     Discharge Instructions      Clean the area with a wound solution such as Hibiclens or gentle soap and water, apply the mupirocin ointment that I have sent to the pharmacy and a nonstick gauze or Telfa pad.  Wrapped the area gently in Coban to hold the dressing in place.  Repeat this daily until fully healed.  Follow-up for worsening symptoms.    ED Prescriptions     Medication  Sig Dispense Auth. Provider   mupirocin ointment (BACTROBAN) 2 % Apply 1 Application topically daily. 60 g Particia Nearing, [place]      PDMP not reviewed this encounter.   Roosvelt Maser [place], [place] 04/12/23 [phone]

## 2023-05-15 DIAGNOSIS — E782 Mixed hyperlipidemia: Secondary | ICD-10-CM | POA: Diagnosis not present

## 2023-05-15 DIAGNOSIS — R7303 Prediabetes: Secondary | ICD-10-CM | POA: Diagnosis not present

## 2023-05-15 DIAGNOSIS — E039 Hypothyroidism, unspecified: Secondary | ICD-10-CM | POA: Diagnosis not present

## 2023-05-21 DIAGNOSIS — Z23 Encounter for immunization: Secondary | ICD-10-CM | POA: Diagnosis not present

## 2023-05-21 DIAGNOSIS — M2550 Pain in unspecified joint: Secondary | ICD-10-CM | POA: Diagnosis not present

## 2023-05-21 DIAGNOSIS — N1832 Chronic kidney disease, stage 3b: Secondary | ICD-10-CM | POA: Diagnosis not present

## 2023-05-21 DIAGNOSIS — I10 Essential (primary) hypertension: Secondary | ICD-10-CM | POA: Diagnosis not present

## 2023-05-21 DIAGNOSIS — R269 Unspecified abnormalities of gait and mobility: Secondary | ICD-10-CM | POA: Diagnosis not present

## 2023-05-21 DIAGNOSIS — I129 Hypertensive chronic kidney disease with stage 1 through stage 4 chronic kidney disease, or unspecified chronic kidney disease: Secondary | ICD-10-CM | POA: Diagnosis not present

## 2023-05-21 DIAGNOSIS — E875 Hyperkalemia: Secondary | ICD-10-CM | POA: Diagnosis not present

## 2023-05-21 DIAGNOSIS — M5442 Lumbago with sciatica, left side: Secondary | ICD-10-CM | POA: Diagnosis not present

## 2023-06-10 IMAGING — DX DG HIP (WITH OR WITHOUT PELVIS) 2-3V*R*
3 series · 3 of 3 positions shown · non-contrast
Comparison: None.

CLINICAL DATA: Right hip pain, fall [DATE]

EXAM:
DG HIP (WITH OR WITHOUT PELVIS) 2-3V RIGHT

[pelvis ap]
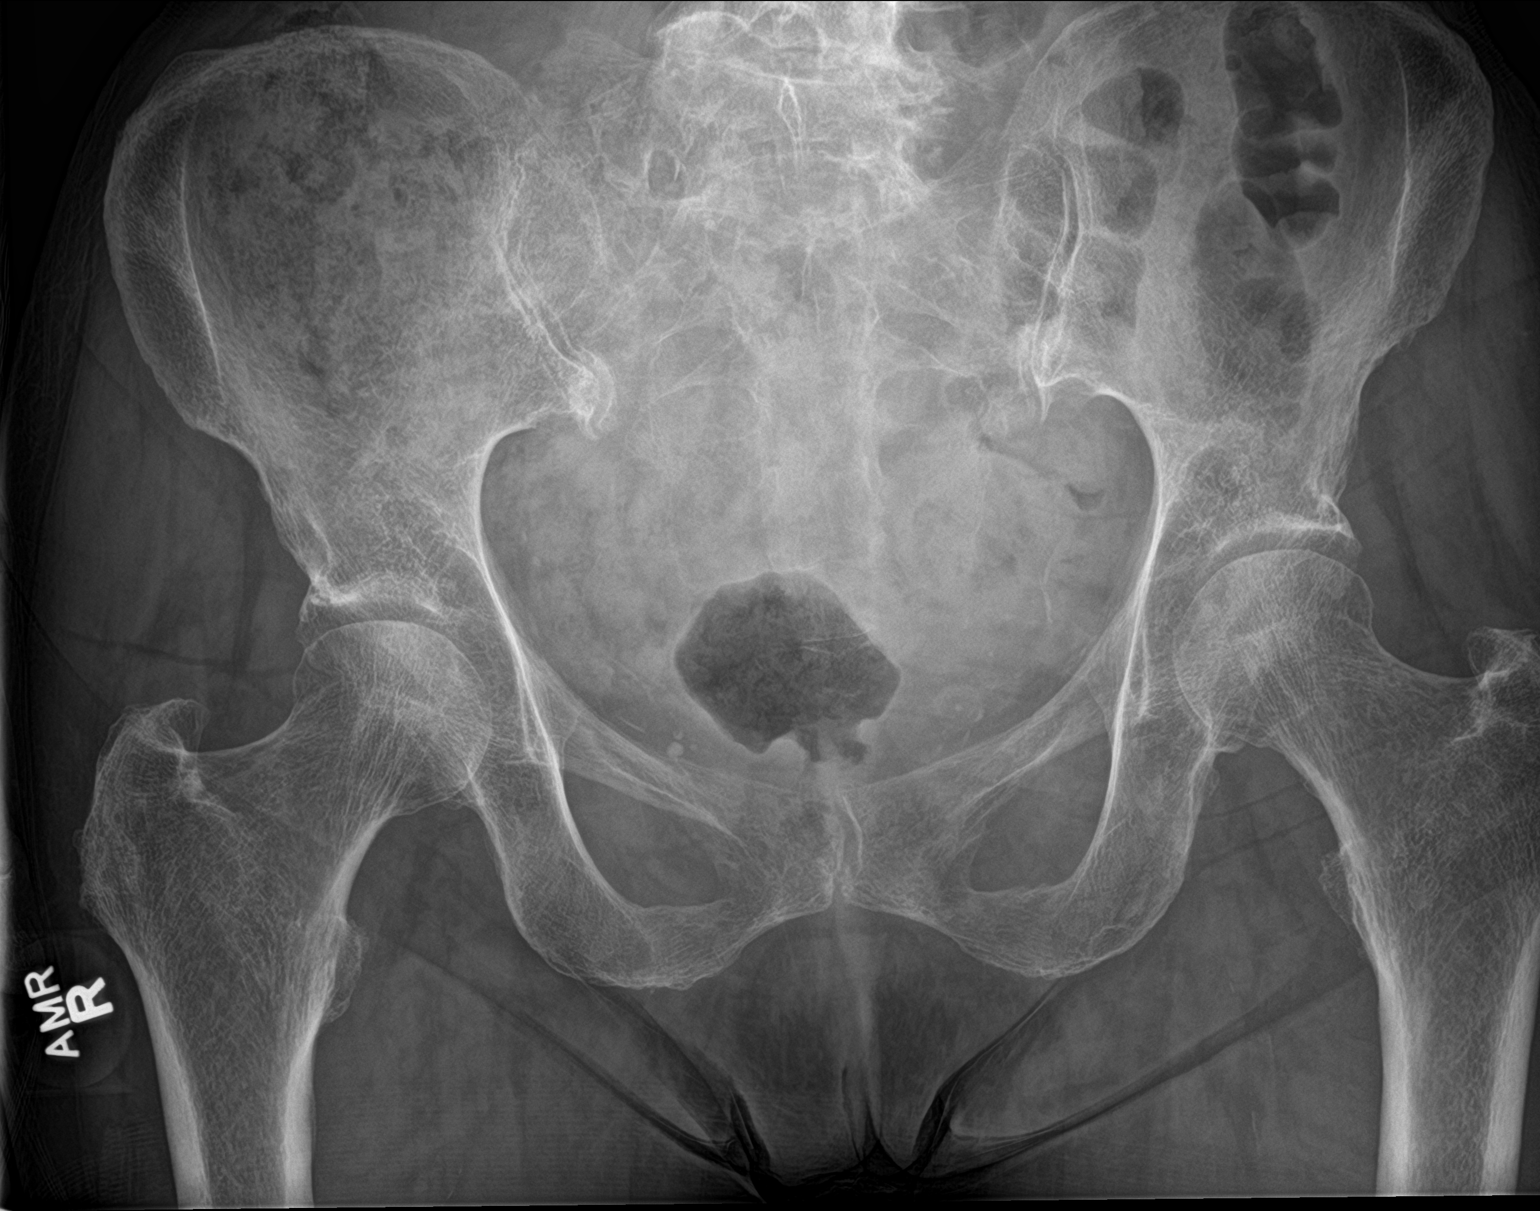

[hip ap]
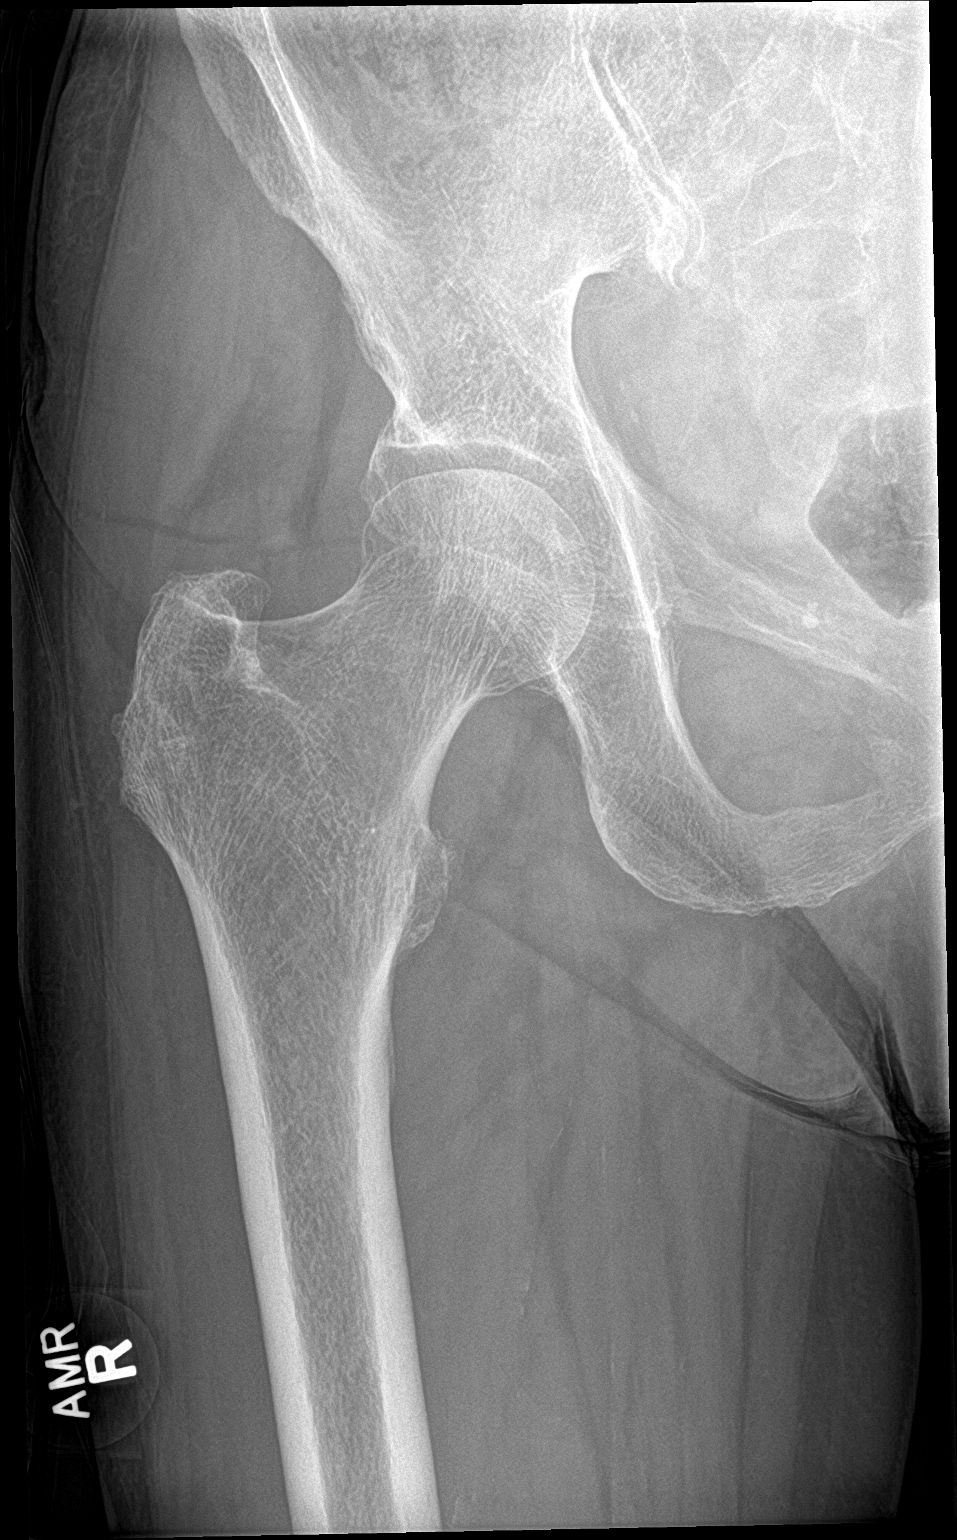

[hip frog leg]
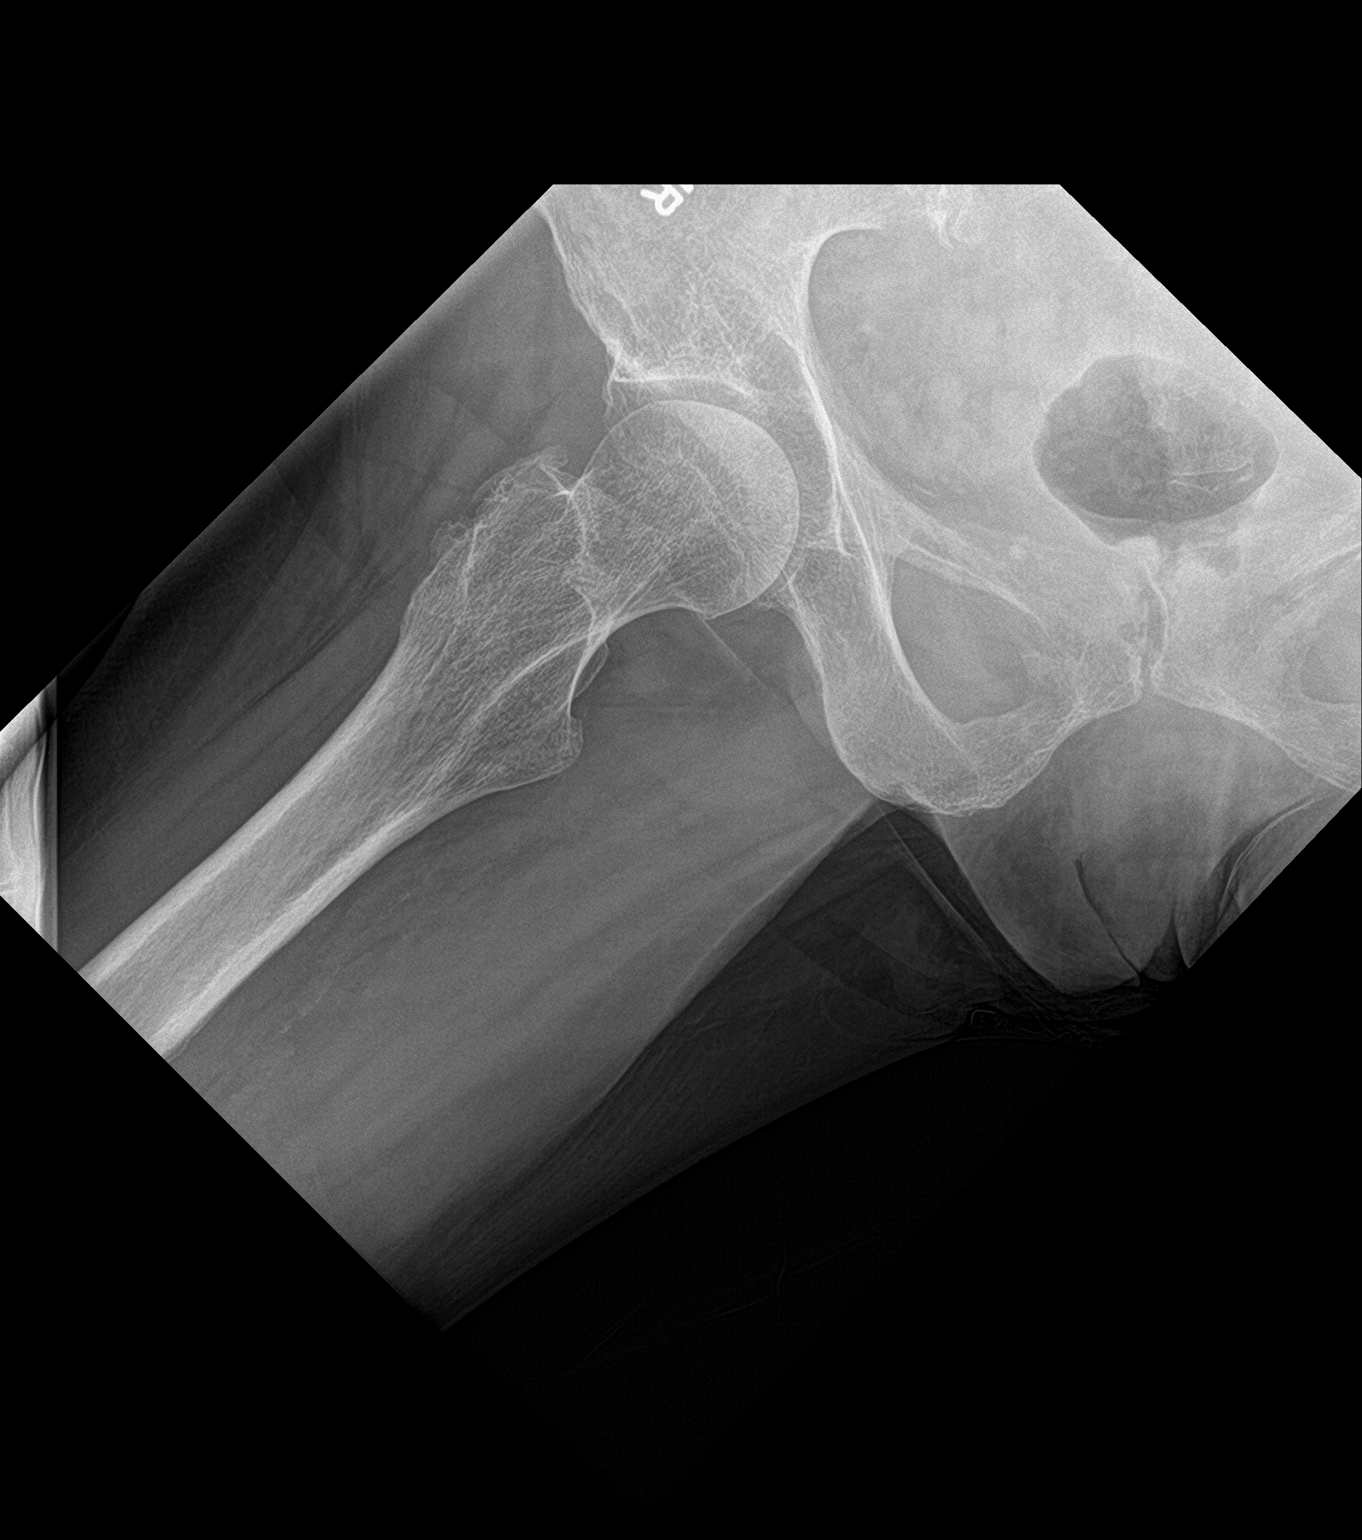

[3 of 3 positions shown; findings below may reference images not displayed]

FINDINGS: Osteopenia. No displaced fracture or dislocation of the right hip,
or the pelvis or proximal left femur seen in frontal view only.
Nonobstructive pattern of overlying bowel gas.
IMPRESSION: No displaced fracture or dislocation of the right hip, or the pelvis
or proximal left femur seen in frontal view only. Please note that
plain radiographs are significantly insensitive for hip and pelvic
fracture in the setting of osteopenia. Consider CT or MRI to most
sensitively assess for fracture if there is high clinical suspicion.

## 2023-07-04 DIAGNOSIS — D649 Anemia, unspecified: Secondary | ICD-10-CM | POA: Diagnosis not present

## 2023-07-04 DIAGNOSIS — N1832 Chronic kidney disease, stage 3b: Secondary | ICD-10-CM | POA: Diagnosis not present

## 2023-07-04 DIAGNOSIS — R419 Unspecified symptoms and signs involving cognitive functions and awareness: Secondary | ICD-10-CM | POA: Diagnosis not present

## 2023-07-04 DIAGNOSIS — R4189 Other symptoms and signs involving cognitive functions and awareness: Secondary | ICD-10-CM | POA: Diagnosis not present

## 2023-07-18 DIAGNOSIS — E875 Hyperkalemia: Secondary | ICD-10-CM | POA: Diagnosis not present

## 2023-07-18 DIAGNOSIS — R809 Proteinuria, unspecified: Secondary | ICD-10-CM | POA: Diagnosis not present

## 2023-07-18 DIAGNOSIS — I5032 Chronic diastolic (congestive) heart failure: Secondary | ICD-10-CM | POA: Diagnosis not present

## 2023-07-18 DIAGNOSIS — I129 Hypertensive chronic kidney disease with stage 1 through stage 4 chronic kidney disease, or unspecified chronic kidney disease: Secondary | ICD-10-CM | POA: Diagnosis not present

## 2023-07-24 ENCOUNTER — Other Ambulatory Visit (HOSPITAL_COMMUNITY): Payer: Self-pay | Admitting: Nurse Practitioner

## 2023-07-24 ENCOUNTER — Ambulatory Visit (HOSPITAL_COMMUNITY)
Admission: RE | Admit: 2023-07-24 | Discharge: 2023-07-24 | Disposition: A | Discharge status: Home / Self Care | Payer: Medicare HMO | Source: Ambulatory Visit | Attending: Nurse Practitioner | Admitting: Nurse Practitioner

## 2023-07-24 DIAGNOSIS — S91109A Unspecified open wound of unspecified toe(s) without damage to nail, initial encounter: Secondary | ICD-10-CM | POA: Diagnosis not present

## 2023-07-24 DIAGNOSIS — M2041 Other hammer toe(s) (acquired), right foot: Secondary | ICD-10-CM | POA: Diagnosis not present

## 2023-07-24 DIAGNOSIS — M7731 Calcaneal spur, right foot: Secondary | ICD-10-CM | POA: Diagnosis not present

## 2023-07-24 DIAGNOSIS — X58XXXA Exposure to other specified factors, initial encounter: Secondary | ICD-10-CM | POA: Diagnosis not present

## 2023-07-24 DIAGNOSIS — S91101A Unspecified open wound of right great toe without damage to nail, initial encounter: Secondary | ICD-10-CM | POA: Diagnosis not present

## 2023-07-24 DIAGNOSIS — R4189 Other symptoms and signs involving cognitive functions and awareness: Secondary | ICD-10-CM | POA: Diagnosis not present

## 2023-07-24 DIAGNOSIS — R419 Unspecified symptoms and signs involving cognitive functions and awareness: Secondary | ICD-10-CM | POA: Diagnosis not present

## 2023-07-24 DIAGNOSIS — S91104A Unspecified open wound of right lesser toe(s) without damage to nail, initial encounter: Secondary | ICD-10-CM | POA: Diagnosis not present

## 2023-07-24 DIAGNOSIS — S92331A Displaced fracture of third metatarsal bone, right foot, initial encounter for closed fracture: Secondary | ICD-10-CM | POA: Diagnosis not present

## 2023-07-24 DIAGNOSIS — M19071 Primary osteoarthritis, right ankle and foot: Secondary | ICD-10-CM | POA: Diagnosis not present

## 2023-09-13 ENCOUNTER — Emergency Department (HOSPITAL_COMMUNITY)
Admission: EM | Admit: 2023-09-13 | Discharge: 2023-09-13 | Disposition: A | Discharge status: Home / Self Care | Payer: Medicare HMO | Attending: Emergency Medicine | Admitting: Emergency Medicine

## 2023-09-13 ENCOUNTER — Other Ambulatory Visit: Payer: Self-pay

## 2023-09-13 ENCOUNTER — Emergency Department (HOSPITAL_COMMUNITY): Payer: Medicare HMO

## 2023-09-13 ENCOUNTER — Encounter (HOSPITAL_COMMUNITY): Payer: Self-pay | Admitting: *Deleted

## 2023-09-13 DIAGNOSIS — Y92008 Other place in unspecified non-institutional (private) residence as the place of occurrence of the external cause: Secondary | ICD-10-CM | POA: Diagnosis not present

## 2023-09-13 DIAGNOSIS — Z23 Encounter for immunization: Secondary | ICD-10-CM | POA: Insufficient documentation

## 2023-09-13 DIAGNOSIS — Z043 Encounter for examination and observation following other accident: Secondary | ICD-10-CM | POA: Diagnosis not present

## 2023-09-13 DIAGNOSIS — S0101XA Laceration without foreign body of scalp, initial encounter: Secondary | ICD-10-CM | POA: Insufficient documentation

## 2023-09-13 DIAGNOSIS — R079 Chest pain, unspecified: Secondary | ICD-10-CM | POA: Diagnosis not present

## 2023-09-13 DIAGNOSIS — S199XXA Unspecified injury of neck, initial encounter: Secondary | ICD-10-CM | POA: Diagnosis not present

## 2023-09-13 DIAGNOSIS — W19XXXA Unspecified fall, initial encounter: Secondary | ICD-10-CM | POA: Diagnosis not present

## 2023-09-13 DIAGNOSIS — Z79899 Other long term (current) drug therapy: Secondary | ICD-10-CM | POA: Insufficient documentation

## 2023-09-13 DIAGNOSIS — S0990XA Unspecified injury of head, initial encounter: Secondary | ICD-10-CM | POA: Diagnosis not present

## 2023-09-13 DIAGNOSIS — M160 Bilateral primary osteoarthritis of hip: Secondary | ICD-10-CM | POA: Diagnosis not present

## 2023-09-13 MED ORDER — TETANUS-DIPHTH-ACELL PERTUSSIS 5-2.5-18.5 LF-MCG/0.5 IM SUSY
0.5000 mL | PREFILLED_SYRINGE | Freq: Once | INTRAMUSCULAR | Status: AC
  Administered 2023-09-13: 0.5 mL via INTRAMUSCULAR
  Filled 2023-09-13: qty 0.5

## 2023-09-13 NOTE — ED Triage Notes (Signed)
 Pt fell off the porch on Wednesday on the steps and ground.  Rt shoulder and rt side with pain, denies hitting her head but dried blood noted to scalp.  Denies any blood thinners

## 2023-09-13 NOTE — Discharge Instructions (Addendum)
 It was a pleasure taking care of you today.  You were evaluated in the emergency room following a mechanical fall.  Your imaging did not show any acute abnormality. Of note your tonsils were asymmetric on CT scan, please follow up with your primary care doctor to discuss any additional imaging to ensure this is benign. You had a small laceration on your scalp, this was stapled closed.  Please return to the emergency room in 10 days for staple removal.  You were given an updated tetanus shot.  If you experience any new or worsening symptoms including blurry vision, dizziness, weakness, difficulty ambulating please return to the emergency room.

## 2023-09-13 NOTE — ED Provider Notes (Signed)
 [place] EMERGENCY DEPARTMENT AT [facility] Provider Note   CSN: [id number] Arrival date & time: 09/13/23  1010     History  Chief Complaint  Patient presents with   Brittany Holden is a 89 y.o. female who presents following mechanical fall this past Wednesday on concrete.  By her son who notes that she has been intermittently complaining of back pain worse with inhalation and noted some blood in her hair.  Patient is not on blood thinners.  She has been reportedly behaving at baseline.  She denies any extremity weakness, blurry vision, headache, chest pain, shortness of breath.  Patient denies any current pain.   Fall       Home Medications Prior to Admission medications   Medication Sig Start Date End Date Taking? Authorizing Provider  bacitracin  ointment Apply 1 Application topically 2 (two) times daily. 12/07/22   Kommor, Madison, MD  calcitRIOL (ROCALTROL) 0.25 MCG capsule Take 0.25 mcg by mouth 2 (two) times a week. 04/08/23   [provider]  chlorthalidone (HYGROTON) 25 MG tablet Take 25 mg by mouth every other day. 11/02/20 05/05/23  [provider]  Cholecalciferol 25 MCG (1000 UT) tablet Take 400 Units by mouth daily.    [provider]  Cyanocobalamin  (VITAMIN B-12 CR PO) Take by mouth. Patient not taking: Reported on 05/04/2022    [provider]  donepezil (ARICEPT) 10 MG tablet Take 10 mg by mouth daily. 08/30/23   [provider]  hydrochlorothiazide  (MICROZIDE ) 12.5 MG capsule Take 12.5 mg by mouth every morning. 04/08/23   [provider]  levothyroxine  (SYNTHROID , LEVOTHROID) 75 MCG tablet Take 75 mcg by mouth daily.      [provider]  lidocaine  (LIDODERM ) 5 % Place 1 patch onto the skin daily. Remove & Discard patch within 12 hours or as directed by MD 08/20/22   Silver Wonda LABOR, PA  metoprolol  succinate (TOPROL  XL) 25 MG 24 hr tablet Take 1 tablet (25 mg total) by mouth daily. 04/20/20    Okey Vina GAILS, MD  Multiple Minerals-Vitamins (CALCIUM & VIT D3 BONE HEALTH PO) Take 1 tablet by mouth daily. Patient not taking: Reported on 05/04/2022    [provider]  mupirocin  ointment (BACTROBAN ) 2 % Apply 1 Application topically daily. 04/12/23   Stuart Vernell Norris, PA-C  predniSONE  (DELTASONE ) 10 MG tablet Take 4 tablets (40 mg total) by mouth daily. 08/20/22   Silver Wonda LABOR, PA  simvastatin  (ZOCOR ) 20 MG tablet Take 20 mg by mouth every morning.     [provider]  sodium zirconium cyclosilicate (LOKELMA) 5 g packet Take 5 g by mouth daily.    [provider]      Allergies    Patient has no known allergies.    Review of Systems   Review of Systems  Musculoskeletal:  Positive for arthralgias and myalgias.    Physical Exam Updated Vital Signs BP (!) 156/104 (BP Location: Right Arm)   Pulse 87   Temp 97.6 F (36.4 C) (Oral)   Resp 18   Ht 6' (1.829 m)   Wt 59 kg   SpO2 96%   BMI 17.63 kg/m  Physical Exam Vitals and nursing note reviewed.  Constitutional:      General: She is not in acute distress.    Appearance: She is well-developed.  HENT:     Head: No raccoon eyes or Battle's sign.     Comments: Dried blood along  scalp vertex, no obvious deep laceration or step-off, no hemotympanum Eyes:     Conjunctiva/sclera: Conjunctivae normal.  Cardiovascular:     Rate and Rhythm: Normal rate and regular rhythm.     Comments: Known systolic murmur Pulmonary:     Effort: Pulmonary effort is normal. No respiratory distress.     Breath sounds: Normal breath sounds.  Abdominal:     Palpations: Abdomen is soft.     Tenderness: There is no abdominal tenderness.  Musculoskeletal:        General: No swelling.     Cervical back: Neck supple.     Comments: No point of tenderness, no rib instability, no ecchymosis or swelling, moves all extremities spontaneously, no midline tenderness  Skin:    General: Skin is warm and dry.     Capillary  Refill: Capillary refill takes less than 2 seconds.  Neurological:     General: No focal deficit present.     Mental Status: She is alert and oriented to person, place, and time.     Cranial Nerves: No cranial nerve deficit.     Sensory: No sensory deficit.     Motor: No weakness.     Coordination: Coordination normal.     Comments: No facial droop or abnormal phonation  Psychiatric:        Mood and Affect: Mood normal.     ED Results / Procedures / Treatments   Labs (all labs ordered are listed, but only abnormal results are displayed) Labs Reviewed - No data to display  EKG None  Radiology DG Chest 2 View Result Date: 09/13/2023 CLINICAL DATA:  Pain after fall EXAM: CHEST - 2 VIEW COMPARISON:  X-ray 12/30/2022. FINDINGS: Hyperinflation. Chronic lung changes. No consolidation, pneumothorax or effusion. No edema. Normal cardiopericardial silhouette. There are some compression deformities along the mid and lower thoracic spine at multiple levels. These are not seen on the x-ray of 2020. These are appear to be increased from the study of April 2024. IMPRESSION: Hyperinflation with chronic lung changes. No pneumothorax or effusion. Increasing compression deformities along the thoracic spine at multiple levels compared to available prior exams. These are of uncertain age and etiology but could be acute. Please correlate with symptoms and if needed dedicated thoracic spine CT to further delineate as clinically appropriate Electronically Signed   By: Ranell Bring M.D.   On: 09/13/2023 13:08   DG Pelvis 1-2 Views Result Date: 09/13/2023 CLINICAL DATA:  Fall. EXAM: PELVIS - 1-2 VIEW COMPARISON:  None Available. FINDINGS: Pelvis is intact with normal and symmetric sacroiliac joints. No acute displaced fracture. No dislocation. No aggressive osseous lesion. Visualized sacral arcuate lines are unremarkable. Unremarkable symphysis pubis. There are mild degenerative changes of bilateral hip joints  without significant joint space narrowing. Osteophytosis of the superior acetabulum. No radiopaque foreign bodies. IMPRESSION: *No acute displaced fracture of the pelvis. Consider additional imaging, as clinically indicated. Electronically Signed   By: Ree Molt M.D.   On: 09/13/2023 13:08   CT Head Wo Contrast Result Date: 09/13/2023 CLINICAL DATA:  Polytrauma, blunt EXAM: CT HEAD WITHOUT CONTRAST CT CERVICAL SPINE WITHOUT CONTRAST TECHNIQUE: Multidetector CT imaging of the head and cervical spine was performed following the standard protocol without intravenous contrast. Multiplanar CT image reconstructions of the cervical spine were also generated. RADIATION DOSE REDUCTION: This exam was performed according to the departmental dose-optimization program which includes automated exposure control, adjustment of the mA and/or kV according to patient size and/or use of iterative reconstruction  technique. COMPARISON:  None Available. FINDINGS: CT HEAD FINDINGS Brain: No hemorrhage. No hydrocephalus. No extra-axial fluid collection. No CT evidence of an acute cortical infarct. No mass effect. No mass lesion. Vascular: No hyperdense vessel or unexpected calcification. Skull: Normal. Negative for fracture or focal lesion. Sinuses/Orbits: No middle ear or mastoid effusion. Paranasal sinuses are notable for complete opacification of the left maxillary sinus. Orbits are unremarkable. Other: None. CT CERVICAL SPINE FINDINGS Alignment: Trace anterolisthesis of C7 on T1. Skull base and vertebrae: No acute fracture. No primary bone lesion or focal pathologic process. Soft tissues and spinal canal: No CT evidence of high-grade spinal canal stenosis. Disc levels:  No CT evidence of high-grade spinal canal stenosis. Upper chest: Negative. Other: Asymmetric enlargement of the palatine tonsils on the left (series 4, image 46). Recommend correlation with direct visualization to exclude the possibility of an underlying mass.  IMPRESSION: 1. No CT evidence of intracranial injury. 2. No acute fracture or traumatic subluxation of the cervical spine. 3. Asymmetric enlargement of the palatine tonsils on the left. Recommend correlation with direct visualization to exclude the possibility of an underlying mass. Electronically Signed   By: Lyndall Gore M.D.   On: 09/13/2023 12:46   CT Cervical Spine Wo Contrast Result Date: 09/13/2023 CLINICAL DATA:  Polytrauma, blunt EXAM: CT HEAD WITHOUT CONTRAST CT CERVICAL SPINE WITHOUT CONTRAST TECHNIQUE: Multidetector CT imaging of the head and cervical spine was performed following the standard protocol without intravenous contrast. Multiplanar CT image reconstructions of the cervical spine were also generated. RADIATION DOSE REDUCTION: This exam was performed according to the departmental dose-optimization program which includes automated exposure control, adjustment of the mA and/or kV according to patient size and/or use of iterative reconstruction technique. COMPARISON:  None Available. FINDINGS: CT HEAD FINDINGS Brain: No hemorrhage. No hydrocephalus. No extra-axial fluid collection. No CT evidence of an acute cortical infarct. No mass effect. No mass lesion. Vascular: No hyperdense vessel or unexpected calcification. Skull: Normal. Negative for fracture or focal lesion. Sinuses/Orbits: No middle ear or mastoid effusion. Paranasal sinuses are notable for complete opacification of the left maxillary sinus. Orbits are unremarkable. Other: None. CT CERVICAL SPINE FINDINGS Alignment: Trace anterolisthesis of C7 on T1. Skull base and vertebrae: No acute fracture. No primary bone lesion or focal pathologic process. Soft tissues and spinal canal: No CT evidence of high-grade spinal canal stenosis. Disc levels:  No CT evidence of high-grade spinal canal stenosis. Upper chest: Negative. Other: Asymmetric enlargement of the palatine tonsils on the left (series 4, image 46). Recommend correlation with  direct visualization to exclude the possibility of an underlying mass. IMPRESSION: 1. No CT evidence of intracranial injury. 2. No acute fracture or traumatic subluxation of the cervical spine. 3. Asymmetric enlargement of the palatine tonsils on the left. Recommend correlation with direct visualization to exclude the possibility of an underlying mass. Electronically Signed   By: Lyndall Gore M.D.   On: 09/13/2023 12:46    Procedures .Laceration Repair  Date/Time: 09/13/2023 3:41 PM  Performed by: Donnajean Lynwood DEL, PA-C Authorized by: Donnajean Lynwood DEL, PA-C   Consent:    Consent obtained:  Verbal   Consent given by:  Patient   Risks, benefits, and alternatives were discussed: yes     Risks discussed:  Pain   Alternatives discussed:  No treatment Universal protocol:    Procedure explained and questions answered to patient or proxy's satisfaction: yes     Patient identity confirmed:  Verbally with patient Anesthesia:  Anesthesia method:  None Laceration details:    Location:  Scalp   Scalp location:  Occipital   Length (cm):  1 Treatment:    Area cleansed with:  Povidone-iodine and saline Skin repair:    Repair method:  Staples   Number of staples:  1 Post-procedure details:    Procedure completion:  Tolerated     Medications Ordered in ED Medications  Tdap (BOOSTRIX ) injection 0.5 mL (has no administration in time range)    ED Course/ Medical Decision Making/ A&P                                 Medical Decision Making Amount and/or Complexity of Data Reviewed Radiology: ordered.   This patient presents to the ED with chief complaint(s) of mechanical fall.  The complaint involves an extensive differential diagnosis and also carries with it a high risk of complications and morbidity.   pertinent past medical history as listed in HPI  The differential diagnosis includes  Intracranial hemorrhage, fracture, sprain, contusion, dislocation The initial plan is to   Will start with CT head and neck, chest x-ray and at pelvis x-ray Additional history obtained: Additional history obtained from family Records reviewed Care Everywhere/External Records  Initial Assessment:   Nontoxic-appearing patient presenting days later following mechanical fall.  No focal tenderness, neuroexam without any focal deficit.  Appears to be a somewhat poor historian.  She is without any complaints currently however her son states she has been intermittently complaining of back pain.  Independent ECG interpretation:  none  Independent labs interpretation:  The following labs were independently interpreted:  none  Independent visualization and interpretation of imaging: I independently visualized the following imaging with scope of interpretation limited to determining acute life threatening conditions related to emergency care: CT head, neck, chest x-ray, pelvis x-ray, which revealed no acute abnormality.  Events of old compression fractures  Treatment and Reassessment: No medications administered during visit.  Following irrigation of patient's wound, there does appear to be a very superficial 1 cm laceration on her occiput with mild bleeding.  Will stable this closed.  Otherwise there is very superficial surrounding abrasion.  Offered something for pain.  Patient declines.  Given Tetanus booster.  Patient lives alone but has good family support.  Discussed discharge plan with son and patient they are in agreement.  Consultations obtained:   none  Disposition:   Patient discharged home.  Will return in 10 days for staple removal. The patient has been appropriately medically screened and/or stabilized in the ED. I have low suspicion for any other emergent medical condition which would require further screening, evaluation or treatment in the ED or require inpatient management. At time of discharge the patient is hemodynamically stable and in no acute distress. I have  discussed work-up results and diagnosis with patient and answered all questions. Patient is agreeable with discharge plan. We discussed strict return precautions for returning to the emergency department and they verbalized understanding.     Social Determinants of Health:   none  This note was dictated with voice recognition software.  Despite best efforts at proofreading, errors may have occurred which can change the documentation meaning.          Final Clinical Impression(s) / ED Diagnoses Final diagnoses:  Fall, initial encounter    Rx / DC Orders ED Discharge Orders     None  Donnajean Lynwood DEL, PA-C 09/13/23 1544    Suzette Pac, MD 09/16/23 1152

## 2023-09-26 DIAGNOSIS — W19XXXD Unspecified fall, subsequent encounter: Secondary | ICD-10-CM | POA: Diagnosis not present

## 2023-09-26 DIAGNOSIS — M549 Dorsalgia, unspecified: Secondary | ICD-10-CM | POA: Diagnosis not present

## 2023-09-26 DIAGNOSIS — E039 Hypothyroidism, unspecified: Secondary | ICD-10-CM | POA: Diagnosis not present

## 2023-09-26 DIAGNOSIS — S0191XD Laceration without foreign body of unspecified part of head, subsequent encounter: Secondary | ICD-10-CM | POA: Diagnosis not present

## 2023-09-26 DIAGNOSIS — Z4802 Encounter for removal of sutures: Secondary | ICD-10-CM | POA: Diagnosis not present

## 2023-09-26 DIAGNOSIS — E559 Vitamin D deficiency, unspecified: Secondary | ICD-10-CM | POA: Diagnosis not present

## 2023-09-26 DIAGNOSIS — R7303 Prediabetes: Secondary | ICD-10-CM | POA: Diagnosis not present

## 2023-09-26 DIAGNOSIS — E782 Mixed hyperlipidemia: Secondary | ICD-10-CM | POA: Diagnosis not present

## 2023-09-26 DIAGNOSIS — Z7689 Persons encountering health services in other specified circumstances: Secondary | ICD-10-CM | POA: Diagnosis not present

## 2023-10-01 ENCOUNTER — Emergency Department (HOSPITAL_COMMUNITY): Payer: Medicare HMO

## 2023-10-01 ENCOUNTER — Encounter (HOSPITAL_COMMUNITY): Payer: Self-pay | Admitting: *Deleted

## 2023-10-01 ENCOUNTER — Other Ambulatory Visit: Payer: Self-pay

## 2023-10-01 ENCOUNTER — Emergency Department (HOSPITAL_COMMUNITY)
Admission: EM | Admit: 2023-10-01 | Discharge: 2023-10-09 | Disposition: A | Discharge status: Skilled Nursing Facility | Payer: Medicare HMO | Attending: Emergency Medicine | Admitting: Emergency Medicine

## 2023-10-01 DIAGNOSIS — F03918 Unspecified dementia, unspecified severity, with other behavioral disturbance: Secondary | ICD-10-CM | POA: Insufficient documentation

## 2023-10-01 DIAGNOSIS — R29818 Other symptoms and signs involving the nervous system: Secondary | ICD-10-CM | POA: Diagnosis not present

## 2023-10-01 DIAGNOSIS — I6782 Cerebral ischemia: Secondary | ICD-10-CM | POA: Insufficient documentation

## 2023-10-01 DIAGNOSIS — R2689 Other abnormalities of gait and mobility: Secondary | ICD-10-CM | POA: Insufficient documentation

## 2023-10-01 DIAGNOSIS — R4182 Altered mental status, unspecified: Secondary | ICD-10-CM

## 2023-10-01 DIAGNOSIS — M6281 Muscle weakness (generalized): Secondary | ICD-10-CM | POA: Diagnosis not present

## 2023-10-01 DIAGNOSIS — R410 Disorientation, unspecified: Secondary | ICD-10-CM

## 2023-10-01 DIAGNOSIS — Z20822 Contact with and (suspected) exposure to covid-19: Secondary | ICD-10-CM | POA: Insufficient documentation

## 2023-10-01 DIAGNOSIS — I10 Essential (primary) hypertension: Secondary | ICD-10-CM | POA: Insufficient documentation

## 2023-10-01 DIAGNOSIS — R41840 Attention and concentration deficit: Secondary | ICD-10-CM | POA: Diagnosis not present

## 2023-10-01 DIAGNOSIS — R413 Other amnesia: Secondary | ICD-10-CM

## 2023-10-01 DIAGNOSIS — I700 Atherosclerosis of aorta: Secondary | ICD-10-CM | POA: Diagnosis not present

## 2023-10-01 DIAGNOSIS — G319 Degenerative disease of nervous system, unspecified: Secondary | ICD-10-CM | POA: Diagnosis not present

## 2023-10-01 DIAGNOSIS — E039 Hypothyroidism, unspecified: Secondary | ICD-10-CM | POA: Diagnosis not present

## 2023-10-01 DIAGNOSIS — F05 Delirium due to known physiological condition: Secondary | ICD-10-CM | POA: Insufficient documentation

## 2023-10-01 DIAGNOSIS — R531 Weakness: Secondary | ICD-10-CM | POA: Diagnosis not present

## 2023-10-01 DIAGNOSIS — R2681 Unsteadiness on feet: Secondary | ICD-10-CM | POA: Diagnosis not present

## 2023-10-01 DIAGNOSIS — J4489 Other specified chronic obstructive pulmonary disease: Secondary | ICD-10-CM | POA: Diagnosis not present

## 2023-10-01 DIAGNOSIS — R519 Headache, unspecified: Secondary | ICD-10-CM | POA: Diagnosis not present

## 2023-10-01 LAB — URINALYSIS, ROUTINE W REFLEX MICROSCOPIC
Bilirubin Urine: NEGATIVE
Glucose, UA: NEGATIVE mg/dL
Ketones, ur: NEGATIVE mg/dL
Leukocytes,Ua: NEGATIVE
Nitrite: NEGATIVE
Protein, ur: 30 mg/dL — AB
Specific Gravity, Urine: 1.011 (ref 1.005–1.030)
pH: 6 (ref 5.0–8.0)

## 2023-10-01 LAB — COMPREHENSIVE METABOLIC PANEL
ALT: 14 U/L (ref 0–44)
AST: 23 U/L (ref 15–41)
Albumin: 3.9 g/dL (ref 3.5–5.0)
Alkaline Phosphatase: 88 U/L (ref 38–126)
Anion gap: 13 (ref 5–15)
BUN: 19 mg/dL (ref 8–23)
CO2: 24 mmol/L (ref 22–32)
Calcium: 9.6 mg/dL (ref 8.9–10.3)
Chloride: 97 mmol/L — ABNORMAL LOW (ref 98–111)
Creatinine, Ser: 1.42 mg/dL — ABNORMAL HIGH (ref 0.44–1.00)
GFR, Estimated: 35 mL/min — ABNORMAL LOW (ref >60)
Glucose, Bld: 124 mg/dL — ABNORMAL HIGH (ref 70–99)
Potassium: 4.4 mmol/L (ref 3.5–5.1)
Sodium: 134 mmol/L — ABNORMAL LOW (ref 135–145)
Total Bilirubin: 0.7 mg/dL (ref 0.0–1.2)
Total Protein: 7.2 g/dL (ref 6.5–8.1)

## 2023-10-01 LAB — TSH: TSH: 3.651 u[IU]/mL (ref 0.350–4.500)

## 2023-10-01 LAB — CBC
HCT: 36.1 % (ref 36.0–46.0)
Hemoglobin: 11.7 g/dL — ABNORMAL LOW (ref 12.0–15.0)
MCH: 28.7 pg (ref 26.0–34.0)
MCHC: 32.4 g/dL (ref 30.0–36.0)
MCV: 88.7 fL (ref 80.0–100.0)
Platelets: 314 10*3/uL (ref 150–400)
RBC: 4.07 MIL/uL (ref 3.87–5.11)
RDW: 12.6 % (ref 11.5–15.5)
WBC: 10.7 10*3/uL — ABNORMAL HIGH (ref 4.0–10.5)
nRBC: 0 % (ref 0.0–0.2)

## 2023-10-01 LAB — RESP PANEL BY RT-PCR (RSV, FLU A&B, COVID)  RVPGX2
Influenza A by PCR: NEGATIVE
Influenza B by PCR: NEGATIVE
Resp Syncytial Virus by PCR: NEGATIVE
SARS Coronavirus 2 by RT PCR: NEGATIVE

## 2023-10-01 LAB — AMMONIA: Ammonia: 19 umol/L (ref 9–35)

## 2023-10-01 LAB — FOLATE: Folate: 23.4 ng/mL (ref >5.9)

## 2023-10-01 LAB — VITAMIN B12: Vitamin B-12: 211 pg/mL (ref 180–914)

## 2023-10-01 LAB — T4, FREE: Free T4: 1.06 ng/dL (ref 0.61–1.12)

## 2023-10-01 LAB — SEDIMENTATION RATE: Sed Rate: 13 mm/h (ref 0–22)

## 2023-10-01 MED ORDER — LEVOTHYROXINE SODIUM 50 MCG PO TABS
75.0000 ug | ORAL_TABLET | Freq: Every day | ORAL | Status: DC
  Administered 2023-10-02 – 2023-10-09 (×7): 75 ug via ORAL
  Filled 2023-10-01 (×8): qty 2

## 2023-10-01 MED ORDER — HYDROCHLOROTHIAZIDE 12.5 MG PO TABS
12.5000 mg | ORAL_TABLET | Freq: Every morning | ORAL | Status: DC
  Administered 2023-10-02 – 2023-10-09 (×7): 12.5 mg via ORAL
  Filled 2023-10-01 (×8): qty 1

## 2023-10-01 MED ORDER — HALOPERIDOL 0.5 MG PO TABS
2.0000 mg | ORAL_TABLET | Freq: Once | ORAL | Status: AC
  Administered 2023-10-07: 2 mg via ORAL
  Filled 2023-10-01 (×2): qty 4

## 2023-10-01 MED ORDER — QUETIAPINE FUMARATE 25 MG PO TABS
25.0000 mg | ORAL_TABLET | Freq: Two times a day (BID) | ORAL | Status: DC
Start: 2023-10-01 — End: 2023-10-09
  Administered 2023-10-02 – 2023-10-09 (×13): 25 mg via ORAL
  Filled 2023-10-01 (×14): qty 1

## 2023-10-01 MED ORDER — METOPROLOL SUCCINATE ER 25 MG PO TB24
25.0000 mg | ORAL_TABLET | Freq: Every day | ORAL | Status: DC
  Administered 2023-10-02 – 2023-10-09 (×7): 25 mg via ORAL
  Filled 2023-10-01 (×8): qty 1

## 2023-10-01 MED ORDER — HALOPERIDOL LACTATE 5 MG/ML IJ SOLN
2.0000 mg | Freq: Once | INTRAMUSCULAR | Status: AC
  Administered 2023-10-01: 2 mg via INTRAMUSCULAR
  Filled 2023-10-01: qty 1

## 2023-10-01 MED ORDER — QUETIAPINE FUMARATE 25 MG PO TABS: 25.0000 mg | ORAL_TABLET | Freq: Every day | ORAL | Status: DC

## 2023-10-01 NOTE — ED Notes (Signed)
Pt attempting to leave her bed and undress. She was able to redirected back into her bed. Gown donned and posey alarm placed. Pt still significantly confused.

## 2023-10-01 NOTE — BH Assessment (Signed)
TTS Consult will be completed by IRIS. IRIS Coordinator will communicate in established secure chat assessment time and provider name. Thanks

## 2023-10-01 NOTE — ED Triage Notes (Addendum)
Pt's son reports pt wouldn't answer the phone this morning so he went to check on her. When he arrived he reports pt is very confused and reports having a headache. Pt initially reports she has a headache, then a few minutes later she reports her head doesn't hurt and her whole body does. He reports pt couldn't open her pocketbook and her bed wasn't made which is very unusual for her. Son spoke with her yesterday on the phone at 1530 and she sounded normal at that time.

## 2023-10-01 NOTE — ED Notes (Signed)
Pt ate half of her dinner tray.

## 2023-10-01 NOTE — ED Notes (Signed)
Transition of Care [facility]) - Emergency Department Mini Assessment   Patient Details  Name: Brittany Holden MRN: [id number] Date of Birth: August 08, 1930  [facility]) CM/SW Contact:    Isabella Bowens, LCSWA Phone Number: 10/01/2023, 4:15 PM   Clinical Narrative: CSW was consulted for possible memory care placement. CSW spoke with son before noticing she was under a psych hold. CSW will follow up once pt is cleared.    ED Mini Assessment: What brought you to the Emergency Department? : Altered Mental Status  Barriers to Discharge: ED Awaiting Psych Clearance  Barrier interventions: CSW will follow up once pt is removed from psych hold regarding SNF placement fro memory care  Means of departure: Ambulance       Patient Contact and Communications Key Contact 1: [name]   Spoke with: Son Contact Date: 10/01/23,   Contact time: 0350 Contact Phone Number: [phone] Call outcome: Memory care Placement  Patient states their goals for this hospitalization and ongoing recovery are:: Figure out what all is going on with pt and possible [facility] SNF placement CMS Medicare.gov Compare Post Acute Care list provided to:: Patient Represenative (must comment) (Son-Roger) Choice offered to / list presented to : Adult Children  Admission diagnosis:  Confusion/ Headache Patient Active Problem List   Diagnosis Date Noted   Chest pain with high risk for cardiac etiology 11/11/2014   Chest pain 11/11/2014   CKD (chronic kidney disease) stage 3, GFR 30-59 ml/min (HCC) 11/11/2014   Essential hypertension 11/11/2014   Hyperlipidemia 11/11/2014   PCP:  Benita Stabile, MD Pharmacy:   [facility] - [place], [place] - [address] [place] [address] Phone: [phone] Fax: [phone]  [facility] - [place], [place] - [address] [place] [address] Phone: [phone] Fax:  [phone]  [facility] [address] [place] [address] Phone: [phone] Fax: [phone]

## 2023-10-01 NOTE — ED Provider Notes (Signed)
[place] EMERGENCY DEPARTMENT AT [facility] Provider Note   CSN: [id number] Arrival date & time: 10/01/23  9528     History  Chief Complaint  Patient presents with   Altered Mental Status    Brittany Holden is a 89 y.o. female.  Pt presents with confusion. Son indicates saw pt two days ago and seemed more or less herself then - indicates at baseline, some issues with memory, but lives independently, attends to ADLs, etc. Yesterday, patient call him 20+ times which is unusual. Today, went to check on patient and she was very confused, not able to carry on conversation, seemed very confused, was sitting down but not wearing pants and wearing only one shoe, money and pocket book possessions scattered across floor, etc. .  Pt had fall three weeks ago - no known injury then. No new or recent fall known. Pt had recently c/o headache but denies currently. Pt denies any specific physical pain or complaint currently, but is confused/very limited historian - level 5 caveat. No report of fevers. No report of change in medication.   The history is provided by the patient, medical records and a relative. The history is limited by the condition of the patient.       Home Medications Prior to Admission medications   Medication Sig Start Date End Date Taking? Authorizing Provider  bacitracin ointment Apply 1 Application topically 2 (two) times daily. 12/07/22   Kommor, [place], MD  calcitRIOL (ROCALTROL) 0.25 MCG capsule Take 0.25 mcg by mouth 2 (two) times a week. 04/08/23   [provider]  chlorthalidone (HYGROTON) 25 MG tablet Take 25 mg by mouth every other day. 11/02/20 05/05/23  [provider]  Cholecalciferol 25 MCG (1000 UT) tablet Take 400 Units by mouth daily.    [provider]  Cyanocobalamin (VITAMIN B-12 CR PO) Take by mouth. Patient not taking: Reported on 05/04/2022    [provider]  donepezil (ARICEPT) 10 MG tablet Take 10 mg by mouth daily.  08/30/23   [provider]  hydrochlorothiazide (MICROZIDE) 12.5 MG capsule Take 12.5 mg by mouth every morning. 04/08/23   [provider]  levothyroxine (SYNTHROID, LEVOTHROID) 75 MCG tablet Take 75 mcg by mouth daily.      [provider]  lidocaine (LIDODERM) 5 % Place 1 patch onto the skin daily. Remove & Discard patch within 12 hours or as directed by MD 08/20/22   Sherian Maroon A, PA  metoprolol succinate (TOPROL XL) 25 MG 24 hr tablet Take 1 tablet (25 mg total) by mouth daily. 04/20/20   Pricilla Riffle, MD  Multiple Minerals-Vitamins (CALCIUM & VIT D3 BONE HEALTH PO) Take 1 tablet by mouth daily. Patient not taking: Reported on 05/04/2022    [provider]  mupirocin ointment (BACTROBAN) 2 % Apply 1 Application topically daily. 04/12/23   Particia Nearing, PA-C  predniSONE (DELTASONE) 10 MG tablet Take 4 tablets (40 mg total) by mouth daily. 08/20/22   Peter Garter, PA  simvastatin (ZOCOR) 20 MG tablet Take 20 mg by mouth every morning.     [provider]  sodium zirconium cyclosilicate (LOKELMA) 5 g packet Take 5 g by mouth daily.    [provider]      Allergies    Patient has no known allergies.    Review of Systems   Review of Systems  Constitutional:  Negative for fever.  Eyes:  Negative for pain and visual disturbance.  Respiratory:  Negative for cough and shortness of breath.   Cardiovascular:  Negative for chest pain.  Gastrointestinal:  Negative for abdominal pain and vomiting.  Genitourinary:  Negative for dysuria.  Musculoskeletal:  Negative for back pain and neck pain.  Skin:  Negative for wound.  Neurological:  Negative for speech difficulty.  Psychiatric/Behavioral:  Positive for confusion.     Physical Exam Updated Vital Signs BP 130/80 (BP Location: Left Arm)   Pulse 90   Temp 99.3 F (37.4 C) (Oral)   Resp 20   Ht 1.829 m (6')   Wt 59 kg   SpO2 99%   BMI 17.63 kg/m  Physical Exam Vitals  and nursing note reviewed.  Constitutional:      Appearance: Normal appearance. She is well-developed.  HENT:     Head: Atraumatic.     Nose: Nose normal.     Mouth/Throat:     Mouth: Mucous membranes are moist.  Eyes:     General: No scleral icterus.    Conjunctiva/sclera: Conjunctivae normal.     Pupils: Pupils are equal, round, and reactive to light.  Neck:     Vascular: No carotid bruit.     Trachea: No tracheal deviation.  Cardiovascular:     Rate and Rhythm: Normal rate and regular rhythm.     Pulses: Normal pulses.     Heart sounds: Normal heart sounds. No murmur heard.    No friction rub. No gallop.  Pulmonary:     Effort: Pulmonary effort is normal. No respiratory distress.     Breath sounds: Normal breath sounds.  Abdominal:     General: Bowel sounds are normal. There is no distension.     Palpations: Abdomen is soft. There is no mass.     Tenderness: There is no abdominal tenderness. There is no guarding.  Genitourinary:    Comments: No cva tenderness.  Musculoskeletal:        General: No swelling.     Cervical back: Normal range of motion and neck supple. No rigidity. No muscular tenderness.     Comments: CTLS spine, non tender, aligned, no step off. Good rom bil extremities without pain or focal bony tenderness.   Skin:    General: Skin is warm and dry.     Findings: No rash.  Neurological:     Mental Status: She is alert.     Comments: Alert, speech clear, not grossly dysarthric, but very confused. Motor/sens grossly intact bil.   Psychiatric:        Mood and Affect: Mood normal.     ED Results / Procedures / Treatments   Labs (all labs ordered are listed, but only abnormal results are displayed) Results for orders placed or performed during the hospital encounter of 10/01/23  Resp panel by RT-PCR (RSV, Flu A&B, Covid) Anterior Nasal Swab   Collection Time: 10/01/23 11:25 AM   Specimen: Anterior Nasal Swab  Result Value Ref Range   SARS Coronavirus 2  by RT PCR NEGATIVE NEGATIVE   Influenza A by PCR NEGATIVE NEGATIVE   Influenza B by PCR NEGATIVE NEGATIVE   Resp Syncytial Virus by PCR NEGATIVE NEGATIVE  CBC   Collection Time: 10/01/23 11:26 AM  Result Value Ref Range   WBC 10.7 (H) 4.0 - 10.5 K/uL   RBC 4.07 3.87 - 5.11 MIL/uL   Hemoglobin 11.7 (L) 12.0 - 15.0 g/dL   HCT 16.1 09.6 - 04.5 %   MCV 88.7 80.0 - 100.0 fL   MCH 28.7  26.0 - 34.0 pg   MCHC 32.4 30.0 - 36.0 g/dL   RDW 08.6 57.8 - 46.9 %   Platelets 314 150 - 400 K/uL   nRBC 0.0 0.0 - 0.2 %  Comprehensive metabolic panel   Collection Time: 10/01/23 11:26 AM  Result Value Ref Range   Sodium 134 (L) 135 - 145 mmol/L   Potassium 4.4 3.5 - 5.1 mmol/L   Chloride 97 (L) 98 - 111 mmol/L   CO2 24 22 - 32 mmol/L   Glucose, Bld 124 (H) 70 - 99 mg/dL   BUN 19 8 - 23 mg/dL   Creatinine, Ser 6.29 (H) 0.44 - 1.00 mg/dL   Calcium 9.6 8.9 - 52.8 mg/dL   Total Protein 7.2 6.5 - 8.1 g/dL   Albumin 3.9 3.5 - 5.0 g/dL   AST 23 15 - 41 U/L   ALT 14 0 - 44 U/L   Alkaline Phosphatase 88 38 - 126 U/L   Total Bilirubin 0.7 0.0 - 1.2 mg/dL   GFR, Estimated 35 (L) >60 mL/min   Anion gap 13 5 - 15  Urinalysis, Routine w reflex microscopic -Urine, Catheterized   Collection Time: 10/01/23 12:21 PM  Result Value Ref Range   Color, Urine YELLOW YELLOW   APPearance CLEAR CLEAR   Specific Gravity, Urine 1.011 1.005 - 1.030   pH 6.0 5.0 - 8.0   Glucose, UA NEGATIVE NEGATIVE mg/dL   Hgb urine dipstick SMALL (A) NEGATIVE   Bilirubin Urine NEGATIVE NEGATIVE   Ketones, ur NEGATIVE NEGATIVE mg/dL   Protein, ur 30 (A) NEGATIVE mg/dL   Nitrite NEGATIVE NEGATIVE   Leukocytes,Ua NEGATIVE NEGATIVE   RBC / HPF 0-5 0 - 5 RBC/hpf   WBC, UA 0-5 0 - 5 WBC/hpf   Bacteria, UA RARE (A) NONE SEEN   Squamous Epithelial / HPF 0-5 0 - 5 /HPF   Hyaline Casts, UA PRESENT   Vitamin B12   Collection Time: 10/01/23  2:45 PM  Result Value Ref Range   Vitamin B-12 211 180 - 914 pg/mL   CT Head Wo  Contrast Result Date: 10/01/2023 CLINICAL DATA:  Mental status change, unknown cause. Confusion. Headache. EXAM: CT HEAD WITHOUT CONTRAST TECHNIQUE: Contiguous axial images were obtained from the base of the skull through the vertex without intravenous contrast. RADIATION DOSE REDUCTION: This exam was performed according to the departmental dose-optimization program which includes automated exposure control, adjustment of the mA and/or kV according to patient size and/or use of iterative reconstruction technique. COMPARISON:  09/13/2023 FINDINGS: Brain: Age related atrophy. Chronic small-vessel ischemic changes of the white matter. No sign of acute infarction, mass lesion, hemorrhage, hydrocephalus or extra-axial collection. Vascular: There is atherosclerotic calcification of the major vessels at the base of the brain. Skull: Negative Sinuses/Orbits: Complete opacification and some expansion of the left maxillary sinus. Question if there could be a sinus mass. Other visualized sinuses are clear. Orbits negative. Other: None IMPRESSION: 1. No acute CT finding. Age related atrophy. Chronic small-vessel ischemic changes of the white matter. 2. Complete opacification and some expansion of the left maxillary sinus. Question if there could be a sinus mass. Electronically Signed   By: Paulina Fusi M.D.   On: 10/01/2023 11:37   DG Chest 2 View Result Date: 09/13/2023 CLINICAL DATA:  Pain after fall EXAM: CHEST - 2 VIEW COMPARISON:  X-ray 12/30/2022. FINDINGS: Hyperinflation. Chronic lung changes. No consolidation, pneumothorax or effusion. No edema. Normal cardiopericardial silhouette. There are some compression deformities along the mid and  lower thoracic spine at multiple levels. These are not seen on the x-ray of 2020. These are appear to be increased from the study of April 2024. IMPRESSION: Hyperinflation with chronic lung changes. No pneumothorax or effusion. Increasing compression deformities along the thoracic  spine at multiple levels compared to available prior exams. These are of uncertain age and etiology but could be acute. Please correlate with symptoms and if needed dedicated thoracic spine CT to further delineate as clinically appropriate Electronically Signed   By: Karen Kays M.D.   On: 09/13/2023 13:08   DG Pelvis 1-2 Views Result Date: 09/13/2023 CLINICAL DATA:  Fall. EXAM: PELVIS - 1-2 VIEW COMPARISON:  None Available. FINDINGS: Pelvis is intact with normal and symmetric sacroiliac joints. No acute displaced fracture. No dislocation. No aggressive osseous lesion. Visualized sacral arcuate lines are unremarkable. Unremarkable symphysis pubis. There are mild degenerative changes of bilateral hip joints without significant joint space narrowing. Osteophytosis of the superior acetabulum. No radiopaque foreign bodies. IMPRESSION: *No acute displaced fracture of the pelvis. Consider additional imaging, as clinically indicated. Electronically Signed   By: Jules Schick M.D.   On: 09/13/2023 13:08   CT Head Wo Contrast Result Date: 09/13/2023 CLINICAL DATA:  Polytrauma, blunt EXAM: CT HEAD WITHOUT CONTRAST CT CERVICAL SPINE WITHOUT CONTRAST TECHNIQUE: Multidetector CT imaging of the head and cervical spine was performed following the standard protocol without intravenous contrast. Multiplanar CT image reconstructions of the cervical spine were also generated. RADIATION DOSE REDUCTION: This exam was performed according to the departmental dose-optimization program which includes automated exposure control, adjustment of the mA and/or kV according to patient size and/or use of iterative reconstruction technique. COMPARISON:  None Available. FINDINGS: CT HEAD FINDINGS Brain: No hemorrhage. No hydrocephalus. No extra-axial fluid collection. No CT evidence of an acute cortical infarct. No mass effect. No mass lesion. Vascular: No hyperdense vessel or unexpected calcification. Skull: Normal. Negative for fracture or  focal lesion. Sinuses/Orbits: No middle ear or mastoid effusion. Paranasal sinuses are notable for complete opacification of the left maxillary sinus. Orbits are unremarkable. Other: None. CT CERVICAL SPINE FINDINGS Alignment: Trace anterolisthesis of C7 on T1. Skull base and vertebrae: No acute fracture. No primary bone lesion or focal pathologic process. Soft tissues and spinal canal: No CT evidence of high-grade spinal canal stenosis. Disc levels:  No CT evidence of high-grade spinal canal stenosis. Upper chest: Negative. Other: Asymmetric enlargement of the palatine tonsils on the left (series 4, image 46). Recommend correlation with direct visualization to exclude the possibility of an underlying mass. IMPRESSION: 1. No CT evidence of intracranial injury. 2. No acute fracture or traumatic subluxation of the cervical spine. 3. Asymmetric enlargement of the palatine tonsils on the left. Recommend correlation with direct visualization to exclude the possibility of an underlying mass. Electronically Signed   By: Lorenza Cambridge M.D.   On: 09/13/2023 12:46   CT Cervical Spine Wo Contrast Result Date: 09/13/2023 CLINICAL DATA:  Polytrauma, blunt EXAM: CT HEAD WITHOUT CONTRAST CT CERVICAL SPINE WITHOUT CONTRAST TECHNIQUE: Multidetector CT imaging of the head and cervical spine was performed following the standard protocol without intravenous contrast. Multiplanar CT image reconstructions of the cervical spine were also generated. RADIATION DOSE REDUCTION: This exam was performed according to the departmental dose-optimization program which includes automated exposure control, adjustment of the mA and/or kV according to patient size and/or use of iterative reconstruction technique. COMPARISON:  None Available. FINDINGS: CT HEAD FINDINGS Brain: No hemorrhage. No hydrocephalus. No extra-axial fluid collection. No CT  evidence of an acute cortical infarct. No mass effect. No mass lesion. Vascular: No hyperdense vessel or  unexpected calcification. Skull: Normal. Negative for fracture or focal lesion. Sinuses/Orbits: No middle ear or mastoid effusion. Paranasal sinuses are notable for complete opacification of the left maxillary sinus. Orbits are unremarkable. Other: None. CT CERVICAL SPINE FINDINGS Alignment: Trace anterolisthesis of C7 on T1. Skull base and vertebrae: No acute fracture. No primary bone lesion or focal pathologic process. Soft tissues and spinal canal: No CT evidence of high-grade spinal canal stenosis. Disc levels:  No CT evidence of high-grade spinal canal stenosis. Upper chest: Negative. Other: Asymmetric enlargement of the palatine tonsils on the left (series 4, image 46). Recommend correlation with direct visualization to exclude the possibility of an underlying mass. IMPRESSION: 1. No CT evidence of intracranial injury. 2. No acute fracture or traumatic subluxation of the cervical spine. 3. Asymmetric enlargement of the palatine tonsils on the left. Recommend correlation with direct visualization to exclude the possibility of an underlying mass. Electronically Signed   By: Lorenza Cambridge M.D.   On: 09/13/2023 12:46     EKG None  Radiology CT Head Wo Contrast Result Date: 10/01/2023 CLINICAL DATA:  Mental status change, unknown cause. Confusion. Headache. EXAM: CT HEAD WITHOUT CONTRAST TECHNIQUE: Contiguous axial images were obtained from the base of the skull through the vertex without intravenous contrast. RADIATION DOSE REDUCTION: This exam was performed according to the departmental dose-optimization program which includes automated exposure control, adjustment of the mA and/or kV according to patient size and/or use of iterative reconstruction technique. COMPARISON:  09/13/2023 FINDINGS: Brain: Age related atrophy. Chronic small-vessel ischemic changes of the white matter. No sign of acute infarction, mass lesion, hemorrhage, hydrocephalus or extra-axial collection. Vascular: There is  atherosclerotic calcification of the major vessels at the base of the brain. Skull: Negative Sinuses/Orbits: Complete opacification and some expansion of the left maxillary sinus. Question if there could be a sinus mass. Other visualized sinuses are clear. Orbits negative. Other: None IMPRESSION: 1. No acute CT finding. Age related atrophy. Chronic small-vessel ischemic changes of the white matter. 2. Complete opacification and some expansion of the left maxillary sinus. Question if there could be a sinus mass. Electronically Signed   By: Paulina Fusi M.D.   On: 10/01/2023 11:37    Procedures Procedures    Medications Ordered in ED Medications  QUEtiapine (SEROQUEL) tablet 25 mg (has no administration in time range)    ED Course/ Medical Decision Making/ A&P                                 Medical Decision Making Problems Addressed: Acute alteration in mental status: acute illness or injury with systemic symptoms that poses a threat to life or bodily functions 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): acute illness or injury with systemic symptoms that poses a threat to life or bodily functions    Details: Acute/chronic Confusion: acute illness or injury Memory deficit: chronic illness or injury  Amount and/or Complexity of Data Reviewed Independent Historian:     Details: family External Data Reviewed: notes. Labs: ordered. Decision-making details documented in ED Course. Radiology: ordered and independent interpretation performed. Decision-making details documented in ED Course. ECG/medicine tests: ordered.  Risk Prescription drug management. Decision regarding hospitalization.   Iv ns. Continuous pulse ox and cardiac monitoring. Labs ordered/sent. Imaging ordered.   Differential diagnosis includes head injury/sdh, uti, cva, etc. Dispo  decision including potential need for admission considered - will get labs and imaging and reassess.   Reviewed nursing notes  and prior charts for additional history. External reports reviewed. Additional history from: family.   Cardiac monitor: sinus rhythm, rate 90.  Labs reviewed/interpreted by me - wbc 10, hct 36, covid and flu neg. Ua neg for uti. Dementia panel pending.   CT reviewed/interpreted by me - no hem.  Etiology of increased confusion, mildly agitated behavior not entirely clear from initial workup. Will add additional lab and get mri.   On discussion w family member, not entirely clear how acute vs progressive symptoms are. It appears w some prior issues w memory, forgetfulness, periodic confusion, which now do appear worse, or at unmanageable point in terms of patient being able to live independently  as she currently dose.   Will start low dose seroquel. [facility] team consulted for assessment and further medical recommendations. TOC consulted to facilitate possible memory care or snf placement. Dementia panel pending. Signed out to oncoming EDP to f/u with above.   The patient has been placed in psychiatric observation due to the need to provide a safe environment for the patient while obtaining psychiatric consultation and evaluation, as well as ongoing medical and medication management to treat the patient's condition.          Final Clinical Impression(s) / ED Diagnoses Final diagnoses:  None    Rx / DC Orders ED Discharge Orders     None         Cathren Laine, MD 10/01/23 1549

## 2023-10-01 NOTE — Consult Note (Cosign Needed)
[facility] Consult Note  Patient Name: Brittany Holden MRN: [id number] DOB: 09-Sep-1929 DATE OF Consult: 10/01/2023  PRIMARY PSYCHIATRIC DIAGNOSES  1.  Dementia with behavioral disturbance vs Delirium   RECOMMENDATIONS  disorientation and confusion; disrobing, not able to care for self at this time; lacks capacity for medical decision making as she doesn't have the ability to  understand, appreciate, and rationally manipulate information regarding her medical condition.   Inpt psych admission recommended:    [] YES       [x]  NO   If yes:       []   Pt meets involuntary commitment criteria if not voluntary       []    Pt does not meet involuntary commitment criteria and must be         voluntary. If patient is not voluntary, then discharge is recommended.   Medication recommendations:  recommendations for quetiapine 25mg  po twice daily for agitation--olanzapine 2.5mg  po/IM every 2-4 hours prn agitation Do not exceed 20mg  in a 24 hour period   Monitor for side effects: EPS, hypotension, SpO2, QTc prolongation-Black box warning: increased risk of mortality and cardiovascular events  Please ensure K> 4, Mg> 2 and Qtc < 500 when using antipsychotic medications    Non-Medication recommendations:   Recommend laboratory workup CBC (to exclude anemia and infection), urinalysis   (to exclude infection), CMP (to investigate metabolic disease), ammonia levels, RPR, ESR, Vit  b12/Folate, HIV status. Depending on lab workup, Recommend neurology consult  for cognitive decline.  maintain safety obs sitter, bed alarm, and padded safety railings.  To help tpatient remain calm and aware of their surroundings:  Provide a clock and calendar and refer to them during the day Communicate simply about any change in activity, such as time for lunch or time for bed Keep familiar and favorite objects and pictures around, but avoid a cluttered space Approach the person calmly Identify yourself or other  people Avoid arguments Use comfort measures, such as touch, if they help Reduce noise levels and other distractions create a calm environment Provide eyeglasses and hearing aids Address  underlying causes for increased confusion/agitation--pain, nutrition, toileting, etc.    While it is our hope that medication and therapeutic management recommendations listed above can help stabilize this patient and enable return to prior living environment and level of care, this patient is not appropriate for an inpatient psychiatry unit due to the patient's level of neurocognitive impairment, inability to meaningfully participate in unit programming, and higher level of staffing/management needs than are provided on inpatient psychiatry units. If patient is deemed medically stable and not experiencing delirium; we recommend a memory care unit at this time. We are happy to remain involved in the care of this patient.  Follow-Up Telepsychiatry C/L services:            [x]  We will continue to follow this patient with you.             []  Will sign off for now. Please re-consult our service as necessary.  Thank you for involving [place] in the care of this patient. If you have any additional questions or concerns, please call [phone] and ask for me or the provider on-call.  TELEPSYCHIATRY ATTESTATION & CONSENT  As the provider for this telehealth consult, I attest that I verified the patient's identity using two separate identifiers, introduced myself to the patient, provided my credentials, disclosed my location, and performed this encounter via a HIPAA-compliant, real-time, face-to-face, two-way, interactive audio and  video platform and with the full consent and agreement of the patient (or guardian as applicable.)  Patient physical location: ED at [facility]. Telehealth provider physical location: home office in state of [place]  Video start time: 19:30pm  (Central Time) Video end time: 19:40pm  (Central  Time)  IDENTIFYING DATA  Brittany Holden is an 89 y.o. year-old female for whom a psychiatric consultation has been ordered by the primary provider. The patient was identified using two separate identifiers.  CHIEF COMPLAINT/REASON FOR CONSULT    HISTORY OF PRESENT ILLNESS (HPI)  The patient presented to hospital with son reporting acute altered mental status; per record review son reported patient living alone and able to manage home until today when he noticed she was acutely confused.   Hx of treatment for dementia   Currently prescribed: donepezil  She is able to tell me her son's name is Fredrik Cove and provided her address as [address] Both information is accurate.  However, she tells me she lives with her second husband and that he and her son take care of her; she has been living alone per record review.   She is irritable, confused, disoriented, poor historian, restless, disrobing; has safety sitter, bed alarm, and padded rails.  She does engage with provider, she thinks she is in a loud restaurant. She stated "You are asking me too many questions, so just shut up, go talk to someone else who will listen to you, I'm not going to listen to you, I don't know what you are saying".   She denied feeling any pain, any recent falls (she did fall 3 weeks ago),  she states " I am not hurt, I am 89 yo and still going"  She denied feeling depressed or anxious stating "I ain't done nothing to make me nervous or sad".   Reviewed active outpatient medication list/reviewed labs. Obtained Collateral information from medical record.  PAST PSYCHIATRIC HISTORY  Review of record does not indicate any hx of mental health treatment until treated with donepezil,for dementia  PAST MEDICAL HISTORY  Past Medical History:  Diagnosis Date   Arrhythmia    Diverticulosis of colon (without mention of hemorrhage)    Dysrhythmia    Hypertension    Hypothyroidism    IBS (irritable bowel syndrome)     Neuropathy    Palpitations    Personal history of colonic polyps 1999 & 2007   hyperplastic      HOME MEDICATIONS  Facility Ordered Medications  Medication   QUEtiapine (SEROQUEL) tablet 25 mg   [START ON 10/02/2023] hydrochlorothiazide (MICROZIDE) capsule 12.5 mg   levothyroxine (SYNTHROID) tablet 75 mcg   metoprolol succinate (TOPROL-XL) 24 hr tablet 25 mg   PTA Medications  Medication Sig   simvastatin (ZOCOR) 20 MG tablet Take 20 mg by mouth every morning.    levothyroxine (SYNTHROID, LEVOTHROID) 75 MCG tablet Take 75 mcg by mouth daily.     Multiple Minerals-Vitamins (CALCIUM & VIT D3 BONE HEALTH PO) Take 1 tablet by mouth daily. (Patient not taking: Reported on 05/04/2022)   metoprolol succinate (TOPROL XL) 25 MG 24 hr tablet Take 1 tablet (25 mg total) by mouth daily.   chlorthalidone (HYGROTON) 25 MG tablet Take 25 mg by mouth every other day.   Cyanocobalamin (VITAMIN B-12 CR PO) Take by mouth. (Patient not taking: Reported on 05/04/2022)   Cholecalciferol 25 MCG (1000 UT) tablet Take 400 Units by mouth daily.   sodium zirconium cyclosilicate (LOKELMA) 5  g packet Take 5 g by mouth daily.   lidocaine (LIDODERM) 5 % Place 1 patch onto the skin daily. Remove & Discard patch within 12 hours or as directed by MD   predniSONE (DELTASONE) 10 MG tablet Take 4 tablets (40 mg total) by mouth daily.   bacitracin ointment Apply 1 Application topically 2 (two) times daily.   hydrochlorothiazide (MICROZIDE) 12.5 MG capsule Take 12.5 mg by mouth every morning.   calcitRIOL (ROCALTROL) 0.25 MCG capsule Take 0.25 mcg by mouth 2 (two) times a week.   mupirocin ointment (BACTROBAN) 2 % Apply 1 Application topically daily.   donepezil (ARICEPT) 10 MG tablet Take 10 mg by mouth daily.     ALLERGIES  No Known Allergies  SOCIAL & SUBSTANCE USE HISTORY  Was living alone, son Fredrik Cove checks in on her    she has never smoked. She has never used smokeless tobacco. she does not drink alcohol or use  illicit drugs.        FAMILY HISTORY  Family Psychiatric History (if known):  unknown at this time  MENTAL STATUS EXAM (MSE)  Mental Status Exam: General Appearance: disheveled, she is disrobing  Orientation:  Other:  confused and disoriented  Memory:  Immediate;   Poor Recent;   Poor Remote;   Poor  Concentration:  Concentration: Poor  Recall:  Poor  Attention  Fair  Eye Contact:  Fair  Speech:  Normal Rate  Language:  Fair  Volume:  Increased  Mood: irritable  Affect:  Congruent  Thought Process:  Disorganized  Thought Content:  Illogical  Suicidal Thoughts:  No  Homicidal Thoughts:  No  Judgement:  Impaired  Insight:  Lacking  Psychomotor Activity:  Restlessness  Akathisia:  NA  Fund of Knowledge:  Poor    Assets:  Housing Social Support  Cognition:  Impaired,  Moderate  ADL's:  Impaired  AIMS (if indicated):       VITALS  Blood pressure 130/80, pulse 90, temperature 99.3 F (37.4 C), temperature source Oral, resp. rate 20, height 6' (1.829 m), weight 59 kg, SpO2 99%.  LABS  Admission on 10/01/2023  Component Date Value Ref Range Status   WBC 10/01/2023 10.7 (H)  4.0 - 10.5 K/uL Final   RBC 10/01/2023 4.07  3.87 - 5.11 MIL/uL Final   Hemoglobin 10/01/2023 11.7 (L)  12.0 - 15.0 g/dL Final   HCT 40/98/1191 36.1  36.0 - 46.0 % Final   MCV 10/01/2023 88.7  80.0 - 100.0 fL Final   MCH 10/01/2023 28.7  26.0 - 34.0 pg Final   MCHC 10/01/2023 32.4  30.0 - 36.0 g/dL Final   RDW 47/82/9562 12.6  11.5 - 15.5 % Final   Platelets 10/01/2023 314  150 - 400 K/uL Final   nRBC 10/01/2023 0.0  0.0 - 0.2 % Final   Performed at [facility], [address] [place], [place] [address]   Sodium 10/01/2023 134 (L)  135 - 145 mmol/L Final   Potassium 10/01/2023 4.4  3.5 - 5.1 mmol/L Final   Chloride 10/01/2023 97 (L)  98 - 111 mmol/L Final   CO2 10/01/2023 24  22 - 32 mmol/L Final   Glucose, Bld 10/01/2023 124 (H)  70 - 99 mg/dL Final   Glucose reference range applies only to  samples taken after fasting for at least 8 hours.   BUN 10/01/2023 19  8 - 23 mg/dL Final   Creatinine, Ser 10/01/2023 1.42 (H)  0.44 - 1.00 mg/dL Final  Calcium 10/01/2023 9.6  8.9 - 10.3 mg/dL Final   Total Protein 40/98/1191 7.2  6.5 - 8.1 g/dL Final   Albumin 47/82/9562 3.9  3.5 - 5.0 g/dL Final   AST 13/04/6577 23  15 - 41 U/L Final   ALT 10/01/2023 14  0 - 44 U/L Final   Alkaline Phosphatase 10/01/2023 88  38 - 126 U/L Final   Total Bilirubin 10/01/2023 0.7  0.0 - 1.2 mg/dL Final   GFR, Estimated 10/01/2023 35 (L)  >60 mL/min Final   Comment: (NOTE) Calculated using the CKD-EPI Creatinine Equation (2021)    Anion gap 10/01/2023 13  5 - 15 Final   Performed at [facility], [address], [place], [place] [address]   Color, Urine 10/01/2023 YELLOW  YELLOW Final   APPearance 10/01/2023 CLEAR  CLEAR Final   Specific Gravity, Urine 10/01/2023 1.011  1.005 - 1.030 Final   pH 10/01/2023 6.0  5.0 - 8.0 Final   Glucose, UA 10/01/2023 NEGATIVE  NEGATIVE mg/dL Final   Hgb urine dipstick 10/01/2023 SMALL (A)  NEGATIVE Final   Bilirubin Urine 10/01/2023 NEGATIVE  NEGATIVE Final   Ketones, ur 10/01/2023 NEGATIVE  NEGATIVE mg/dL Final   Protein, ur 95/28/4132 30 (A)  NEGATIVE mg/dL Final   Nitrite 44/09/270 NEGATIVE  NEGATIVE Final   Leukocytes,Ua 10/01/2023 NEGATIVE  NEGATIVE Final   RBC / HPF 10/01/2023 0-5  0 - 5 RBC/hpf Final   WBC, UA 10/01/2023 0-5  0 - 5 WBC/hpf Final   Bacteria, UA 10/01/2023 RARE (A)  NONE SEEN Final   Squamous Epithelial / HPF 10/01/2023 0-5  0 - 5 /HPF Final   Hyaline Casts, UA 10/01/2023 PRESENT   Final   Performed at [facility], [address] [place], [place] [address]   SARS Coronavirus 2 by RT PCR 10/01/2023 NEGATIVE  NEGATIVE Final   Comment: (NOTE) SARS-CoV-2 target nucleic acids are NOT DETECTED.  The SARS-CoV-2 RNA is generally detectable in upper respiratory specimens during the acute phase of infection. The lowest concentration of SARS-CoV-2  viral copies this assay can detect is 138 copies/mL. A negative result does not preclude SARS-Cov-2 infection and should not be used as the sole basis for treatment or other patient management decisions. A negative result may occur with  improper specimen collection/handling, submission of specimen other than nasopharyngeal swab, presence of viral mutation(s) within the areas targeted by this assay, and inadequate number of viral copies(<138 copies/mL). A negative result must be combined with clinical observations, patient history, and epidemiological information. The expected result is Negative.  Fact Sheet for Patients:  BloggerCourse.com  Fact Sheet for Healthcare Providers:  SeriousBroker.it  This test is no                          t yet approved or cleared by the [place] FDA and  has been authorized for detection and/or diagnosis of SARS-CoV-2 by FDA under an Emergency Use Authorization (EUA). This EUA will remain  in effect (meaning this test can be used) for the duration of the COVID-19 declaration under Section 564(b)(1) of the Act, 21 U.S.C.section 360bbb-3(b)(1), unless the authorization is terminated  or revoked sooner.       Influenza A by PCR 10/01/2023 NEGATIVE  NEGATIVE Final   Influenza B by PCR 10/01/2023 NEGATIVE  NEGATIVE Final   Comment: (NOTE) The Xpert Xpress SARS-CoV-2/FLU/RSV plus assay is intended as an aid in the diagnosis of influenza from Nasopharyngeal swab specimens  and should not be used as a sole basis for treatment. Nasal washings and aspirates are unacceptable for Xpert Xpress SARS-CoV-2/FLU/RSV testing.  Fact Sheet for Patients: BloggerCourse.com  Fact Sheet for Healthcare Providers: SeriousBroker.it  This test is not yet approved or cleared by the [place] FDA and has been authorized for detection and/or diagnosis of SARS-CoV-2  by FDA under an Emergency Use Authorization (EUA). This EUA will remain in effect (meaning this test can be used) for the duration of the COVID-19 declaration under Section 564(b)(1) of the Act, 21 U.S.C. section 360bbb-3(b)(1), unless the authorization is terminated or revoked.     Resp Syncytial Virus by PCR 10/01/2023 NEGATIVE  NEGATIVE Final   Comment: (NOTE) Fact Sheet for Patients: BloggerCourse.com  Fact Sheet for Healthcare Providers: SeriousBroker.it  This test is not yet approved or cleared by the [place] FDA and has been authorized for detection and/or diagnosis of SARS-CoV-2 by FDA under an Emergency Use Authorization (EUA). This EUA will remain in effect (meaning this test can be used) for the duration of the COVID-19 declaration under Section 564(b)(1) of the Act, 21 U.S.C. section 360bbb-3(b)(1), unless the authorization is terminated or revoked.  Performed at [facility], [address], [place], [place] [address]    TSH 10/01/2023 3.651  0.350 - 4.500 uIU/mL Final   Comment: Performed by a 3rd Generation assay with a functional sensitivity of <=0.01 uIU/mL. Performed at [facility], [address], [place], [place] [address]    Free T4 10/01/2023 1.06  0.61 - 1.12 ng/dL Final   Comment: (NOTE) Biotin ingestion may interfere with free T4 tests. If the results are inconsistent with the TSH level, previous test results, or the clinical presentation, then consider biotin interference. If needed, order repeat testing after stopping biotin. Performed at [facility], [address] [place], [place] [address]    Vitamin B-12 10/01/2023 211  180 - 914 pg/mL Final   Comment: (NOTE) This assay is not validated for testing neonatal or myeloproliferative syndrome specimens for Vitamin B12 levels. Performed at [facility], [address], [place], [place] [address]    Sed Rate 10/01/2023 13  0 - 22 mm/hr  Final   Performed at [facility], [address], [place], [place] [address]   Folate 10/01/2023 23.4  >5.9 ng/mL Final   Comment: RESULT CONFIRMED BY MANUAL DILUTION Performed at [facility], [address] [place], [place] [address]     PSYCHIATRIC REVIEW OF SYSTEMS (ROS)  Depression:      [x]  Denies all symptoms of depression [] Depressed mood       [] Insomnia/hypersomnia              [] Fatigue        [] Change in appetite     [] Anhedonia                                [] Difficulty concentrating      [] Hopelessness             [] Worthlessness [] Guilt/shame                [] Psychomotor agitation/retardation   Mania:     [x] Denies all symptoms of mania [] Elevated mood           [] Irritability         [] Pressured speech         []  Grandiosity         []   Decreased need for sleep                                                 [] Increased energy          []  Increase in goal directed activity                                       [] Flight of ideas    []  Excessive involvement in high-risk behaviors                   []  Distractibility     Psychosis:     [x] Denies all symptoms of psychosis [] Paranoia         []  Auditory Hallucinations          [] Visual hallucinations         [] ELOC        [] IOR                [] Delusions   Suicide:    [x]  Denies SI/plan/intent []  Passive SI         []   Active SI         [] Plan           [] Intent   Homicide:  [x]   Denies HI/plan/intent []  Passive HI         []  Active HI         [] Plan            [] Intent           [] Identified Target    Additional findings:      Musculoskeletal: No abnormal movements observed      Gait & Station: Laying/Sitting      Pain Screening: Denies       RISK FORMULATION/ASSESSMENT  Is the patient experiencing any suicidal or homicidal ideations: No       Explain if yes:  Protective factors considered for safety management:   Access to adequate health care Children Positive social support:  Risk factors/concerns  considered for safety management:  Age over 86 Impulsivity Unwillingness to seek help  Is there a safety management plan with the patient and treatment team to minimize risk factors and promote protective factors: Yes           Explain: safety sitter, bed alarm and padded railing due to patient's disorientation and confusion; disrobing, not able to care for self at this time; lacks capacity for medical decision making as she doesn't have the ability to  understand, appreciate, and rationally manipulate information regarding her medical condition. Is crisis care placement or psychiatric hospitalization recommended: No     Based on my current evaluation and risk assessment, patient is determined at this time to be at:  High risk risk of harm to self due to inability to meet basic needs; disoriented and confused  *RISK ASSESSMENT Risk assessment is a dynamic process; it is possible that this patient's condition, and risk level, may change. This should be re-evaluated and managed over time as appropriate. Please re-consult psychiatric consult services if additional assistance is needed in terms of risk assessment and management. If your team decides to discharge this patient, please advise the patient how to best access emergency psychiatric services, or to  call 911, if their condition worsens or they feel unsafe in any way.  Total time spent in this encounter was 60 minutes with greater than 50% of time spent in counseling and coordination of care.     Dr. Olivia Mackie. Christell Constant, PhD, MSN, APRN, PMHNP-BC, [facility]per, NP Telepsychiatry Consult Services

## 2023-10-01 NOTE — ED Notes (Signed)
Pt is significantly agitated, confused, and restless. She is unable to be redirected. Vital signs are difficult to assess due to restlessness.

## 2023-10-01 NOTE — ED Notes (Signed)
[facility] attempting TTS at this time

## 2023-10-01 NOTE — ED Notes (Signed)
Bed alarm going off. Patient climbing out of bed. Patient has leg through stretcher railing. Seizure pads placed on bilateral railings for patient protection and safety

## 2023-10-01 NOTE — ED Notes (Signed)
Bed alarm going off. Recruitment consultant and another nurse at bedside to redirect patient

## 2023-10-02 ENCOUNTER — Emergency Department (HOSPITAL_COMMUNITY): Payer: Medicare HMO

## 2023-10-02 DIAGNOSIS — R410 Disorientation, unspecified: Secondary | ICD-10-CM | POA: Diagnosis not present

## 2023-10-02 DIAGNOSIS — I6782 Cerebral ischemia: Secondary | ICD-10-CM | POA: Diagnosis not present

## 2023-10-02 DIAGNOSIS — F03918 Unspecified dementia, unspecified severity, with other behavioral disturbance: Secondary | ICD-10-CM | POA: Diagnosis not present

## 2023-10-02 DIAGNOSIS — R41840 Attention and concentration deficit: Secondary | ICD-10-CM | POA: Diagnosis not present

## 2023-10-02 DIAGNOSIS — G319 Degenerative disease of nervous system, unspecified: Secondary | ICD-10-CM | POA: Diagnosis not present

## 2023-10-02 DIAGNOSIS — R4182 Altered mental status, unspecified: Secondary | ICD-10-CM | POA: Diagnosis not present

## 2023-10-02 DIAGNOSIS — R29818 Other symptoms and signs involving the nervous system: Secondary | ICD-10-CM | POA: Diagnosis not present

## 2023-10-02 DIAGNOSIS — I10 Essential (primary) hypertension: Secondary | ICD-10-CM | POA: Diagnosis not present

## 2023-10-02 LAB — RPR: RPR Ser Ql: NONREACTIVE

## 2023-10-02 MED ORDER — ZIPRASIDONE MESYLATE 20 MG IM SOLR
10.0000 mg | Freq: Once | INTRAMUSCULAR | Status: AC
  Administered 2023-10-02: 10 mg via INTRAMUSCULAR
  Filled 2023-10-02: qty 20

## 2023-10-02 MED ORDER — STERILE WATER FOR INJECTION IJ SOLN
INTRAMUSCULAR | Status: AC
  Filled 2023-10-02: qty 10

## 2023-10-02 MED ORDER — LORAZEPAM 1 MG PO TABS
1.0000 mg | ORAL_TABLET | Freq: Once | ORAL | Status: AC
  Administered 2023-10-02: 1 mg via ORAL
  Filled 2023-10-02: qty 1

## 2023-10-02 NOTE — ED Notes (Signed)
Placed fall risk bracelet on pt.

## 2023-10-02 NOTE — ED Notes (Signed)
Patient assisted to bedside commode. Patient is calm and cooperative at this time

## 2023-10-02 NOTE — ED Provider Notes (Signed)
MRI that was done yesterday did not have any acute abnormalities.  Was consistent with some dementia findings.  Patient's plan is still for placement.  Secondary to her confusion and dementia.   Vanetta Mulders, MD 10/02/23 1556

## 2023-10-02 NOTE — Progress Notes (Signed)
PT Cancellation Note  Patient Details Name: Brittany Holden MRN: [id number] DOB: 04-30-1930   Cancelled Treatment:      Pt combative, agitated, and reduced capacity to follow commands. Deferred by Nurse and Nurse Tech at this time.   PT will continue to follow.   Nelida Meuse PT, DPT [facility] [phone] office   Nelida Meuse 10/02/2023, 1:40 PM

## 2023-10-02 NOTE — ED Notes (Addendum)
Pt continues to be restless and trying to get out of bed regardless of multiple attempts by staff and pt's son to redirect. Pt is confused and insists that she is leaving.  Son remains at bedside.

## 2023-10-02 NOTE — ED Provider Notes (Signed)
Emergency Medicine Observation Re-evaluation Note  Brittany Holden is a 89 y.o. female, seen on rounds today.  Pt initially presented to the ED for complaints of Altered Mental Status Currently, the patient is nursing home placement.  Physical Exam  BP 119/71 (BP Location: Left Arm)   Pulse 99   Temp 98.5 F (36.9 C) (Oral)   Resp 15   Ht 6' (1.829 m)   Wt 59 kg   SpO2 97%   BMI 17.63 kg/m  Physical Exam Asleep and in no acute distress  ED Course / MDM  EKG:EKG Interpretation Date/Time:  Tuesday October 01 2023 19:55:36 EST Ventricular Rate:  98 PR Interval:  142 QRS Duration:  90 QT Interval:  335 QTC Calculation: 428 R Axis:   -39  Text Interpretation: Sinus arrhythmia Multiple premature complexes, vent & supraven Probable left ventricular hypertrophy When compared with ECG of 05/15/2021, No significant change was found Confirmed by Dione Booze ([id number]) on 10/02/2023 2:13:51 AM  I have reviewed the labs performed to date as well as medications administered while in observation.  Recent changes in the last 24 hours include none.  Plan  Current plan is for nursing home placement.    Bethann Berkshire, MD 10/02/23 [phone]

## 2023-10-03 DIAGNOSIS — R41840 Attention and concentration deficit: Secondary | ICD-10-CM | POA: Diagnosis not present

## 2023-10-03 DIAGNOSIS — I10 Essential (primary) hypertension: Secondary | ICD-10-CM | POA: Diagnosis not present

## 2023-10-03 DIAGNOSIS — F03918 Unspecified dementia, unspecified severity, with other behavioral disturbance: Secondary | ICD-10-CM | POA: Diagnosis not present

## 2023-10-03 DIAGNOSIS — R4182 Altered mental status, unspecified: Secondary | ICD-10-CM | POA: Diagnosis not present

## 2023-10-03 NOTE — ED Provider Notes (Signed)
Emergency Medicine Observation Re-evaluation Note  Brittany Holden is a 89 y.o. female, seen on rounds today.  Pt initially presented to the ED for complaints of Altered Mental Status Currently, the patient is resting quietly.  Physical Exam  BP (!) 146/112 (BP Location: Right Arm) Comment: Pt was not able to sit still during BP cuff inflation.  Pulse 100   Temp 97.9 F (36.6 C) (Oral)   Resp 16   Ht 6' (1.829 m)   Wt 59 kg   SpO2 93%   BMI 17.63 kg/m  Physical Exam General: No acute distress Cardiac: Well-perfused Lungs: Nonlabored Psych: Calm  ED Course / MDM  EKG:EKG Interpretation Date/Time:  Tuesday October 01 2023 19:55:36 EST Ventricular Rate:  98 PR Interval:  142 QRS Duration:  90 QT Interval:  335 QTC Calculation: 428 R Axis:   -39  Text Interpretation: Sinus arrhythmia Multiple premature complexes, vent & supraven Probable left ventricular hypertrophy When compared with ECG of 05/15/2021, No significant change was found Confirmed by Dione Booze ([id number]) on 10/02/2023 2:13:51 AM  I have reviewed the labs performed to date as well as medications administered while in observation.  Recent changes in the last 24 hours include social work working on placement.  Plan  Current plan is for placement.  Son is here this morning and he said he would like to talk to social work regarding possibly [place].    Terrilee Files, MD 10/03/23 [phone]

## 2023-10-03 NOTE — [place] FL2 (Signed)
[place] MEDICAID FL2 LEVEL OF CARE FORM     IDENTIFICATION  Patient Name: Brittany Holden Birthdate: October 23, 1929 Sex: female Admission Date (Current Location): 10/01/2023  [facility] and [place] Number:  [employer] and Address:  [facility],  [address] [place] [address]      Provider Number: [id number]  Attending Physician Name and Address:  Kayleen Memos. Charm Barges, MD  Relative Name and Phone Number:  [name] (son) [phone]    Current Level of Care: Hospital Recommended Level of Care: Skilled Nursing Facility Prior Approval Number:    Date Approved/Denied:   PASRR Number: Pending  Discharge Plan: SNF    Current Diagnoses: Patient Active Problem List   Diagnosis Date Noted   Acute alteration in mental status 10/01/2023   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) 10/01/2023   Chest pain with high risk for cardiac etiology 11/11/2014   Chest pain 11/11/2014   CKD (chronic kidney disease) stage 3, GFR 30-59 ml/min (HCC) 11/11/2014   Essential hypertension 11/11/2014   Hyperlipidemia 11/11/2014    Orientation RESPIRATION BLADDER Height & Weight     Self  Normal Continent Weight: 130 lb (59 kg) Height:  6' (182.9 cm)  BEHAVIORAL SYMPTOMS/MOOD NEUROLOGICAL BOWEL NUTRITION STATUS        Diet (see AVS)  AMBULATORY STATUS COMMUNICATION OF NEEDS Skin   Extensive Assist Verbally Bruising (on right hand)                       Personal Care Assistance Level of Assistance  Bathing, Feeding, Dressing Bathing Assistance: Maximum assistance Feeding assistance: Independent Dressing Assistance: Maximum assistance     Functional Limitations Info  Sight, Hearing, Speech Sight Info: Impaired (wears eye glasses) Hearing Info: Adequate Speech Info: Adequate    SPECIAL CARE FACTORS FREQUENCY  PT (By licensed PT), OT (By licensed OT)     PT Frequency: 5 x a week OT Frequency: 5 x a week            Contractures  Contractures Info: Not present    Additional Factors Info  Code Status, Psychotropic Code Status Info: FULL   Psychotropic Info: Haldol and Seroquel         Current Medications (10/03/2023):  This is the current hospital active medication list Current Facility-Administered Medications  Medication Dose Route Frequency Provider Last Rate Last Admin   haloperidol (HALDOL) tablet 2 mg  2 mg Oral Once Gloris Manchester, MD       hydrochlorothiazide (HYDRODIURIL) tablet 12.5 mg  12.5 mg Oral q morning Cathren Laine, MD   12.5 mg at 10/03/23 0946   levothyroxine (SYNTHROID) tablet 75 mcg  75 mcg Oral Daily Cathren Laine, MD   75 mcg at 10/02/23 8413   metoprolol succinate (TOPROL-XL) 24 hr tablet 25 mg  25 mg Oral Daily Cathren Laine, MD   25 mg at 10/03/23 0946   QUEtiapine (SEROQUEL) tablet 25 mg  25 mg Oral BID Gloris Manchester, MD   25 mg at 10/03/23 2440   Current Outpatient Medications  Medication Sig Dispense Refill   bacitracin ointment Apply 1 Application topically 2 (two) times daily. 425 g 0   calcitRIOL (ROCALTROL) 0.25 MCG capsule Take 0.25 mcg by mouth 2 (two) times a week.     chlorthalidone (HYGROTON) 25 MG tablet Take 25 mg by mouth every other day.     Cholecalciferol 25 MCG (1000 UT) tablet Take 400 Units by mouth daily.  Cyanocobalamin (VITAMIN B-12 CR PO) Take by mouth. (Patient not taking: Reported on 05/04/2022)     donepezil (ARICEPT) 10 MG tablet Take 10 mg by mouth daily.     hydrochlorothiazide (MICROZIDE) 12.5 MG capsule Take 12.5 mg by mouth every morning.     levothyroxine (SYNTHROID, LEVOTHROID) 75 MCG tablet Take 75 mcg by mouth daily.       lidocaine (LIDODERM) 5 % Place 1 patch onto the skin daily. Remove & Discard patch within 12 hours or as directed by MD 30 patch 0   metoprolol succinate (TOPROL XL) 25 MG 24 hr tablet Take 1 tablet (25 mg total) by mouth daily. 90 tablet 3   Multiple Minerals-Vitamins (CALCIUM & VIT D3 BONE HEALTH PO) Take 1 tablet by mouth daily.  (Patient not taking: Reported on 05/04/2022)     mupirocin ointment (BACTROBAN) 2 % Apply 1 Application topically daily. 60 g 0   predniSONE (DELTASONE) 10 MG tablet Take 4 tablets (40 mg total) by mouth daily. 20 tablet 0   simvastatin (ZOCOR) 20 MG tablet Take 20 mg by mouth every morning.      sodium zirconium cyclosilicate (LOKELMA) 5 g packet Take 5 g by mouth daily.       Discharge Medications: Please see after visit summary for a list of discharge medications.  Relevant Imaging Results:  Relevant Lab Results:   Additional Information SSN : [id number]  Isabella Bowens, LCSWA

## 2023-10-03 NOTE — Evaluation (Signed)
Physical Therapy Evaluation Patient Details Name: Brittany Holden MRN: [id number] DOB: 08-22-1930 Today's Date: 10/03/2023  History of Present Illness  Pt presents with confusion. Son indicates saw pt two days ago and seemed more or less herself then - indicates at baseline, some issues with memory, but lives independently, attends to ADLs, etc. Yesterday, patient call him 20+ times which is unusual. Today, went to check on patient and she was very confused, not able to carry on conversation, seemed very confused, was sitting down but not wearing pants and wearing only one shoe, money and pocket book possessions scattered across floor, etc. .  Pt had fall three weeks ago - no known injury then. No new or recent fall known. Pt had recently c/o headache but denies currently. Pt denies any specific physical pain or complaint currently, but is confused/very limited historian - level 5 caveat. No report of fevers. No report of change in medication.   Clinical Impression  Patient was agreeable to therapy. Min/mod assist was given for bed mobility. Required verbal/tactile cueing to push up on elbows to perform supine to sit. Used RW and mod/max assist for sit to stand and bed to [facility] transfer. Lot of cueing to move feet to side step to the left for transfer. Was transferred back to bed after using BSC. Placed back in bed with call bell. Patient required moderate cueing to not move before PT was ready for next step. Patient will benefit from continued skilled physical therapy in hospital and recommended venue below to increase strength, balance, endurance for safe ADLs and gait.         If plan is discharge home, recommend the following: A lot of help with walking and/or transfers;A lot of help with bathing/dressing/bathroom;Assistance with cooking/housework;Help with stairs or ramp for entrance   Can travel by private vehicle        Equipment Recommendations None recommended by PT  Recommendations for  Other Services       Functional Status Assessment Patient has had a recent decline in their functional status and demonstrates the ability to make significant improvements in function in a reasonable and predictable amount of time.     Precautions / Restrictions Precautions Precautions: Fall Restrictions Weight Bearing Restrictions Per Provider Order: No      Mobility  Bed Mobility Overal bed mobility: Needs Assistance Bed Mobility: Supine to Sit     Supine to sit: Min assist, Mod assist          Transfers Overall transfer level: Needs assistance Equipment used: Rolling walker (2 wheels) Transfers: Sit to/from Stand, Bed to chair/wheelchair/BSC Sit to Stand: Mod assist, Max assist   Step pivot transfers: Mod assist, Max assist            Ambulation/Gait Ambulation/Gait assistance: Mod assist, Max assist Gait Distance (Feet): 3 Feet (side steps) Assistive device: Rolling walker (2 wheels) Gait Pattern/deviations: Step-to pattern, Decreased step length - right, Decreased step length - left Gait velocity: slow     General Gait Details: slow, unsteady and heavy reliance on RW  Stairs            Wheelchair Mobility     Tilt Bed    Modified Rankin (Stroke Patients Only)       Balance Overall balance assessment: Needs assistance Sitting-balance support: Bilateral upper extremity supported, Feet supported Sitting balance-Leahy Scale: Poor Sitting balance - Comments: poor/fair   Standing balance support: Bilateral upper extremity supported, Reliant on assistive device for balance Standing balance-Leahy  Scale: Poor Standing balance comment: poor/fair                             Pertinent Vitals/Pain Pain Assessment Pain Assessment: No/denies pain    Home Living Family/patient expects to be discharged to:: Private residence Living Arrangements: Alone Available Help at Discharge: Family Type of Home: House Home Access: Stairs to  enter Entrance Stairs-Rails: None Entrance Stairs-Number of Steps: 3   Home Layout: One level Home Equipment: Agricultural consultant (2 wheels);BSC/3in1      Prior Function Prior Level of Function : Independent/Modified Independent                     Extremity/Trunk Assessment        Lower Extremity Assessment Lower Extremity Assessment: Generalized weakness       Communication   Communication Communication: Difficulty following commands/understanding Following commands: Follows one step commands consistently Cueing Techniques: Verbal cues;Tactile cues  Cognition Arousal: Alert Behavior During Therapy: Impulsive Overall Cognitive Status: Impaired/Different from baseline Area of Impairment: Following commands                       Following Commands: Follows one step commands consistently                General Comments      Exercises     Assessment/Plan    PT Assessment Patient needs continued PT services  PT Problem List Decreased strength;Decreased range of motion;Decreased activity tolerance;Decreased balance;Decreased mobility;Decreased coordination       PT Treatment Interventions Gait training;DME instruction;Stair training;Functional mobility training;Therapeutic activities;Therapeutic exercise;Balance training    PT Goals (Current goals can be found in the Care Plan section)  Acute Rehab PT Goals Patient Stated Goal: to return home PT Goal Formulation: With patient Time For Goal Achievement: 10/17/23 Potential to Achieve Goals: Fair    Frequency Min 3X/week     Co-evaluation               AM-PAC PT "6 Clicks" Mobility  Outcome Measure Help needed turning from your back to your side while in a flat bed without using bedrails?: A Lot Help needed moving from lying on your back to sitting on the side of a flat bed without using bedrails?: A Lot Help needed moving to and from a bed to a chair (including a wheelchair)?: A  Lot Help needed standing up from a chair using your arms (e.g., wheelchair or bedside chair)?: A Lot Help needed to walk in hospital room?: A Lot Help needed climbing 3-5 steps with a railing? : Total 6 Click Score: 11    End of Session   Activity Tolerance: Patient tolerated treatment well Patient left: in bed;with call bell/phone within reach Nurse Communication: Mobility status PT Visit Diagnosis: Unsteadiness on feet (R26.81);Other abnormalities of gait and mobility (R26.89);Muscle weakness (generalized) (M62.81)    Time: 1010-1038 PT Time Calculation (min) (ACUTE ONLY): 28 min   Charges:     PT Treatments $Therapeutic Activity: 23-37 mins PT General Charges $$ ACUTE PT VISIT: 1 Visit         Amiee Wiley SPT

## 2023-10-03 NOTE — ED Notes (Signed)
CSW submitted for pt PASRR and awaiting for PT note to send out to facilities and start Auth.

## 2023-10-03 NOTE — ED Notes (Signed)
CSW spoke with son at bedside and pt was sleeping. CSW explained to son that in order to place patient into a facility ,PT would need to be able to work with patient for insurance purposes since private paying is not an option. CSW will follow up with son after PT has seen and able to complete evaluation. CSW also shared with son that a facility will not be able to accept pt with behaviors. Son stated that he understood. Son did share that he would like for pt to stay local in [place]. CSW did inform son that he will need to think long-term once a facility can accept and apply for medicaid. TOC will continue to follow.

## 2023-10-03 NOTE — Plan of Care (Signed)
  Problem: Acute Rehab PT Goals(only PT should resolve) Goal: Pt Will Go Supine/Side To Sit Outcome: Progressing Flowsheets (Taken 10/03/2023 1352) Pt will go Supine/Side to Sit: with minimal assist Goal: Patient Will Transfer Sit To/From Stand Outcome: Progressing Flowsheets (Taken 10/03/2023 1352) Patient will transfer sit to/from stand:  with minimal assist  with moderate assist Goal: Pt Will Transfer Bed To Chair/Chair To Bed Outcome: Progressing Flowsheets (Taken 10/03/2023 1352) Pt will Transfer Bed to Chair/Chair to Bed:  with min assist  with mod assist Goal: Pt Will Ambulate Outcome: Progressing Flowsheets (Taken 10/03/2023 1352) Pt will Ambulate:  10 feet  with rolling walker  with minimal assist  with moderate assist    Brittany Holden SPT

## 2023-10-03 NOTE — ED Notes (Signed)
Pt given night time meds. Vaseline applied to pt dry lips.This RN asked pt if she wanted any of her food to eat and pt denied and stated that she was not hungry. Vitals taken and WNL.

## 2023-10-04 DIAGNOSIS — R4182 Altered mental status, unspecified: Secondary | ICD-10-CM | POA: Diagnosis not present

## 2023-10-04 DIAGNOSIS — I10 Essential (primary) hypertension: Secondary | ICD-10-CM | POA: Diagnosis not present

## 2023-10-04 DIAGNOSIS — F03918 Unspecified dementia, unspecified severity, with other behavioral disturbance: Secondary | ICD-10-CM | POA: Diagnosis not present

## 2023-10-04 DIAGNOSIS — R41840 Attention and concentration deficit: Secondary | ICD-10-CM | POA: Diagnosis not present

## 2023-10-04 NOTE — ED Notes (Signed)
Assisted pt to restroom,  pt able to stand and pivot with 2 person assist.  Lunch tray given, son at beside helping pt to eat lunch.

## 2023-10-04 NOTE — ED Notes (Signed)
Pt son is visiting.

## 2023-10-04 NOTE — ED Notes (Signed)
Pt restless and trying to get up to use restroom by throwing leg over the side of the stretcher.  Pt redirected multiple times.  Pt placed on bedpan. Pt is confused.

## 2023-10-04 NOTE — ED Provider Notes (Signed)
Emergency Medicine Observation Re-evaluation Note  Brittany Holden is a 89 y.o. female, seen on rounds today.  Pt initially presented to the ED for complaints of Altered Mental Status Currently, the patient is resting quietly.  Physical Exam  BP (!) 152/98   Pulse (!) 101   Temp 98.2 F (36.8 C)   Resp 19   Ht 6' (1.829 m)   Wt 59 kg   SpO2 99%   BMI 17.63 kg/m  Physical Exam General: No acute distress Cardiac: Well-perfused Lungs: Nonlabored Psych: Calm  ED Course / MDM  EKG:EKG Interpretation Date/Time:  Tuesday October 01 2023 19:55:36 EST Ventricular Rate:  98 PR Interval:  142 QRS Duration:  90 QT Interval:  335 QTC Calculation: 428 R Axis:   -39  Text Interpretation: Sinus arrhythmia Multiple premature complexes, vent & supraven Probable left ventricular hypertrophy When compared with ECG of 05/15/2021, No significant change was found Confirmed by Dione Booze ([id number]) on 10/02/2023 2:13:51 AM  I have reviewed the labs performed to date as well as medications administered while in observation.  Recent changes in the last 24 hours include social work working on placement. Evaluated by PT.   Plan  Current plan is for placement.     Loetta Rough, MD 10/04/23 [phone]

## 2023-10-04 NOTE — ED Notes (Signed)
CSW spoke with pt son who was at bedside with pt . Son asked if pt had any SNF offers since she is in the hall now and before more and more restless. At this time , pt has only two facilities that are "considering" did call [facility] to see what additional information needed but had to leave a confidential VM and reached out to [place] via secure message.

## 2023-10-05 MED ORDER — ZIPRASIDONE MESYLATE 20 MG IM SOLR
10.0000 mg | Freq: Once | INTRAMUSCULAR | Status: DC
  Filled 2023-10-05: qty 20

## 2023-10-05 MED ORDER — STERILE WATER FOR INJECTION IJ SOLN
INTRAMUSCULAR | Status: AC
  Filled 2023-10-05: qty 10

## 2023-10-05 MED ORDER — HALOPERIDOL 0.5 MG PO TABS
2.0000 mg | ORAL_TABLET | Freq: Once | ORAL | Status: AC
  Administered 2023-10-05: 2 mg via ORAL

## 2023-10-05 NOTE — ED Notes (Signed)
Pt resting with eyes closed; sitter at bedside  

## 2023-10-05 NOTE — ED Notes (Signed)
Pt is spitting, kicking staff members and being combative at this time

## 2023-10-05 NOTE — ED Notes (Signed)
Pt's son at beside this am

## 2023-10-05 NOTE — ED Notes (Signed)
Pt resting with eyes closed in bed; sitter at bedside

## 2023-10-05 NOTE — ED Notes (Addendum)
Son is wondering what can be done to help patient get a place to stay other than the hallway in ED. The son had spoken to [facility], but patient was apparently rejected for her diagnosis or medication, but doesn't have more information and wondering what can be done to help her have the best opportunity for placement somewhere. Sitters have been putting entries in her notes using the "Suicide Precautions Documentation" notes to report on her status, but she is not under any Suicide Precautions according to her records, so believe the sitters who are providing extra precautions are used to entering it there for suicide patients and not used to documenting elderly safety care.

## 2023-10-06 DIAGNOSIS — I10 Essential (primary) hypertension: Secondary | ICD-10-CM | POA: Diagnosis not present

## 2023-10-06 DIAGNOSIS — R4182 Altered mental status, unspecified: Secondary | ICD-10-CM | POA: Diagnosis not present

## 2023-10-06 DIAGNOSIS — R41840 Attention and concentration deficit: Secondary | ICD-10-CM | POA: Diagnosis not present

## 2023-10-06 DIAGNOSIS — F03918 Unspecified dementia, unspecified severity, with other behavioral disturbance: Secondary | ICD-10-CM | POA: Diagnosis not present

## 2023-10-06 NOTE — ED Notes (Signed)
Pt escorted to the restroom via wheelchair by Recruitment consultant.

## 2023-10-06 NOTE — TOC Progression Note (Signed)
Transition of Care [place]) - Progression Note    Patient Details  Name: Brittany Holden MRN: [id number] Date of Birth: [birth year]  [facility] CM/SW Contact  Karn Cassis, [place] Phone Number: 10/06/2023, 2:06 PM  Clinical Narrative:  Per Jill Side with [facility], they are still considering pt. TOC will follow up in AM.        Barriers to Discharge: ED Awaiting Psych Clearance  Expected Discharge Plan and Services                                               Social Determinants of Health (SDOH) Interventions SDOH Screenings   Tobacco Use: Low Risk  (10/01/2023)    Readmission Risk Interventions     No data to display

## 2023-10-06 NOTE — ED Notes (Signed)
Pt provided decaf coffee and a [place] sandwich.

## 2023-10-06 NOTE — ED Provider Notes (Signed)
Emergency Medicine Observation Re-evaluation Note  Brittany Holden is a 89 y.o. female, seen on rounds today.  Pt initially presented to the ED for complaints of Altered Mental Status Currently, the patient is sleeping.  Physical Exam  BP 133/86 (BP Location: Right Arm)   Pulse 68   Temp 98.4 F (36.9 C)   Resp 20   Ht 6' (1.829 m)   Wt 59 kg   SpO2 97%   BMI 17.63 kg/m  Physical Exam General: No acute distress, sleeping Lungs: No respiratory distress noted  ED Course / MDM  EKG:EKG Interpretation Date/Time:  Tuesday October 01 2023 19:55:36 EST Ventricular Rate:  98 PR Interval:  142 QRS Duration:  90 QT Interval:  335 QTC Calculation: 428 R Axis:   -39  Text Interpretation: Sinus arrhythmia Multiple premature complexes, vent & supraven Probable left ventricular hypertrophy When compared with ECG of 05/15/2021, No significant change was found Confirmed by Dione Booze ([id number]) on 10/02/2023 2:13:51 AM  I have reviewed the labs performed to date as well as medications administered while in observation.  Recent changes in the last 24 hours include none overnight.  Plan  Current plan is for continuing to look for SNF placement for rehabilitation and help with ADLs.    Durwin Glaze, MD 10/06/23 [phone]

## 2023-10-07 DIAGNOSIS — F03918 Unspecified dementia, unspecified severity, with other behavioral disturbance: Secondary | ICD-10-CM | POA: Diagnosis not present

## 2023-10-07 DIAGNOSIS — R41840 Attention and concentration deficit: Secondary | ICD-10-CM | POA: Diagnosis not present

## 2023-10-07 DIAGNOSIS — R4182 Altered mental status, unspecified: Secondary | ICD-10-CM | POA: Diagnosis not present

## 2023-10-07 DIAGNOSIS — I10 Essential (primary) hypertension: Secondary | ICD-10-CM | POA: Diagnosis not present

## 2023-10-07 MED ORDER — HALOPERIDOL 0.5 MG PO TABS
2.0000 mg | ORAL_TABLET | Freq: Once | ORAL | Status: AC
  Administered 2023-10-07: 2 mg via ORAL
  Filled 2023-10-07: qty 4

## 2023-10-07 MED ORDER — HALOPERIDOL 0.5 MG PO TABS
2.0000 mg | ORAL_TABLET | Freq: Four times a day (QID) | ORAL | Status: DC | PRN
  Filled 2023-10-07: qty 4

## 2023-10-07 NOTE — ED Provider Notes (Signed)
Emergency Medicine Observation Re-evaluation Note  Brittany Holden is a 89 y.o. female, seen on rounds today.  Pt initially presented to the ED for complaints of Altered Mental Status Currently, the patient is awake and alert.  Pt's son is at bedside.  Pt is more alert this morning.  She has no complaints. SW attempting placement.  The last note said possible [facility]  Physical Exam  BP 119/80   Pulse 96   Temp 98.2 F (36.8 C) (Oral)   Resp 20   Ht 6' (1.829 m)   Wt 59 kg   SpO2 (!) 81%   BMI 17.63 kg/m  Physical Exam General: awake and alert Cardiac: rr Lungs: clear Psych: calm  ED Course / MDM  EKG:EKG Interpretation Date/Time:  Tuesday October 01 2023 19:55:36 EST Ventricular Rate:  98 PR Interval:  142 QRS Duration:  90 QT Interval:  335 QTC Calculation: 428 R Axis:   -39  Text Interpretation: Sinus arrhythmia Multiple premature complexes, vent & supraven Probable left ventricular hypertrophy When compared with ECG of 05/15/2021, No significant change was found Confirmed by Dione Booze ([id number]) on 10/02/2023 2:13:51 AM  I have reviewed the labs performed to date as well as medications administered while in observation.  Recent changes in the last 24 hours include none.  Plan  Current plan is for rehab.    Jacalyn Lefevre, MD 10/07/23 [phone]

## 2023-10-07 NOTE — ED Notes (Signed)
Pt restlessly trying to get out of bed and states she needs to leave. Pt redirected and assisted to bathroom by NT. Pt continues to be restless. EDP notified.

## 2023-10-07 NOTE — ED Notes (Signed)
CSW spoke with pt son this morning. Son asked about updates on SNF bed offers. CSW shared with son that [facility] and [facility] both say " considering" . Son did stated that he De[facility] with [facility] came to see pt. CSW will follow up with Debbie to see if they can offer bed so auth can be started. TOC will continue to follow.

## 2023-10-07 NOTE — ED Notes (Signed)
2 NT took pt to bathroom, changed her gown and got her back in bed and cleaned her sheets.

## 2023-10-08 DIAGNOSIS — I10 Essential (primary) hypertension: Secondary | ICD-10-CM | POA: Diagnosis not present

## 2023-10-08 DIAGNOSIS — R4182 Altered mental status, unspecified: Secondary | ICD-10-CM | POA: Diagnosis not present

## 2023-10-08 DIAGNOSIS — F03918 Unspecified dementia, unspecified severity, with other behavioral disturbance: Secondary | ICD-10-CM | POA: Diagnosis not present

## 2023-10-08 DIAGNOSIS — R41840 Attention and concentration deficit: Secondary | ICD-10-CM | POA: Diagnosis not present

## 2023-10-08 NOTE — ED Provider Notes (Signed)
 Emergency Medicine Observation Re-evaluation Note  Brittany Holden is a 89 y.o. female, seen on rounds today.  Pt initially presented to the ED for complaints of Altered Mental Status Currently, the patient is not having any acute complaints.  Physical Exam  BP 124/78 (BP Location: Left Arm)   Pulse 81   Temp 98.2 F (36.8 C) (Oral)   Resp 16   Ht 6' (1.829 m)   Wt 59 kg   SpO2 96%   BMI 17.63 kg/m  Physical Exam General: Resting comfortably in wheelchair after coming back from going to the bathroom Lungs: Normal work of breathing Psych: Calm  ED Course / MDM  EKG:EKG Interpretation Date/Time:  Tuesday October 01 2023 19:55:36 EST Ventricular Rate:  98 PR Interval:  142 QRS Duration:  90 QT Interval:  335 QTC Calculation: 428 R Axis:   -39  Text Interpretation: Sinus arrhythmia Multiple premature complexes, vent & supraven Probable left ventricular hypertrophy When compared with ECG of 05/15/2021, No significant change was found Confirmed by Raford Lenis ([id number]) on 10/02/2023 2:13:51 AM  I have reviewed the labs performed to date as well as medications administered while in observation.  Recent changes in the last 24 hours include no acute events.  Plan  Current plan is for placement.    Yolande Lamar BROCKS, MD 10/08/23 [phone]

## 2023-10-08 NOTE — ED Notes (Signed)
Patient has been resting all day, no issues noted and no problems or concerns at this time

## 2023-10-08 NOTE — ED Notes (Signed)
 CSW spoke with [facility] at [facility] regarding an update with offering pt a bed. Marval stated that she would need to reach out to her corporate team to see what they say. Afterwards, Marval called CSW back asking about pt long-term plans once her insurance stop paying for rehab. CSW called son and son stated that patient would need to spend down if it came to that but for right now he wanted to know how she would do with therapy and if she does well she may can return back home. CSW shared information with Marval and awaiting for her response on offering pt a bed. TOC will continue to follow.

## 2023-10-08 NOTE — ED Notes (Signed)
Pt resting called to sitters outside door for something to drink, no problem or concerns noted at this time.

## 2023-10-09 DIAGNOSIS — E785 Hyperlipidemia, unspecified: Secondary | ICD-10-CM | POA: Diagnosis not present

## 2023-10-09 DIAGNOSIS — R279 Unspecified lack of coordination: Secondary | ICD-10-CM | POA: Diagnosis not present

## 2023-10-09 DIAGNOSIS — I6782 Cerebral ischemia: Secondary | ICD-10-CM | POA: Diagnosis not present

## 2023-10-09 DIAGNOSIS — F05 Delirium due to known physiological condition: Secondary | ICD-10-CM | POA: Diagnosis not present

## 2023-10-09 DIAGNOSIS — F4322 Adjustment disorder with anxiety: Secondary | ICD-10-CM | POA: Diagnosis not present

## 2023-10-09 DIAGNOSIS — F03918 Unspecified dementia, unspecified severity, with other behavioral disturbance: Secondary | ICD-10-CM | POA: Diagnosis not present

## 2023-10-09 DIAGNOSIS — R2681 Unsteadiness on feet: Secondary | ICD-10-CM | POA: Diagnosis not present

## 2023-10-09 DIAGNOSIS — N183 Chronic kidney disease, stage 3 unspecified: Secondary | ICD-10-CM | POA: Diagnosis not present

## 2023-10-09 DIAGNOSIS — E039 Hypothyroidism, unspecified: Secondary | ICD-10-CM | POA: Diagnosis not present

## 2023-10-09 DIAGNOSIS — R41840 Attention and concentration deficit: Secondary | ICD-10-CM | POA: Diagnosis not present

## 2023-10-09 DIAGNOSIS — R079 Chest pain, unspecified: Secondary | ICD-10-CM | POA: Diagnosis not present

## 2023-10-09 DIAGNOSIS — Z20822 Contact with and (suspected) exposure to covid-19: Secondary | ICD-10-CM | POA: Diagnosis not present

## 2023-10-09 DIAGNOSIS — E782 Mixed hyperlipidemia: Secondary | ICD-10-CM | POA: Diagnosis not present

## 2023-10-09 DIAGNOSIS — F0390 Unspecified dementia without behavioral disturbance: Secondary | ICD-10-CM | POA: Diagnosis not present

## 2023-10-09 DIAGNOSIS — Z743 Need for continuous supervision: Secondary | ICD-10-CM | POA: Diagnosis not present

## 2023-10-09 DIAGNOSIS — R4182 Altered mental status, unspecified: Secondary | ICD-10-CM | POA: Diagnosis not present

## 2023-10-09 DIAGNOSIS — I10 Essential (primary) hypertension: Secondary | ICD-10-CM | POA: Diagnosis not present

## 2023-10-09 DIAGNOSIS — R2689 Other abnormalities of gait and mobility: Secondary | ICD-10-CM | POA: Diagnosis not present

## 2023-10-09 DIAGNOSIS — I498 Other specified cardiac arrhythmias: Secondary | ICD-10-CM | POA: Diagnosis not present

## 2023-10-09 DIAGNOSIS — R531 Weakness: Secondary | ICD-10-CM | POA: Diagnosis not present

## 2023-10-09 DIAGNOSIS — M6281 Muscle weakness (generalized): Secondary | ICD-10-CM | POA: Diagnosis not present

## 2023-10-09 NOTE — ED Notes (Signed)
 CSW spoke with [facility] at [facility] and she stated that they can offer patient a bed. Patient Brittany Holden was approved and obtained today. PASRR was received and provided to [facility]. Son is already at facility signing paperwork. Nurse made aware of room number A4-1 and call report number, [phone]. MD made aware to update AVS.TOC signing off.

## 2023-10-09 NOTE — Progress Notes (Signed)
 Physical Therapy Treatment Patient Details Name: Brittany Holden MRN: [id number] DOB: 09-Aug-1930 Today's Date: 10/09/2023   History of Present Illness Pt presents with confusion. Son indicates saw pt two days ago and seemed more or less herself then - indicates at baseline, some issues with memory, but lives independently, attends to ADLs, etc. Yesterday, patient call him 20+ times which is unusual. Today, went to check on patient and she was very confused, not able to carry on conversation, seemed very confused, was sitting down but not wearing pants and wearing only one shoe, money and pocket book possessions scattered across floor, etc. .  Pt had fall three weeks ago - no known injury then. No new or recent fall known. Pt had recently c/o headache but denies currently. Pt denies any specific physical pain or complaint currently, but is confused/very limited historian - level 5 caveat. No report of fevers. No report of change in medication.    PT Comments  Patient was agreeable to therapy. Was able to perform all tasks with min assist from PT. Required extra cueing as patient tried to perform movements before PT was ready. Used RW for transfers and ambulation with min assist. Verbal/tactile cueing to keep RW close during ambulation. Patient left in bed at conclusion of session. Patient will benefit from continued skilled physical therapy in hospital and recommended venue below to increase strength, balance, endurance for safe ADLs and gait.      If plan is discharge home, recommend the following: A lot of help with walking and/or transfers;A lot of help with bathing/dressing/bathroom;Assistance with cooking/housework;Help with stairs or ramp for entrance   Can travel by private vehicle        Equipment Recommendations  None recommended by PT    Recommendations for Other Services       Precautions / Restrictions Precautions Precautions: Fall Restrictions Weight Bearing Restrictions Per  Provider Order: No     Mobility  Bed Mobility Overal bed mobility: Needs Assistance Bed Mobility: Supine to Sit     Supine to sit: Min assist          Transfers Overall transfer level: Needs assistance Equipment used: Rolling walker (2 wheels) Transfers: Sit to/from Stand, Bed to chair/wheelchair/BSC Sit to Stand: Min assist   Step pivot transfers: Min assist            Ambulation/Gait Ambulation/Gait assistance: Min assist Gait Distance (Feet): 35 Feet Assistive device: Rolling walker (2 wheels) Gait Pattern/deviations: Step-to pattern, Decreased step length - right, Decreased step length - left Gait velocity: slow     General Gait Details: slow, unsteady and heavy reliance on RW   Stairs             Wheelchair Mobility     Tilt Bed    Modified Rankin (Stroke Patients Only)       Balance Overall balance assessment: Needs assistance Sitting-balance support: Bilateral upper extremity supported, Feet supported Sitting balance-Leahy Scale: Fair     Standing balance support: Bilateral upper extremity supported, Reliant on assistive device for balance Standing balance-Leahy Scale: Fair                              Cognition Arousal: Alert Behavior During Therapy: Impulsive Overall Cognitive Status: Impaired/Different from baseline Area of Impairment: Following commands                       Following Commands: Follows  one step commands consistently                Exercises General Exercises - Lower Extremity Long Arc Quad: Seated, AROM, Both, 10 reps Hip Flexion/Marching: Seated, AROM, Both, 10 reps Toe Raises: Seated, AROM, Both, 10 reps Heel Raises: Seated, AROM, Both, 10 reps    General Comments        Pertinent Vitals/Pain Pain Assessment Pain Assessment: No/denies pain    Home Living                          Prior Function            PT Goals (current goals can now be found in the  care plan section) Acute Rehab PT Goals Patient Stated Goal: to return home PT Goal Formulation: With patient Time For Goal Achievement: 10/17/23 Potential to Achieve Goals: Good Progress towards PT goals: Progressing toward goals    Frequency    Min 2X/week      PT Plan      Co-evaluation              AM-PAC PT 6 Clicks Mobility   Outcome Measure  Help needed turning from your back to your side while in a flat bed without using bedrails?: A Little Help needed moving from lying on your back to sitting on the side of a flat bed without using bedrails?: A Little Help needed moving to and from a bed to a chair (including a wheelchair)?: A Little Help needed standing up from a chair using your arms (e.g., wheelchair or bedside chair)?: A Little Help needed to walk in hospital room?: A Lot Help needed climbing 3-5 steps with a railing? : Total 6 Click Score: 15    End of Session Equipment Utilized During Treatment: Gait belt Activity Tolerance: Patient tolerated treatment well Patient left: in bed;with call bell/phone within reach;with nursing/sitter in room;with family/visitor present Nurse Communication: Mobility status PT Visit Diagnosis: Unsteadiness on feet (R26.81);Other abnormalities of gait and mobility (R26.89);Muscle weakness (generalized) (M62.81)     Time: 8949-8883 PT Time Calculation (min) (ACUTE ONLY): 26 min  Charges:    $Therapeutic Exercise: 8-22 mins $Therapeutic Activity: 8-22 mins PT General Charges $$ ACUTE PT VISIT: 1 Visit                     Lamel Mccarley SPT

## 2023-10-09 NOTE — Discharge Instructions (Addendum)
Please follow-up with your primary doctor in 2-3 days.

## 2023-10-09 NOTE — ED Notes (Signed)
 Pt is resting, laying in bed with even chest rise and fall.

## 2023-10-09 NOTE — ED Provider Notes (Signed)
 Emergency Medicine Observation Re-evaluation Note  Brittany Holden is a 89 y.o. female, seen on rounds today.  Pt initially presented to the ED for complaints of Altered Mental Status Currently, the patient is not having acute complaints.  Physical Exam  BP 135/76 (BP Location: Left Arm)   Pulse 71   Temp 97.8 F (36.6 C) (Oral)   Resp 16   Ht 6' (1.829 m)   Wt 59 kg   SpO2 98%   BMI 17.63 kg/m  Physical Exam General: Sitting on stretcher talking to family member Lungs: Normal work of breathing Psych: Calm and cooperative  ED Course / MDM  EKG:EKG Interpretation Date/Time:  Tuesday October 01 2023 19:55:36 EST Ventricular Rate:  98 PR Interval:  142 QRS Duration:  90 QT Interval:  335 QTC Calculation: 428 R Axis:   -39  Text Interpretation: Sinus arrhythmia Multiple premature complexes, vent & supraven Probable left ventricular hypertrophy When compared with ECG of 05/15/2021, No significant change was found Confirmed by Raford Lenis ([id number]) on 10/02/2023 2:13:51 AM  I have reviewed the labs performed to date as well as medications administered while in observation.  Recent changes in the last 24 hours include no acute events.  Plan  Current plan is for placement.    Yolande Lamar BROCKS, MD 10/09/23 [phone]

## 2023-10-10 DIAGNOSIS — E785 Hyperlipidemia, unspecified: Secondary | ICD-10-CM | POA: Diagnosis not present

## 2023-10-10 DIAGNOSIS — R531 Weakness: Secondary | ICD-10-CM | POA: Diagnosis not present

## 2023-10-10 DIAGNOSIS — I10 Essential (primary) hypertension: Secondary | ICD-10-CM | POA: Diagnosis not present

## 2023-10-10 DIAGNOSIS — F0390 Unspecified dementia without behavioral disturbance: Secondary | ICD-10-CM | POA: Diagnosis not present

## 2023-10-11 DIAGNOSIS — F03918 Unspecified dementia, unspecified severity, with other behavioral disturbance: Secondary | ICD-10-CM | POA: Diagnosis not present

## 2023-10-11 DIAGNOSIS — I10 Essential (primary) hypertension: Secondary | ICD-10-CM | POA: Diagnosis not present

## 2023-10-11 DIAGNOSIS — E782 Mixed hyperlipidemia: Secondary | ICD-10-CM | POA: Diagnosis not present

## 2023-10-11 DIAGNOSIS — R531 Weakness: Secondary | ICD-10-CM | POA: Diagnosis not present

## 2023-10-11 DIAGNOSIS — E039 Hypothyroidism, unspecified: Secondary | ICD-10-CM | POA: Diagnosis not present

## 2023-10-14 DIAGNOSIS — I10 Essential (primary) hypertension: Secondary | ICD-10-CM | POA: Diagnosis not present

## 2023-10-14 DIAGNOSIS — E782 Mixed hyperlipidemia: Secondary | ICD-10-CM | POA: Diagnosis not present

## 2023-10-14 DIAGNOSIS — E039 Hypothyroidism, unspecified: Secondary | ICD-10-CM | POA: Diagnosis not present

## 2023-10-14 DIAGNOSIS — F03918 Unspecified dementia, unspecified severity, with other behavioral disturbance: Secondary | ICD-10-CM | POA: Diagnosis not present

## 2023-10-14 DIAGNOSIS — R531 Weakness: Secondary | ICD-10-CM | POA: Diagnosis not present

## 2023-10-16 DIAGNOSIS — E039 Hypothyroidism, unspecified: Secondary | ICD-10-CM | POA: Diagnosis not present

## 2023-10-16 DIAGNOSIS — R531 Weakness: Secondary | ICD-10-CM | POA: Diagnosis not present

## 2023-10-16 DIAGNOSIS — E782 Mixed hyperlipidemia: Secondary | ICD-10-CM | POA: Diagnosis not present

## 2023-10-16 DIAGNOSIS — F03918 Unspecified dementia, unspecified severity, with other behavioral disturbance: Secondary | ICD-10-CM | POA: Diagnosis not present

## 2023-10-16 DIAGNOSIS — I10 Essential (primary) hypertension: Secondary | ICD-10-CM | POA: Diagnosis not present

## 2023-10-18 DIAGNOSIS — R531 Weakness: Secondary | ICD-10-CM | POA: Diagnosis not present

## 2023-10-18 DIAGNOSIS — E782 Mixed hyperlipidemia: Secondary | ICD-10-CM | POA: Diagnosis not present

## 2023-10-18 DIAGNOSIS — F03918 Unspecified dementia, unspecified severity, with other behavioral disturbance: Secondary | ICD-10-CM | POA: Diagnosis not present

## 2023-10-18 DIAGNOSIS — E039 Hypothyroidism, unspecified: Secondary | ICD-10-CM | POA: Diagnosis not present

## 2023-10-18 DIAGNOSIS — I10 Essential (primary) hypertension: Secondary | ICD-10-CM | POA: Diagnosis not present

## 2023-10-21 DIAGNOSIS — E039 Hypothyroidism, unspecified: Secondary | ICD-10-CM | POA: Diagnosis not present

## 2023-10-21 DIAGNOSIS — E782 Mixed hyperlipidemia: Secondary | ICD-10-CM | POA: Diagnosis not present

## 2023-10-21 DIAGNOSIS — F03918 Unspecified dementia, unspecified severity, with other behavioral disturbance: Secondary | ICD-10-CM | POA: Diagnosis not present

## 2023-10-21 DIAGNOSIS — R531 Weakness: Secondary | ICD-10-CM | POA: Diagnosis not present

## 2023-10-21 DIAGNOSIS — I10 Essential (primary) hypertension: Secondary | ICD-10-CM | POA: Diagnosis not present

## 2023-10-21 DIAGNOSIS — F4322 Adjustment disorder with anxiety: Secondary | ICD-10-CM | POA: Diagnosis not present

## 2023-10-22 DIAGNOSIS — F03918 Unspecified dementia, unspecified severity, with other behavioral disturbance: Secondary | ICD-10-CM | POA: Diagnosis not present

## 2023-10-22 DIAGNOSIS — R531 Weakness: Secondary | ICD-10-CM | POA: Diagnosis not present

## 2023-10-22 DIAGNOSIS — I10 Essential (primary) hypertension: Secondary | ICD-10-CM | POA: Diagnosis not present

## 2023-10-22 DIAGNOSIS — E782 Mixed hyperlipidemia: Secondary | ICD-10-CM | POA: Diagnosis not present

## 2023-10-22 DIAGNOSIS — E039 Hypothyroidism, unspecified: Secondary | ICD-10-CM | POA: Diagnosis not present

## 2023-10-27 DIAGNOSIS — F0390 Unspecified dementia without behavioral disturbance: Secondary | ICD-10-CM | POA: Diagnosis not present

## 2023-10-27 DIAGNOSIS — I10 Essential (primary) hypertension: Secondary | ICD-10-CM | POA: Diagnosis not present

## 2023-10-27 DIAGNOSIS — Z9181 History of falling: Secondary | ICD-10-CM | POA: Diagnosis not present

## 2023-10-27 DIAGNOSIS — E785 Hyperlipidemia, unspecified: Secondary | ICD-10-CM | POA: Diagnosis not present

## 2023-10-27 DIAGNOSIS — R1310 Dysphagia, unspecified: Secondary | ICD-10-CM | POA: Diagnosis not present

## 2023-10-27 DIAGNOSIS — Z556 Problems related to health literacy: Secondary | ICD-10-CM | POA: Diagnosis not present

## 2023-10-27 DIAGNOSIS — R32 Unspecified urinary incontinence: Secondary | ICD-10-CM | POA: Diagnosis not present

## 2023-10-27 DIAGNOSIS — Z7952 Long term (current) use of systemic steroids: Secondary | ICD-10-CM | POA: Diagnosis not present

## 2023-11-05 DIAGNOSIS — Z556 Problems related to health literacy: Secondary | ICD-10-CM | POA: Diagnosis not present

## 2023-11-05 DIAGNOSIS — R32 Unspecified urinary incontinence: Secondary | ICD-10-CM | POA: Diagnosis not present

## 2023-11-05 DIAGNOSIS — Z9181 History of falling: Secondary | ICD-10-CM | POA: Diagnosis not present

## 2023-11-05 DIAGNOSIS — R1310 Dysphagia, unspecified: Secondary | ICD-10-CM | POA: Diagnosis not present

## 2023-11-05 DIAGNOSIS — I10 Essential (primary) hypertension: Secondary | ICD-10-CM | POA: Diagnosis not present

## 2023-11-05 DIAGNOSIS — Z7952 Long term (current) use of systemic steroids: Secondary | ICD-10-CM | POA: Diagnosis not present

## 2023-11-05 DIAGNOSIS — F0390 Unspecified dementia without behavioral disturbance: Secondary | ICD-10-CM | POA: Diagnosis not present

## 2023-11-05 DIAGNOSIS — E785 Hyperlipidemia, unspecified: Secondary | ICD-10-CM | POA: Diagnosis not present

## 2023-11-08 DIAGNOSIS — E875 Hyperkalemia: Secondary | ICD-10-CM | POA: Diagnosis not present

## 2023-11-08 DIAGNOSIS — I129 Hypertensive chronic kidney disease with stage 1 through stage 4 chronic kidney disease, or unspecified chronic kidney disease: Secondary | ICD-10-CM | POA: Diagnosis not present

## 2023-11-08 DIAGNOSIS — F5105 Insomnia due to other mental disorder: Secondary | ICD-10-CM | POA: Diagnosis not present

## 2023-11-08 DIAGNOSIS — F0390 Unspecified dementia without behavioral disturbance: Secondary | ICD-10-CM | POA: Diagnosis not present

## 2023-11-08 DIAGNOSIS — M5442 Lumbago with sciatica, left side: Secondary | ICD-10-CM | POA: Diagnosis not present

## 2023-11-08 DIAGNOSIS — R600 Localized edema: Secondary | ICD-10-CM | POA: Diagnosis not present

## 2023-11-08 DIAGNOSIS — N1832 Chronic kidney disease, stage 3b: Secondary | ICD-10-CM | POA: Diagnosis not present

## 2023-11-08 DIAGNOSIS — E039 Hypothyroidism, unspecified: Secondary | ICD-10-CM | POA: Diagnosis not present

## 2023-11-08 DIAGNOSIS — E782 Mixed hyperlipidemia: Secondary | ICD-10-CM | POA: Diagnosis not present

## 2023-11-08 DIAGNOSIS — I10 Essential (primary) hypertension: Secondary | ICD-10-CM | POA: Diagnosis not present

## 2023-11-11 DIAGNOSIS — Z9181 History of falling: Secondary | ICD-10-CM | POA: Diagnosis not present

## 2023-11-11 DIAGNOSIS — F0390 Unspecified dementia without behavioral disturbance: Secondary | ICD-10-CM | POA: Diagnosis not present

## 2023-11-11 DIAGNOSIS — I10 Essential (primary) hypertension: Secondary | ICD-10-CM | POA: Diagnosis not present

## 2023-11-11 DIAGNOSIS — R1310 Dysphagia, unspecified: Secondary | ICD-10-CM | POA: Diagnosis not present

## 2023-11-11 DIAGNOSIS — Z7952 Long term (current) use of systemic steroids: Secondary | ICD-10-CM | POA: Diagnosis not present

## 2023-11-11 DIAGNOSIS — E785 Hyperlipidemia, unspecified: Secondary | ICD-10-CM | POA: Diagnosis not present

## 2023-11-11 DIAGNOSIS — Z556 Problems related to health literacy: Secondary | ICD-10-CM | POA: Diagnosis not present

## 2023-11-11 DIAGNOSIS — R32 Unspecified urinary incontinence: Secondary | ICD-10-CM | POA: Diagnosis not present

## 2023-11-12 ENCOUNTER — Other Ambulatory Visit: Payer: Self-pay

## 2023-11-12 ENCOUNTER — Emergency Department (HOSPITAL_COMMUNITY)

## 2023-11-12 ENCOUNTER — Encounter (HOSPITAL_COMMUNITY): Payer: Self-pay | Admitting: Emergency Medicine

## 2023-11-12 ENCOUNTER — Emergency Department (HOSPITAL_COMMUNITY)
Admission: EM | Admit: 2023-11-12 | Discharge: 2023-11-12 | Disposition: A | Discharge status: Home / Self Care | Attending: Emergency Medicine | Admitting: Emergency Medicine

## 2023-11-12 DIAGNOSIS — F0390 Unspecified dementia without behavioral disturbance: Secondary | ICD-10-CM | POA: Insufficient documentation

## 2023-11-12 DIAGNOSIS — R609 Edema, unspecified: Secondary | ICD-10-CM | POA: Diagnosis not present

## 2023-11-12 DIAGNOSIS — R279 Unspecified lack of coordination: Secondary | ICD-10-CM | POA: Diagnosis not present

## 2023-11-12 DIAGNOSIS — R600 Localized edema: Secondary | ICD-10-CM

## 2023-11-12 DIAGNOSIS — R531 Weakness: Secondary | ICD-10-CM | POA: Diagnosis not present

## 2023-11-12 DIAGNOSIS — L03116 Cellulitis of left lower limb: Secondary | ICD-10-CM | POA: Diagnosis not present

## 2023-11-12 DIAGNOSIS — I129 Hypertensive chronic kidney disease with stage 1 through stage 4 chronic kidney disease, or unspecified chronic kidney disease: Secondary | ICD-10-CM | POA: Insufficient documentation

## 2023-11-12 DIAGNOSIS — Z7401 Bed confinement status: Secondary | ICD-10-CM | POA: Diagnosis not present

## 2023-11-12 DIAGNOSIS — J90 Pleural effusion, not elsewhere classified: Secondary | ICD-10-CM | POA: Diagnosis not present

## 2023-11-12 DIAGNOSIS — L03011 Cellulitis of right finger: Secondary | ICD-10-CM | POA: Insufficient documentation

## 2023-11-12 DIAGNOSIS — N189 Chronic kidney disease, unspecified: Secondary | ICD-10-CM | POA: Insufficient documentation

## 2023-11-12 DIAGNOSIS — L03119 Cellulitis of unspecified part of limb: Secondary | ICD-10-CM

## 2023-11-12 DIAGNOSIS — I700 Atherosclerosis of aorta: Secondary | ICD-10-CM | POA: Diagnosis not present

## 2023-11-12 HISTORY — DX: Malignant (primary) neoplasm, unspecified: C80.1

## 2023-11-12 LAB — CBC WITH DIFFERENTIAL/PLATELET
Abs Immature Granulocytes: 0.16 10*3/uL — ABNORMAL HIGH (ref 0.00–0.07)
Basophils Absolute: 0 10*3/uL (ref 0.0–0.1)
Basophils Relative: 0 %
Eosinophils Absolute: 0.2 10*3/uL (ref 0.0–0.5)
Eosinophils Relative: 2 %
HCT: 37.7 % (ref 36.0–46.0)
Hemoglobin: 12.1 g/dL (ref 12.0–15.0)
Immature Granulocytes: 2 %
Lymphocytes Relative: 15 %
Lymphs Abs: 1.3 10*3/uL (ref 0.7–4.0)
MCH: 28.4 pg (ref 26.0–34.0)
MCHC: 32.1 g/dL (ref 30.0–36.0)
MCV: 88.5 fL (ref 80.0–100.0)
Monocytes Absolute: 0.5 10*3/uL (ref 0.1–1.0)
Monocytes Relative: 5 %
Neutro Abs: 6.8 10*3/uL (ref 1.7–7.7)
Neutrophils Relative %: 76 %
Platelets: 250 10*3/uL (ref 150–400)
RBC: 4.26 MIL/uL (ref 3.87–5.11)
RDW: 13.3 % (ref 11.5–15.5)
WBC: 9 10*3/uL (ref 4.0–10.5)
nRBC: 0 % (ref 0.0–0.2)

## 2023-11-12 LAB — BASIC METABOLIC PANEL
Anion gap: 9 (ref 5–15)
BUN: 37 mg/dL — ABNORMAL HIGH (ref 8–23)
CO2: 28 mmol/L (ref 22–32)
Calcium: 8.5 mg/dL — ABNORMAL LOW (ref 8.9–10.3)
Chloride: 94 mmol/L — ABNORMAL LOW (ref 98–111)
Creatinine, Ser: 1.55 mg/dL — ABNORMAL HIGH (ref 0.44–1.00)
GFR, Estimated: 31 mL/min — ABNORMAL LOW (ref >60)
Glucose, Bld: 104 mg/dL — ABNORMAL HIGH (ref 70–99)
Potassium: 3.8 mmol/L (ref 3.5–5.1)
Sodium: 131 mmol/L — ABNORMAL LOW (ref 135–145)

## 2023-11-12 LAB — LACTIC ACID, PLASMA: Lactic Acid, Venous: 0.8 mmol/L (ref 0.5–1.9)

## 2023-11-12 LAB — BRAIN NATRIURETIC PEPTIDE: B Natriuretic Peptide: 61 pg/mL (ref 0.0–100.0)

## 2023-11-12 MED ORDER — PROPOFOL 10 MG/ML IV BOLUS
INTRAVENOUS | Status: DC
Start: 2023-11-12 — End: 2023-11-13
  Filled 2023-11-12: qty 20

## 2023-11-12 MED ORDER — CEPHALEXIN 500 MG PO CAPS
500.0000 mg | ORAL_CAPSULE | Freq: Once | ORAL | Status: AC
  Administered 2023-11-12: 500 mg via ORAL
  Filled 2023-11-12: qty 1

## 2023-11-12 MED ORDER — CEPHALEXIN 500 MG PO CAPS: 500.0000 mg | ORAL_CAPSULE | Freq: Four times a day (QID) | ORAL | 0 refills | Status: AC

## 2023-11-12 NOTE — ED Provider Notes (Signed)
  Physical Exam  BP 100/60   Pulse 83   Temp 98.4 F (36.9 C) (Oral)   Resp 15   Ht 6' (1.829 m)   Wt 59 kg   SpO2 96%   BMI 17.64 kg/m   Physical Exam  Procedures  Procedures  ED Course / MDM    Medical Decision Making Amount and/or Complexity of Data Reviewed Labs: ordered. Radiology: ordered.   Patient was signed out to myself at shift change pending blood work and chest x-ray.  Patient has no indication for overt pulmonary edema at this point.  She does have stable vital signs with no associated hypoxia.  She does have findings concerning for possible cellulitis of the left lower extremity and do suspect that the changes on the right lower extremity or venous stasis.  She has no findings to suggest necrotizing fasciitis.  She has intact peripheral pulses with no indication for acute arterial insufficiency.  Will plan for discharge back to her long-term care facility at this time on antibiotics with recommendation for compression stockings.  Images were reviewed with attending physician as well who is in agreement to plan.       Lelon Perla, PA-C 11/12/23 2205    Derwood Kaplan, MD 11/16/23 [phone]

## 2023-11-12 NOTE — ED Triage Notes (Signed)
 Facility sent pt over for lower leg swelling and weeping. Pt has no complaints and suffers from dementia.

## 2023-11-12 NOTE — ED Provider Notes (Signed)
 [place] EMERGENCY DEPARTMENT AT [facility] Provider Note   CSN: [id number] Arrival date & time: 11/12/23  1520     History  Chief Complaint  Patient presents with   Leg Swelling    Brittany Holden is a 89 y.o. female.  HPI     Brittany Holden is a 89 y.o. female with past medical history of dementia, CKD, HTN who presents to the Emergency Department from [facility] assisted living facility for evaluation of swelling, redness and weeping of her bilateral lower legs.    Patient has history of dementia and unable to provide any relevant medical history.  Contacted the facility spoke with caregiver.  Caregiver states she had been off for the last several days and was unable to provide any relevant history as well.  She did state that they have noticed redness of her lower legs for at least 1 week.  Was believed that the redness and weeping of her legs has worsened.  No reported fever  Home Medications Prior to Admission medications   Medication Sig Start Date End Date Taking? Authorizing Provider  amLODipine (NORVASC) 5 MG tablet Take 5 mg by mouth daily.    [provider]  bacitracin ointment Apply 1 Application topically 2 (two) times daily. Patient not taking: Reported on 10/03/2023 12/07/22   Kommor, Wyn Forster, MD  calcitRIOL (ROCALTROL) 0.25 MCG capsule Take 0.25 mcg by mouth 2 (two) times a week. 04/08/23   [provider]  chlorthalidone (HYGROTON) 25 MG tablet Take 25 mg by mouth every other day. 11/02/20 05/05/23  [provider]  Cholecalciferol 25 MCG (1000 UT) tablet Take 400 Units by mouth daily. Patient not taking: Reported on 10/03/2023    [provider]  Cyanocobalamin (VITAMIN B-12 CR PO) Take by mouth. Patient not taking: Reported on 05/04/2022    [provider]  donepezil (ARICEPT) 10 MG tablet Take 10 mg by mouth daily. 08/30/23   [provider]  ferrous sulfate 325 (65 FE) MG tablet Take 325 mg by mouth  daily with breakfast.    [provider]  hydrochlorothiazide (MICROZIDE) 12.5 MG capsule Take 12.5 mg by mouth every morning. Patient not taking: Reported on 10/03/2023 04/08/23   [provider]  levothyroxine (SYNTHROID, LEVOTHROID) 75 MCG tablet Take 75 mcg by mouth daily.      [provider]  lidocaine (LIDODERM) 5 % Place 1 patch onto the skin daily. Remove & Discard patch within 12 hours or as directed by MD Patient not taking: Reported on 10/03/2023 08/20/22   Sherian Maroon A, PA  metoprolol succinate (TOPROL XL) 25 MG 24 hr tablet Take 1 tablet (25 mg total) by mouth daily. 04/20/20   Pricilla Riffle, MD  Multiple Minerals-Vitamins (CALCIUM & VIT D3 BONE HEALTH PO) Take 1 tablet by mouth daily. Patient not taking: Reported on 05/04/2022    [provider]  mupirocin ointment (BACTROBAN) 2 % Apply 1 Application topically daily. Patient not taking: Reported on 10/03/2023 04/12/23   Particia Nearing, PA-C  predniSONE (DELTASONE) 10 MG tablet Take 4 tablets (40 mg total) by mouth daily. Patient not taking: Reported on 10/03/2023 08/20/22   Sherian Maroon A, PA  simvastatin (ZOCOR) 20 MG tablet Take 20 mg by mouth every morning.     [provider]  sodium zirconium cyclosilicate (LOKELMA) 5 g packet Take 10 g by mouth daily.    [provider]      Allergies    Patient  has no known allergies.    Review of Systems   Review of Systems  Unable to perform ROS: Dementia    Physical Exam Updated Vital Signs BP 128/84   Pulse 98   Temp 98.4 F (36.9 C) (Oral)   Resp 17   Ht 6' (1.829 m)   Wt 59 kg   SpO2 94%   BMI 17.64 kg/m  Physical Exam Vitals reviewed.  Constitutional:      Appearance: Normal appearance.  HENT:     Head: Atraumatic.  Cardiovascular:     Rate and Rhythm: Normal rate and regular rhythm.     Pulses: Normal pulses.  Pulmonary:     Effort: Pulmonary effort is normal.  Abdominal:     Palpations: Abdomen  is soft.     Tenderness: There is no abdominal tenderness.  Musculoskeletal:        General: Tenderness present.     Right lower leg: Edema present.     Left lower leg: Edema present.  Skin:    General: Skin is warm.     Capillary Refill: Capillary refill takes less than 2 seconds.     Findings: Erythema present.  Neurological:     Mental Status: She is alert.        ED Results / Procedures / Treatments   Labs (all labs ordered are listed, but only abnormal results are displayed) Labs Reviewed  CBC WITH DIFFERENTIAL/PLATELET - Abnormal; Notable for the following components:      Result Value   Abs Immature Granulocytes 0.16 (*)    All other components within normal limits  BASIC METABOLIC PANEL - Abnormal; Notable for the following components:   Sodium 131 (*)    Chloride 94 (*)    Glucose, Bld 104 (*)    BUN 37 (*)    Creatinine, Ser 1.55 (*)    Calcium 8.5 (*)    GFR, Estimated 31 (*)    All other components within normal limits  LACTIC ACID, PLASMA  BRAIN NATRIURETIC PEPTIDE    EKG EKG Interpretation Date/Time:  Tuesday November 12 2023 15:50:27 EDT Ventricular Rate:  92 PR Interval:  158 QRS Duration:  88 QT Interval:  368 QTC Calculation: 458 R Axis:   -26  Text Interpretation: Sinus rhythm Borderline left axis deviation Borderline low voltage, extremity leads No acute changes No significant change since last tracing Confirmed by Derwood Kaplan [phone]) on 11/12/2023 6:00:44 PM  Radiology No results found.  Procedures Procedures    Medications Ordered in ED Medications - No data to display  ED Course/ Medical Decision Making/ A&P                                 Medical Decision Making Patient here from [place] assisted living facility.  Has history of dementia at baseline.  Patient unable to provide relevant history she was brought in for evaluation of redness swelling and weeping to the bilateral lower extremities reported symptoms for at least 1  week sent in today for evaluation because symptoms worsening.  No reported fever chills  Suspect cellulitis, DVT, CHF exacerbation all considered  Amount and/or Complexity of Data Reviewed Labs: ordered.    Details: No evidence of leukocytosis, BMP unremarkable, chemistries show serum creatinine elevated but near patient's baseline lactic acid reassuring Radiology: ordered.    Details: Chest x-ray pending Discussion of management or test interpretation with external provider(s):  Suspect cellulitis, there  is a skin tear to the left lower extremity as well that has been bandaged.  Extremities are warm to touch, good cap refill  Findings discussed with Huston Foley, PA-C, patient is pending completion of workup.  I suspect discharge back to facility with antibiotics           Final Clinical Impression(s) / ED Diagnoses Final diagnoses:  Cellulitis of lower extremity, unspecified laterality    Rx / DC Orders ED Discharge Orders     None         Pauline Aus, PA-C 11/12/23 1943    Derwood Kaplan, MD 11/16/23 [phone]

## 2023-11-12 NOTE — Discharge Instructions (Signed)
 Please take all antibiotics as directed.  Please consider using compression stockings on a regular basis to help with the edema.  Follow-up closely with primary care doctor on an outpatient basis.  Return to emergency department immediately for any new or worsening symptoms.

## 2023-11-14 DIAGNOSIS — R32 Unspecified urinary incontinence: Secondary | ICD-10-CM | POA: Diagnosis not present

## 2023-11-14 DIAGNOSIS — R1310 Dysphagia, unspecified: Secondary | ICD-10-CM | POA: Diagnosis not present

## 2023-11-14 DIAGNOSIS — I10 Essential (primary) hypertension: Secondary | ICD-10-CM | POA: Diagnosis not present

## 2023-11-14 DIAGNOSIS — Z9181 History of falling: Secondary | ICD-10-CM | POA: Diagnosis not present

## 2023-11-14 DIAGNOSIS — Z556 Problems related to health literacy: Secondary | ICD-10-CM | POA: Diagnosis not present

## 2023-11-14 DIAGNOSIS — F0390 Unspecified dementia without behavioral disturbance: Secondary | ICD-10-CM | POA: Diagnosis not present

## 2023-11-14 DIAGNOSIS — Z7952 Long term (current) use of systemic steroids: Secondary | ICD-10-CM | POA: Diagnosis not present

## 2023-11-14 DIAGNOSIS — E785 Hyperlipidemia, unspecified: Secondary | ICD-10-CM | POA: Diagnosis not present

## 2023-11-16 ENCOUNTER — Other Ambulatory Visit: Payer: Self-pay

## 2023-11-16 ENCOUNTER — Emergency Department (HOSPITAL_COMMUNITY)

## 2023-11-16 ENCOUNTER — Encounter (HOSPITAL_COMMUNITY): Payer: Self-pay

## 2023-11-16 ENCOUNTER — Emergency Department (HOSPITAL_COMMUNITY)
Admission: EM | Admit: 2023-11-16 | Discharge: 2023-11-17 | Disposition: A | Discharge status: Home / Self Care | Attending: Emergency Medicine | Admitting: Emergency Medicine

## 2023-11-16 DIAGNOSIS — N1832 Chronic kidney disease, stage 3b: Secondary | ICD-10-CM | POA: Diagnosis not present

## 2023-11-16 DIAGNOSIS — I129 Hypertensive chronic kidney disease with stage 1 through stage 4 chronic kidney disease, or unspecified chronic kidney disease: Secondary | ICD-10-CM | POA: Diagnosis not present

## 2023-11-16 DIAGNOSIS — D631 Anemia in chronic kidney disease: Secondary | ICD-10-CM | POA: Diagnosis not present

## 2023-11-16 DIAGNOSIS — F02A0 Dementia in other diseases classified elsewhere, mild, without behavioral disturbance, psychotic disturbance, mood disturbance, and anxiety: Secondary | ICD-10-CM | POA: Diagnosis not present

## 2023-11-16 DIAGNOSIS — L03116 Cellulitis of left lower limb: Secondary | ICD-10-CM | POA: Insufficient documentation

## 2023-11-16 DIAGNOSIS — W19XXXA Unspecified fall, initial encounter: Secondary | ICD-10-CM | POA: Diagnosis not present

## 2023-11-16 DIAGNOSIS — R600 Localized edema: Secondary | ICD-10-CM | POA: Diagnosis not present

## 2023-11-16 DIAGNOSIS — I700 Atherosclerosis of aorta: Secondary | ICD-10-CM | POA: Diagnosis not present

## 2023-11-16 DIAGNOSIS — L03119 Cellulitis of unspecified part of limb: Secondary | ICD-10-CM

## 2023-11-16 DIAGNOSIS — F0390 Unspecified dementia without behavioral disturbance: Secondary | ICD-10-CM | POA: Diagnosis not present

## 2023-11-16 DIAGNOSIS — E039 Hypothyroidism, unspecified: Secondary | ICD-10-CM | POA: Diagnosis not present

## 2023-11-16 DIAGNOSIS — G4489 Other headache syndrome: Secondary | ICD-10-CM | POA: Diagnosis not present

## 2023-11-16 DIAGNOSIS — Z043 Encounter for examination and observation following other accident: Secondary | ICD-10-CM | POA: Diagnosis not present

## 2023-11-16 DIAGNOSIS — S81812D Laceration without foreign body, left lower leg, subsequent encounter: Secondary | ICD-10-CM | POA: Diagnosis not present

## 2023-11-16 DIAGNOSIS — R220 Localized swelling, mass and lump, head: Secondary | ICD-10-CM | POA: Diagnosis not present

## 2023-11-16 DIAGNOSIS — L03115 Cellulitis of right lower limb: Secondary | ICD-10-CM | POA: Diagnosis not present

## 2023-11-16 DIAGNOSIS — I6782 Cerebral ischemia: Secondary | ICD-10-CM | POA: Diagnosis not present

## 2023-11-16 DIAGNOSIS — I739 Peripheral vascular disease, unspecified: Secondary | ICD-10-CM | POA: Diagnosis not present

## 2023-11-16 DIAGNOSIS — M47812 Spondylosis without myelopathy or radiculopathy, cervical region: Secondary | ICD-10-CM | POA: Diagnosis not present

## 2023-11-16 DIAGNOSIS — S0990XA Unspecified injury of head, initial encounter: Secondary | ICD-10-CM | POA: Diagnosis not present

## 2023-11-16 DIAGNOSIS — S82002D Unspecified fracture of left patella, subsequent encounter for closed fracture with routine healing: Secondary | ICD-10-CM | POA: Diagnosis not present

## 2023-11-16 DIAGNOSIS — S0093XA Contusion of unspecified part of head, initial encounter: Secondary | ICD-10-CM | POA: Diagnosis not present

## 2023-11-16 DIAGNOSIS — R609 Edema, unspecified: Secondary | ICD-10-CM | POA: Diagnosis not present

## 2023-11-16 DIAGNOSIS — M4802 Spinal stenosis, cervical region: Secondary | ICD-10-CM | POA: Diagnosis not present

## 2023-11-16 HISTORY — DX: Unspecified dementia, unspecified severity, without behavioral disturbance, psychotic disturbance, mood disturbance, and anxiety: F03.90

## 2023-11-16 LAB — CBC WITH DIFFERENTIAL/PLATELET
Abs Immature Granulocytes: 0.08 10*3/uL — ABNORMAL HIGH (ref 0.00–0.07)
Basophils Absolute: 0 10*3/uL (ref 0.0–0.1)
Basophils Relative: 1 %
Eosinophils Absolute: 0.2 10*3/uL (ref 0.0–0.5)
Eosinophils Relative: 3 %
HCT: 35.8 % — ABNORMAL LOW (ref 36.0–46.0)
Hemoglobin: 11.6 g/dL — ABNORMAL LOW (ref 12.0–15.0)
Immature Granulocytes: 1 %
Lymphocytes Relative: 15 %
Lymphs Abs: 0.9 10*3/uL (ref 0.7–4.0)
MCH: 28.8 pg (ref 26.0–34.0)
MCHC: 32.4 g/dL (ref 30.0–36.0)
MCV: 88.8 fL (ref 80.0–100.0)
Monocytes Absolute: 0.5 10*3/uL (ref 0.1–1.0)
Monocytes Relative: 9 %
Neutro Abs: 4.2 10*3/uL (ref 1.7–7.7)
Neutrophils Relative %: 71 %
Platelets: 203 10*3/uL (ref 150–400)
RBC: 4.03 MIL/uL (ref 3.87–5.11)
RDW: 13.1 % (ref 11.5–15.5)
WBC: 5.8 10*3/uL (ref 4.0–10.5)
nRBC: 0 % (ref 0.0–0.2)

## 2023-11-16 NOTE — ED Provider Notes (Signed)
 [place] EMERGENCY DEPARTMENT AT [facility] Provider Note   CSN: [id number] Arrival date & time: 11/16/23  2310     History {Add pertinent medical, surgical, social history, OB history to HPI:1} Chief Complaint  Patient presents with   Brittany Holden is a 89 y.o. female.  Patient sent to emergency department from skilled nursing facility after unwitnessed fall.  Patient reportedly at her baseline, has a history of dementia.  She has no complaints at arrival.  EMS noted hematoma to the back of the head.       Home Medications Prior to Admission medications   Medication Sig Start Date End Date Taking? Authorizing Provider  amLODipine (NORVASC) 5 MG tablet Take 5 mg by mouth daily.    [provider]  bacitracin ointment Apply 1 Application topically 2 (two) times daily. Patient not taking: Reported on 10/03/2023 12/07/22   Kommor, Wyn Forster, MD  calcitRIOL (ROCALTROL) 0.25 MCG capsule Take 0.25 mcg by mouth 2 (two) times a week. 04/08/23   [provider]  cephALEXin (KEFLEX) 500 MG capsule Take 1 capsule (500 mg total) by mouth 4 (four) times daily for 10 days. 11/12/23 11/22/23  Lelon Perla, PA-C  chlorthalidone (HYGROTON) 25 MG tablet Take 25 mg by mouth every other day. 11/02/20 05/05/23  [provider]  Cholecalciferol 25 MCG (1000 UT) tablet Take 400 Units by mouth daily. Patient not taking: Reported on 10/03/2023    [provider]  Cyanocobalamin (VITAMIN B-12 CR PO) Take by mouth. Patient not taking: Reported on 05/04/2022    [provider]  donepezil (ARICEPT) 10 MG tablet Take 10 mg by mouth daily. 08/30/23   [provider]  ferrous sulfate 325 (65 FE) MG tablet Take 325 mg by mouth daily with breakfast.    [provider]  hydrochlorothiazide (MICROZIDE) 12.5 MG capsule Take 12.5 mg by mouth every morning. Patient not taking: Reported on 10/03/2023 04/08/23   [provider]   levothyroxine (SYNTHROID, LEVOTHROID) 75 MCG tablet Take 75 mcg by mouth daily.      [provider]  lidocaine (LIDODERM) 5 % Place 1 patch onto the skin daily. Remove & Discard patch within 12 hours or as directed by MD Patient not taking: Reported on 10/03/2023 08/20/22   Sherian Maroon A, PA  metoprolol succinate (TOPROL XL) 25 MG 24 hr tablet Take 1 tablet (25 mg total) by mouth daily. 04/20/20   Pricilla Riffle, MD  Multiple Minerals-Vitamins (CALCIUM & VIT D3 BONE HEALTH PO) Take 1 tablet by mouth daily. Patient not taking: Reported on 05/04/2022    [provider]  mupirocin ointment (BACTROBAN) 2 % Apply 1 Application topically daily. Patient not taking: Reported on 10/03/2023 04/12/23   Particia Nearing, PA-C  predniSONE (DELTASONE) 10 MG tablet Take 4 tablets (40 mg total) by mouth daily. Patient not taking: Reported on 10/03/2023 08/20/22   Sherian Maroon A, PA  simvastatin (ZOCOR) 20 MG tablet Take 20 mg by mouth every morning.     [provider]  sodium zirconium cyclosilicate (LOKELMA) 5 g packet Take 10 g by mouth daily.    [provider]      Allergies    Patient has no known allergies.    Review of Systems   Review of Systems  Physical Exam Updated Vital Signs Ht 6' (1.829 m)   Wt 59 kg   BMI 17.63 kg/m  Physical Exam Vitals and nursing note reviewed.  Constitutional:      General: She is not in acute distress.    Appearance: She is well-developed.  HENT:     Head: Normocephalic. Contusion present.      Mouth/Throat:     Mouth: Mucous membranes are moist.  Eyes:     General: Vision grossly intact. Gaze aligned appropriately.     Extraocular Movements: Extraocular movements intact.     Conjunctiva/sclera: Conjunctivae normal.  Cardiovascular:     Rate and Rhythm: Normal rate and regular rhythm.     Pulses: Normal pulses.     Heart sounds: Normal heart sounds, S1 normal and S2 normal. No murmur heard.    No friction  rub. No gallop.  Pulmonary:     Effort: Pulmonary effort is normal. No respiratory distress.     Breath sounds: Normal breath sounds.  Abdominal:     General: Bowel sounds are normal.     Palpations: Abdomen is soft.     Tenderness: There is no abdominal tenderness. There is no guarding or rebound.     Hernia: No hernia is present.  Musculoskeletal:        General: No swelling.     Cervical back: Full passive range of motion without pain, normal range of motion and neck supple. No spinous process tenderness or muscular tenderness. Normal range of motion.     Right lower leg: No edema.     Left lower leg: No edema.  Skin:    General: Skin is warm and dry.     Capillary Refill: Capillary refill takes less than 2 seconds.     Findings: No ecchymosis, erythema, rash or wound.  Neurological:     General: No focal deficit present.     Mental Status: She is alert. Mental status is at baseline. She is disoriented and confused.     Cranial Nerves: Cranial nerves 2-12 are intact.     Sensory: Sensation is intact.     Motor: Motor function is intact.     Coordination: Coordination is intact.  Psychiatric:        Attention and Perception: Attention normal.        Mood and Affect: Mood normal.        Speech: Speech normal.        Behavior: Behavior normal.     ED Results / Procedures / Treatments   Labs (all labs ordered are listed, but only abnormal results are displayed) Labs Reviewed  CBC WITH DIFFERENTIAL/PLATELET  BASIC METABOLIC PANEL  BRAIN NATRIURETIC PEPTIDE  LACTIC ACID, PLASMA    EKG None  Radiology No results found.  Procedures Procedures  {Document cardiac monitor, telemetry assessment procedure when appropriate:1}  Medications Ordered in ED Medications - No data to display  ED Course/ Medical Decision Making/ A&P   {   Click here for ABCD2, HEART and other calculatorsREFRESH Note before signing :1}                              Medical Decision  Making Amount and/or Complexity of Data Reviewed Labs: ordered. Radiology: ordered.   ***  {Document critical care time when appropriate:1} {Document review of labs and clinical decision tools ie heart score, Chads2Vasc2 etc:1}  {Document your independent review of radiology images, and any outside records:1} {Document your discussion with family members, caretakers, and with consultants:1} {Document social determinants of health affecting pt's care:1} {Document your decision making why or why not admission,  treatments were needed:1} Final Clinical Impression(s) / ED Diagnoses Final diagnoses:  None    Rx / DC Orders ED Discharge Orders     None

## 2023-11-16 NOTE — ED Triage Notes (Signed)
 Pt with unwitnessed fall, not on blood thinners, hematoma to back of head. Dementia at baseline, able to state name and DOB. In c-collar on arrival. Bilateral leg swelling and weeping, c/o pain posterior head and back. Pollina EDP eval on arrival.

## 2023-11-17 DIAGNOSIS — R1310 Dysphagia, unspecified: Secondary | ICD-10-CM | POA: Diagnosis not present

## 2023-11-17 DIAGNOSIS — Z7952 Long term (current) use of systemic steroids: Secondary | ICD-10-CM | POA: Diagnosis not present

## 2023-11-17 DIAGNOSIS — F0390 Unspecified dementia without behavioral disturbance: Secondary | ICD-10-CM | POA: Diagnosis not present

## 2023-11-17 DIAGNOSIS — Z556 Problems related to health literacy: Secondary | ICD-10-CM | POA: Diagnosis not present

## 2023-11-17 DIAGNOSIS — Z9181 History of falling: Secondary | ICD-10-CM | POA: Diagnosis not present

## 2023-11-17 DIAGNOSIS — I10 Essential (primary) hypertension: Secondary | ICD-10-CM | POA: Diagnosis not present

## 2023-11-17 DIAGNOSIS — E785 Hyperlipidemia, unspecified: Secondary | ICD-10-CM | POA: Diagnosis not present

## 2023-11-17 DIAGNOSIS — R32 Unspecified urinary incontinence: Secondary | ICD-10-CM | POA: Diagnosis not present

## 2023-11-17 LAB — BASIC METABOLIC PANEL
Anion gap: 10 (ref 5–15)
BUN: 39 mg/dL — ABNORMAL HIGH (ref 8–23)
CO2: 30 mmol/L (ref 22–32)
Calcium: 8.5 mg/dL — ABNORMAL LOW (ref 8.9–10.3)
Chloride: 92 mmol/L — ABNORMAL LOW (ref 98–111)
Creatinine, Ser: 1.74 mg/dL — ABNORMAL HIGH (ref 0.44–1.00)
GFR, Estimated: 27 mL/min — ABNORMAL LOW (ref >60)
Glucose, Bld: 118 mg/dL — ABNORMAL HIGH (ref 70–99)
Potassium: 3.5 mmol/L (ref 3.5–5.1)
Sodium: 132 mmol/L — ABNORMAL LOW (ref 135–145)

## 2023-11-17 LAB — BRAIN NATRIURETIC PEPTIDE: B Natriuretic Peptide: 46 pg/mL (ref 0.0–100.0)

## 2023-11-17 LAB — LACTIC ACID, PLASMA: Lactic Acid, Venous: 1.3 mmol/L (ref 0.5–1.9)

## 2023-11-17 MED ORDER — DOXYCYCLINE HYCLATE 100 MG PO CAPS: 100.0000 mg | ORAL_CAPSULE | Freq: Two times a day (BID) | ORAL | 0 refills | Status: DC

## 2023-11-17 NOTE — ED Notes (Addendum)
 Upon arrival to ED from [facility] pts pants and socks were changed due to being soaked for the pts legs weeping. Pt also had a brief change and it was noted that the pt had on two briefs that were urine soaked.

## 2023-11-17 NOTE — ED Notes (Signed)
 This RN called to tell the pts POA [name] made him aware of the pts Dx, treatment, and Discharge information.

## 2023-11-17 NOTE — ED Notes (Signed)
 Patient taken to restroom via wheelchair. Patient able to void at this time. Patient returned to bed

## 2023-11-17 NOTE — ED Notes (Signed)
 This RN made [employer] aware that the pt can take both Cephalexin and Doxcycline together for her cellulitis. Pts Son who is her POA was concerned about both been taking at the same time.

## 2023-11-18 ENCOUNTER — Other Ambulatory Visit (HOSPITAL_COMMUNITY): Payer: Self-pay | Admitting: Internal Medicine

## 2023-11-18 DIAGNOSIS — Z9181 History of falling: Secondary | ICD-10-CM | POA: Diagnosis not present

## 2023-11-18 DIAGNOSIS — Z556 Problems related to health literacy: Secondary | ICD-10-CM | POA: Diagnosis not present

## 2023-11-18 DIAGNOSIS — R32 Unspecified urinary incontinence: Secondary | ICD-10-CM | POA: Diagnosis not present

## 2023-11-18 DIAGNOSIS — I700 Atherosclerosis of aorta: Secondary | ICD-10-CM

## 2023-11-18 DIAGNOSIS — I10 Essential (primary) hypertension: Secondary | ICD-10-CM | POA: Diagnosis not present

## 2023-11-18 DIAGNOSIS — R1310 Dysphagia, unspecified: Secondary | ICD-10-CM | POA: Diagnosis not present

## 2023-11-18 DIAGNOSIS — F0390 Unspecified dementia without behavioral disturbance: Secondary | ICD-10-CM | POA: Diagnosis not present

## 2023-11-18 DIAGNOSIS — Z7952 Long term (current) use of systemic steroids: Secondary | ICD-10-CM | POA: Diagnosis not present

## 2023-11-18 DIAGNOSIS — E785 Hyperlipidemia, unspecified: Secondary | ICD-10-CM | POA: Diagnosis not present

## 2023-11-19 DIAGNOSIS — Z7952 Long term (current) use of systemic steroids: Secondary | ICD-10-CM | POA: Diagnosis not present

## 2023-11-19 DIAGNOSIS — R32 Unspecified urinary incontinence: Secondary | ICD-10-CM | POA: Diagnosis not present

## 2023-11-19 DIAGNOSIS — Z9181 History of falling: Secondary | ICD-10-CM | POA: Diagnosis not present

## 2023-11-19 DIAGNOSIS — Z556 Problems related to health literacy: Secondary | ICD-10-CM | POA: Diagnosis not present

## 2023-11-19 DIAGNOSIS — R1310 Dysphagia, unspecified: Secondary | ICD-10-CM | POA: Diagnosis not present

## 2023-11-19 DIAGNOSIS — E785 Hyperlipidemia, unspecified: Secondary | ICD-10-CM | POA: Diagnosis not present

## 2023-11-19 DIAGNOSIS — F0390 Unspecified dementia without behavioral disturbance: Secondary | ICD-10-CM | POA: Diagnosis not present

## 2023-11-19 DIAGNOSIS — I10 Essential (primary) hypertension: Secondary | ICD-10-CM | POA: Diagnosis not present

## 2023-11-21 DIAGNOSIS — I129 Hypertensive chronic kidney disease with stage 1 through stage 4 chronic kidney disease, or unspecified chronic kidney disease: Secondary | ICD-10-CM | POA: Diagnosis not present

## 2023-11-21 DIAGNOSIS — I700 Atherosclerosis of aorta: Secondary | ICD-10-CM | POA: Diagnosis not present

## 2023-11-21 DIAGNOSIS — S81812D Laceration without foreign body, left lower leg, subsequent encounter: Secondary | ICD-10-CM | POA: Diagnosis not present

## 2023-11-21 DIAGNOSIS — I739 Peripheral vascular disease, unspecified: Secondary | ICD-10-CM | POA: Diagnosis not present

## 2023-11-21 DIAGNOSIS — F02A0 Dementia in other diseases classified elsewhere, mild, without behavioral disturbance, psychotic disturbance, mood disturbance, and anxiety: Secondary | ICD-10-CM | POA: Diagnosis not present

## 2023-11-21 DIAGNOSIS — D631 Anemia in chronic kidney disease: Secondary | ICD-10-CM | POA: Diagnosis not present

## 2023-11-21 DIAGNOSIS — N1832 Chronic kidney disease, stage 3b: Secondary | ICD-10-CM | POA: Diagnosis not present

## 2023-11-21 DIAGNOSIS — E039 Hypothyroidism, unspecified: Secondary | ICD-10-CM | POA: Diagnosis not present

## 2023-11-21 DIAGNOSIS — S82002D Unspecified fracture of left patella, subsequent encounter for closed fracture with routine healing: Secondary | ICD-10-CM | POA: Diagnosis not present

## 2023-11-22 DIAGNOSIS — E785 Hyperlipidemia, unspecified: Secondary | ICD-10-CM | POA: Diagnosis not present

## 2023-11-22 DIAGNOSIS — Z556 Problems related to health literacy: Secondary | ICD-10-CM | POA: Diagnosis not present

## 2023-11-22 DIAGNOSIS — Z7952 Long term (current) use of systemic steroids: Secondary | ICD-10-CM | POA: Diagnosis not present

## 2023-11-22 DIAGNOSIS — R32 Unspecified urinary incontinence: Secondary | ICD-10-CM | POA: Diagnosis not present

## 2023-11-22 DIAGNOSIS — R1310 Dysphagia, unspecified: Secondary | ICD-10-CM | POA: Diagnosis not present

## 2023-11-22 DIAGNOSIS — Z9181 History of falling: Secondary | ICD-10-CM | POA: Diagnosis not present

## 2023-11-22 DIAGNOSIS — F0390 Unspecified dementia without behavioral disturbance: Secondary | ICD-10-CM | POA: Diagnosis not present

## 2023-11-22 DIAGNOSIS — I10 Essential (primary) hypertension: Secondary | ICD-10-CM | POA: Diagnosis not present

## 2023-11-25 ENCOUNTER — Ambulatory Visit (HOSPITAL_COMMUNITY)
Admission: RE | Admit: 2023-11-25 | Discharge: 2023-11-25 | Disposition: A | Discharge status: Home / Self Care | Source: Ambulatory Visit | Attending: Internal Medicine | Admitting: Internal Medicine

## 2023-11-25 DIAGNOSIS — F02A0 Dementia in other diseases classified elsewhere, mild, without behavioral disturbance, psychotic disturbance, mood disturbance, and anxiety: Secondary | ICD-10-CM | POA: Diagnosis not present

## 2023-11-25 DIAGNOSIS — S82002D Unspecified fracture of left patella, subsequent encounter for closed fracture with routine healing: Secondary | ICD-10-CM | POA: Diagnosis not present

## 2023-11-25 DIAGNOSIS — N1832 Chronic kidney disease, stage 3b: Secondary | ICD-10-CM | POA: Diagnosis not present

## 2023-11-25 DIAGNOSIS — E039 Hypothyroidism, unspecified: Secondary | ICD-10-CM | POA: Diagnosis not present

## 2023-11-25 DIAGNOSIS — I129 Hypertensive chronic kidney disease with stage 1 through stage 4 chronic kidney disease, or unspecified chronic kidney disease: Secondary | ICD-10-CM | POA: Diagnosis not present

## 2023-11-25 DIAGNOSIS — I700 Atherosclerosis of aorta: Secondary | ICD-10-CM | POA: Diagnosis not present

## 2023-11-25 DIAGNOSIS — S81812D Laceration without foreign body, left lower leg, subsequent encounter: Secondary | ICD-10-CM | POA: Diagnosis not present

## 2023-11-25 DIAGNOSIS — D631 Anemia in chronic kidney disease: Secondary | ICD-10-CM | POA: Diagnosis not present

## 2023-11-25 DIAGNOSIS — I739 Peripheral vascular disease, unspecified: Secondary | ICD-10-CM | POA: Diagnosis not present

## 2023-11-26 DIAGNOSIS — E785 Hyperlipidemia, unspecified: Secondary | ICD-10-CM | POA: Diagnosis not present

## 2023-11-26 DIAGNOSIS — Z556 Problems related to health literacy: Secondary | ICD-10-CM | POA: Diagnosis not present

## 2023-11-26 DIAGNOSIS — Z7952 Long term (current) use of systemic steroids: Secondary | ICD-10-CM | POA: Diagnosis not present

## 2023-11-26 DIAGNOSIS — I10 Essential (primary) hypertension: Secondary | ICD-10-CM | POA: Diagnosis not present

## 2023-11-26 DIAGNOSIS — R32 Unspecified urinary incontinence: Secondary | ICD-10-CM | POA: Diagnosis not present

## 2023-11-26 DIAGNOSIS — Z9181 History of falling: Secondary | ICD-10-CM | POA: Diagnosis not present

## 2023-11-26 DIAGNOSIS — F0390 Unspecified dementia without behavioral disturbance: Secondary | ICD-10-CM | POA: Diagnosis not present

## 2023-11-26 DIAGNOSIS — R1310 Dysphagia, unspecified: Secondary | ICD-10-CM | POA: Diagnosis not present

## 2023-11-27 DIAGNOSIS — D631 Anemia in chronic kidney disease: Secondary | ICD-10-CM | POA: Diagnosis not present

## 2023-11-27 DIAGNOSIS — Z9181 History of falling: Secondary | ICD-10-CM | POA: Diagnosis not present

## 2023-11-27 DIAGNOSIS — R1310 Dysphagia, unspecified: Secondary | ICD-10-CM | POA: Diagnosis not present

## 2023-11-27 DIAGNOSIS — I700 Atherosclerosis of aorta: Secondary | ICD-10-CM | POA: Diagnosis not present

## 2023-11-27 DIAGNOSIS — F02A0 Dementia in other diseases classified elsewhere, mild, without behavioral disturbance, psychotic disturbance, mood disturbance, and anxiety: Secondary | ICD-10-CM | POA: Diagnosis not present

## 2023-11-27 DIAGNOSIS — Z7952 Long term (current) use of systemic steroids: Secondary | ICD-10-CM | POA: Diagnosis not present

## 2023-11-27 DIAGNOSIS — R32 Unspecified urinary incontinence: Secondary | ICD-10-CM | POA: Diagnosis not present

## 2023-11-27 DIAGNOSIS — E785 Hyperlipidemia, unspecified: Secondary | ICD-10-CM | POA: Diagnosis not present

## 2023-11-27 DIAGNOSIS — E039 Hypothyroidism, unspecified: Secondary | ICD-10-CM | POA: Diagnosis not present

## 2023-11-27 DIAGNOSIS — I739 Peripheral vascular disease, unspecified: Secondary | ICD-10-CM | POA: Diagnosis not present

## 2023-11-27 DIAGNOSIS — I129 Hypertensive chronic kidney disease with stage 1 through stage 4 chronic kidney disease, or unspecified chronic kidney disease: Secondary | ICD-10-CM | POA: Diagnosis not present

## 2023-11-27 DIAGNOSIS — S81812D Laceration without foreign body, left lower leg, subsequent encounter: Secondary | ICD-10-CM | POA: Diagnosis not present

## 2023-11-27 DIAGNOSIS — N1832 Chronic kidney disease, stage 3b: Secondary | ICD-10-CM | POA: Diagnosis not present

## 2023-11-27 DIAGNOSIS — S82002D Unspecified fracture of left patella, subsequent encounter for closed fracture with routine healing: Secondary | ICD-10-CM | POA: Diagnosis not present

## 2023-11-27 DIAGNOSIS — Z556 Problems related to health literacy: Secondary | ICD-10-CM | POA: Diagnosis not present

## 2023-11-27 DIAGNOSIS — I10 Essential (primary) hypertension: Secondary | ICD-10-CM | POA: Diagnosis not present

## 2023-11-27 DIAGNOSIS — F0390 Unspecified dementia without behavioral disturbance: Secondary | ICD-10-CM | POA: Diagnosis not present

## 2023-11-30 DIAGNOSIS — D631 Anemia in chronic kidney disease: Secondary | ICD-10-CM | POA: Diagnosis not present

## 2023-11-30 DIAGNOSIS — I739 Peripheral vascular disease, unspecified: Secondary | ICD-10-CM | POA: Diagnosis not present

## 2023-11-30 DIAGNOSIS — F02A0 Dementia in other diseases classified elsewhere, mild, without behavioral disturbance, psychotic disturbance, mood disturbance, and anxiety: Secondary | ICD-10-CM | POA: Diagnosis not present

## 2023-11-30 DIAGNOSIS — E039 Hypothyroidism, unspecified: Secondary | ICD-10-CM | POA: Diagnosis not present

## 2023-11-30 DIAGNOSIS — I700 Atherosclerosis of aorta: Secondary | ICD-10-CM | POA: Diagnosis not present

## 2023-11-30 DIAGNOSIS — I129 Hypertensive chronic kidney disease with stage 1 through stage 4 chronic kidney disease, or unspecified chronic kidney disease: Secondary | ICD-10-CM | POA: Diagnosis not present

## 2023-11-30 DIAGNOSIS — S81812D Laceration without foreign body, left lower leg, subsequent encounter: Secondary | ICD-10-CM | POA: Diagnosis not present

## 2023-11-30 DIAGNOSIS — N1832 Chronic kidney disease, stage 3b: Secondary | ICD-10-CM | POA: Diagnosis not present

## 2023-11-30 DIAGNOSIS — S82002D Unspecified fracture of left patella, subsequent encounter for closed fracture with routine healing: Secondary | ICD-10-CM | POA: Diagnosis not present

## 2023-12-02 DIAGNOSIS — E782 Mixed hyperlipidemia: Secondary | ICD-10-CM | POA: Diagnosis not present

## 2023-12-02 DIAGNOSIS — R600 Localized edema: Secondary | ICD-10-CM | POA: Diagnosis not present

## 2023-12-02 DIAGNOSIS — F02A0 Dementia in other diseases classified elsewhere, mild, without behavioral disturbance, psychotic disturbance, mood disturbance, and anxiety: Secondary | ICD-10-CM | POA: Diagnosis not present

## 2023-12-02 DIAGNOSIS — E039 Hypothyroidism, unspecified: Secondary | ICD-10-CM | POA: Diagnosis not present

## 2023-12-02 DIAGNOSIS — I129 Hypertensive chronic kidney disease with stage 1 through stage 4 chronic kidney disease, or unspecified chronic kidney disease: Secondary | ICD-10-CM | POA: Diagnosis not present

## 2023-12-02 DIAGNOSIS — D519 Vitamin B12 deficiency anemia, unspecified: Secondary | ICD-10-CM | POA: Diagnosis not present

## 2023-12-02 DIAGNOSIS — S81812D Laceration without foreign body, left lower leg, subsequent encounter: Secondary | ICD-10-CM | POA: Diagnosis not present

## 2023-12-02 DIAGNOSIS — I739 Peripheral vascular disease, unspecified: Secondary | ICD-10-CM | POA: Diagnosis not present

## 2023-12-02 DIAGNOSIS — E559 Vitamin D deficiency, unspecified: Secondary | ICD-10-CM | POA: Diagnosis not present

## 2023-12-02 DIAGNOSIS — N1832 Chronic kidney disease, stage 3b: Secondary | ICD-10-CM | POA: Diagnosis not present

## 2023-12-02 DIAGNOSIS — F0390 Unspecified dementia without behavioral disturbance: Secondary | ICD-10-CM | POA: Diagnosis not present

## 2023-12-02 DIAGNOSIS — D631 Anemia in chronic kidney disease: Secondary | ICD-10-CM | POA: Diagnosis not present

## 2023-12-02 DIAGNOSIS — I10 Essential (primary) hypertension: Secondary | ICD-10-CM | POA: Diagnosis not present

## 2023-12-02 DIAGNOSIS — D509 Iron deficiency anemia, unspecified: Secondary | ICD-10-CM | POA: Diagnosis not present

## 2023-12-02 DIAGNOSIS — I700 Atherosclerosis of aorta: Secondary | ICD-10-CM | POA: Diagnosis not present

## 2023-12-02 DIAGNOSIS — S82002D Unspecified fracture of left patella, subsequent encounter for closed fracture with routine healing: Secondary | ICD-10-CM | POA: Diagnosis not present

## 2023-12-02 DIAGNOSIS — F411 Generalized anxiety disorder: Secondary | ICD-10-CM | POA: Diagnosis not present

## 2023-12-04 DIAGNOSIS — I739 Peripheral vascular disease, unspecified: Secondary | ICD-10-CM | POA: Diagnosis not present

## 2023-12-04 DIAGNOSIS — E039 Hypothyroidism, unspecified: Secondary | ICD-10-CM | POA: Diagnosis not present

## 2023-12-04 DIAGNOSIS — S81812D Laceration without foreign body, left lower leg, subsequent encounter: Secondary | ICD-10-CM | POA: Diagnosis not present

## 2023-12-04 DIAGNOSIS — F02A0 Dementia in other diseases classified elsewhere, mild, without behavioral disturbance, psychotic disturbance, mood disturbance, and anxiety: Secondary | ICD-10-CM | POA: Diagnosis not present

## 2023-12-04 DIAGNOSIS — S82002D Unspecified fracture of left patella, subsequent encounter for closed fracture with routine healing: Secondary | ICD-10-CM | POA: Diagnosis not present

## 2023-12-04 DIAGNOSIS — I700 Atherosclerosis of aorta: Secondary | ICD-10-CM | POA: Diagnosis not present

## 2023-12-04 DIAGNOSIS — N1832 Chronic kidney disease, stage 3b: Secondary | ICD-10-CM | POA: Diagnosis not present

## 2023-12-04 DIAGNOSIS — I129 Hypertensive chronic kidney disease with stage 1 through stage 4 chronic kidney disease, or unspecified chronic kidney disease: Secondary | ICD-10-CM | POA: Diagnosis not present

## 2023-12-04 DIAGNOSIS — D631 Anemia in chronic kidney disease: Secondary | ICD-10-CM | POA: Diagnosis not present

## 2023-12-06 ENCOUNTER — Other Ambulatory Visit (HOSPITAL_COMMUNITY): Payer: Self-pay | Admitting: Nephrology

## 2023-12-06 DIAGNOSIS — I5030 Unspecified diastolic (congestive) heart failure: Secondary | ICD-10-CM | POA: Diagnosis not present

## 2023-12-06 DIAGNOSIS — D638 Anemia in other chronic diseases classified elsewhere: Secondary | ICD-10-CM | POA: Diagnosis not present

## 2023-12-06 DIAGNOSIS — I129 Hypertensive chronic kidney disease with stage 1 through stage 4 chronic kidney disease, or unspecified chronic kidney disease: Secondary | ICD-10-CM | POA: Diagnosis not present

## 2023-12-06 DIAGNOSIS — I739 Peripheral vascular disease, unspecified: Secondary | ICD-10-CM | POA: Diagnosis not present

## 2023-12-06 DIAGNOSIS — N184 Chronic kidney disease, stage 4 (severe): Secondary | ICD-10-CM | POA: Diagnosis not present

## 2023-12-06 DIAGNOSIS — E119 Type 2 diabetes mellitus without complications: Secondary | ICD-10-CM | POA: Diagnosis not present

## 2023-12-06 DIAGNOSIS — E039 Hypothyroidism, unspecified: Secondary | ICD-10-CM | POA: Diagnosis not present

## 2023-12-06 DIAGNOSIS — S82002D Unspecified fracture of left patella, subsequent encounter for closed fracture with routine healing: Secondary | ICD-10-CM | POA: Diagnosis not present

## 2023-12-06 DIAGNOSIS — I700 Atherosclerosis of aorta: Secondary | ICD-10-CM | POA: Diagnosis not present

## 2023-12-06 DIAGNOSIS — E875 Hyperkalemia: Secondary | ICD-10-CM | POA: Diagnosis not present

## 2023-12-06 DIAGNOSIS — N2581 Secondary hyperparathyroidism of renal origin: Secondary | ICD-10-CM | POA: Diagnosis not present

## 2023-12-06 DIAGNOSIS — N1832 Chronic kidney disease, stage 3b: Secondary | ICD-10-CM | POA: Diagnosis not present

## 2023-12-06 DIAGNOSIS — E559 Vitamin D deficiency, unspecified: Secondary | ICD-10-CM | POA: Diagnosis not present

## 2023-12-06 DIAGNOSIS — D519 Vitamin B12 deficiency anemia, unspecified: Secondary | ICD-10-CM | POA: Diagnosis not present

## 2023-12-06 DIAGNOSIS — E782 Mixed hyperlipidemia: Secondary | ICD-10-CM | POA: Diagnosis not present

## 2023-12-06 DIAGNOSIS — D631 Anemia in chronic kidney disease: Secondary | ICD-10-CM | POA: Diagnosis not present

## 2023-12-06 DIAGNOSIS — F02A0 Dementia in other diseases classified elsewhere, mild, without behavioral disturbance, psychotic disturbance, mood disturbance, and anxiety: Secondary | ICD-10-CM | POA: Diagnosis not present

## 2023-12-06 DIAGNOSIS — S81812D Laceration without foreign body, left lower leg, subsequent encounter: Secondary | ICD-10-CM | POA: Diagnosis not present

## 2023-12-06 DIAGNOSIS — I10 Essential (primary) hypertension: Secondary | ICD-10-CM | POA: Diagnosis not present

## 2023-12-06 DIAGNOSIS — I130 Hypertensive heart and chronic kidney disease with heart failure and stage 1 through stage 4 chronic kidney disease, or unspecified chronic kidney disease: Secondary | ICD-10-CM | POA: Diagnosis not present

## 2023-12-06 DIAGNOSIS — R600 Localized edema: Secondary | ICD-10-CM

## 2023-12-09 ENCOUNTER — Ambulatory Visit (HOSPITAL_COMMUNITY)
Admission: RE | Admit: 2023-12-09 | Discharge: 2023-12-09 | Disposition: A | Discharge status: Home / Self Care | Source: Ambulatory Visit | Attending: Nephrology | Admitting: Nephrology

## 2023-12-09 DIAGNOSIS — N1832 Chronic kidney disease, stage 3b: Secondary | ICD-10-CM | POA: Diagnosis not present

## 2023-12-09 DIAGNOSIS — F02A0 Dementia in other diseases classified elsewhere, mild, without behavioral disturbance, psychotic disturbance, mood disturbance, and anxiety: Secondary | ICD-10-CM | POA: Diagnosis not present

## 2023-12-09 DIAGNOSIS — S82002D Unspecified fracture of left patella, subsequent encounter for closed fracture with routine healing: Secondary | ICD-10-CM | POA: Diagnosis not present

## 2023-12-09 DIAGNOSIS — I739 Peripheral vascular disease, unspecified: Secondary | ICD-10-CM | POA: Diagnosis not present

## 2023-12-09 DIAGNOSIS — D631 Anemia in chronic kidney disease: Secondary | ICD-10-CM | POA: Diagnosis not present

## 2023-12-09 DIAGNOSIS — R600 Localized edema: Secondary | ICD-10-CM | POA: Diagnosis not present

## 2023-12-09 DIAGNOSIS — S81812D Laceration without foreign body, left lower leg, subsequent encounter: Secondary | ICD-10-CM | POA: Diagnosis not present

## 2023-12-09 DIAGNOSIS — M7989 Other specified soft tissue disorders: Secondary | ICD-10-CM | POA: Diagnosis not present

## 2023-12-09 DIAGNOSIS — I700 Atherosclerosis of aorta: Secondary | ICD-10-CM | POA: Diagnosis not present

## 2023-12-09 DIAGNOSIS — I129 Hypertensive chronic kidney disease with stage 1 through stage 4 chronic kidney disease, or unspecified chronic kidney disease: Secondary | ICD-10-CM | POA: Diagnosis not present

## 2023-12-09 DIAGNOSIS — E039 Hypothyroidism, unspecified: Secondary | ICD-10-CM | POA: Diagnosis not present

## 2023-12-10 DIAGNOSIS — F411 Generalized anxiety disorder: Secondary | ICD-10-CM | POA: Diagnosis not present

## 2023-12-11 DIAGNOSIS — S82002D Unspecified fracture of left patella, subsequent encounter for closed fracture with routine healing: Secondary | ICD-10-CM | POA: Diagnosis not present

## 2023-12-11 DIAGNOSIS — F02A0 Dementia in other diseases classified elsewhere, mild, without behavioral disturbance, psychotic disturbance, mood disturbance, and anxiety: Secondary | ICD-10-CM | POA: Diagnosis not present

## 2023-12-11 DIAGNOSIS — I129 Hypertensive chronic kidney disease with stage 1 through stage 4 chronic kidney disease, or unspecified chronic kidney disease: Secondary | ICD-10-CM | POA: Diagnosis not present

## 2023-12-11 DIAGNOSIS — I739 Peripheral vascular disease, unspecified: Secondary | ICD-10-CM | POA: Diagnosis not present

## 2023-12-11 DIAGNOSIS — S81812D Laceration without foreign body, left lower leg, subsequent encounter: Secondary | ICD-10-CM | POA: Diagnosis not present

## 2023-12-11 DIAGNOSIS — E039 Hypothyroidism, unspecified: Secondary | ICD-10-CM | POA: Diagnosis not present

## 2023-12-11 DIAGNOSIS — D631 Anemia in chronic kidney disease: Secondary | ICD-10-CM | POA: Diagnosis not present

## 2023-12-11 DIAGNOSIS — N1832 Chronic kidney disease, stage 3b: Secondary | ICD-10-CM | POA: Diagnosis not present

## 2023-12-11 DIAGNOSIS — I700 Atherosclerosis of aorta: Secondary | ICD-10-CM | POA: Diagnosis not present

## 2023-12-13 DIAGNOSIS — S81812D Laceration without foreign body, left lower leg, subsequent encounter: Secondary | ICD-10-CM | POA: Diagnosis not present

## 2023-12-13 DIAGNOSIS — S82002D Unspecified fracture of left patella, subsequent encounter for closed fracture with routine healing: Secondary | ICD-10-CM | POA: Diagnosis not present

## 2023-12-13 DIAGNOSIS — F02A0 Dementia in other diseases classified elsewhere, mild, without behavioral disturbance, psychotic disturbance, mood disturbance, and anxiety: Secondary | ICD-10-CM | POA: Diagnosis not present

## 2023-12-13 DIAGNOSIS — N1832 Chronic kidney disease, stage 3b: Secondary | ICD-10-CM | POA: Diagnosis not present

## 2023-12-13 DIAGNOSIS — D631 Anemia in chronic kidney disease: Secondary | ICD-10-CM | POA: Diagnosis not present

## 2023-12-13 DIAGNOSIS — I129 Hypertensive chronic kidney disease with stage 1 through stage 4 chronic kidney disease, or unspecified chronic kidney disease: Secondary | ICD-10-CM | POA: Diagnosis not present

## 2023-12-13 DIAGNOSIS — I700 Atherosclerosis of aorta: Secondary | ICD-10-CM | POA: Diagnosis not present

## 2023-12-13 DIAGNOSIS — E039 Hypothyroidism, unspecified: Secondary | ICD-10-CM | POA: Diagnosis not present

## 2023-12-13 DIAGNOSIS — I739 Peripheral vascular disease, unspecified: Secondary | ICD-10-CM | POA: Diagnosis not present

## 2023-12-14 DIAGNOSIS — M542 Cervicalgia: Secondary | ICD-10-CM | POA: Diagnosis not present

## 2023-12-16 DIAGNOSIS — E559 Vitamin D deficiency, unspecified: Secondary | ICD-10-CM | POA: Diagnosis not present

## 2023-12-16 DIAGNOSIS — E039 Hypothyroidism, unspecified: Secondary | ICD-10-CM | POA: Diagnosis not present

## 2023-12-16 DIAGNOSIS — R609 Edema, unspecified: Secondary | ICD-10-CM | POA: Diagnosis not present

## 2023-12-16 DIAGNOSIS — I129 Hypertensive chronic kidney disease with stage 1 through stage 4 chronic kidney disease, or unspecified chronic kidney disease: Secondary | ICD-10-CM | POA: Diagnosis not present

## 2023-12-16 DIAGNOSIS — E875 Hyperkalemia: Secondary | ICD-10-CM | POA: Diagnosis not present

## 2023-12-16 DIAGNOSIS — I739 Peripheral vascular disease, unspecified: Secondary | ICD-10-CM | POA: Diagnosis not present

## 2023-12-16 DIAGNOSIS — S81812D Laceration without foreign body, left lower leg, subsequent encounter: Secondary | ICD-10-CM | POA: Diagnosis not present

## 2023-12-16 DIAGNOSIS — I700 Atherosclerosis of aorta: Secondary | ICD-10-CM | POA: Diagnosis not present

## 2023-12-16 DIAGNOSIS — D631 Anemia in chronic kidney disease: Secondary | ICD-10-CM | POA: Diagnosis not present

## 2023-12-16 DIAGNOSIS — N1832 Chronic kidney disease, stage 3b: Secondary | ICD-10-CM | POA: Diagnosis not present

## 2023-12-16 DIAGNOSIS — F02A0 Dementia in other diseases classified elsewhere, mild, without behavioral disturbance, psychotic disturbance, mood disturbance, and anxiety: Secondary | ICD-10-CM | POA: Diagnosis not present

## 2023-12-16 DIAGNOSIS — S82002D Unspecified fracture of left patella, subsequent encounter for closed fracture with routine healing: Secondary | ICD-10-CM | POA: Diagnosis not present

## 2023-12-17 DIAGNOSIS — F5101 Primary insomnia: Secondary | ICD-10-CM | POA: Diagnosis not present

## 2023-12-17 DIAGNOSIS — F411 Generalized anxiety disorder: Secondary | ICD-10-CM | POA: Diagnosis not present

## 2023-12-17 DIAGNOSIS — F0390 Unspecified dementia without behavioral disturbance: Secondary | ICD-10-CM | POA: Diagnosis not present

## 2023-12-19 DIAGNOSIS — I739 Peripheral vascular disease, unspecified: Secondary | ICD-10-CM | POA: Diagnosis not present

## 2023-12-19 DIAGNOSIS — D631 Anemia in chronic kidney disease: Secondary | ICD-10-CM | POA: Diagnosis not present

## 2023-12-19 DIAGNOSIS — F02A0 Dementia in other diseases classified elsewhere, mild, without behavioral disturbance, psychotic disturbance, mood disturbance, and anxiety: Secondary | ICD-10-CM | POA: Diagnosis not present

## 2023-12-19 DIAGNOSIS — N1832 Chronic kidney disease, stage 3b: Secondary | ICD-10-CM | POA: Diagnosis not present

## 2023-12-19 DIAGNOSIS — I129 Hypertensive chronic kidney disease with stage 1 through stage 4 chronic kidney disease, or unspecified chronic kidney disease: Secondary | ICD-10-CM | POA: Diagnosis not present

## 2023-12-19 DIAGNOSIS — S82002D Unspecified fracture of left patella, subsequent encounter for closed fracture with routine healing: Secondary | ICD-10-CM | POA: Diagnosis not present

## 2023-12-19 DIAGNOSIS — I700 Atherosclerosis of aorta: Secondary | ICD-10-CM | POA: Diagnosis not present

## 2023-12-19 DIAGNOSIS — S81812D Laceration without foreign body, left lower leg, subsequent encounter: Secondary | ICD-10-CM | POA: Diagnosis not present

## 2023-12-19 DIAGNOSIS — E039 Hypothyroidism, unspecified: Secondary | ICD-10-CM | POA: Diagnosis not present

## 2023-12-20 DIAGNOSIS — E559 Vitamin D deficiency, unspecified: Secondary | ICD-10-CM | POA: Diagnosis not present

## 2023-12-20 DIAGNOSIS — E875 Hyperkalemia: Secondary | ICD-10-CM | POA: Diagnosis not present

## 2023-12-20 DIAGNOSIS — R609 Edema, unspecified: Secondary | ICD-10-CM | POA: Diagnosis not present

## 2023-12-23 DIAGNOSIS — M159 Polyosteoarthritis, unspecified: Secondary | ICD-10-CM | POA: Diagnosis not present

## 2023-12-23 DIAGNOSIS — I700 Atherosclerosis of aorta: Secondary | ICD-10-CM | POA: Diagnosis not present

## 2023-12-23 DIAGNOSIS — F02A0 Dementia in other diseases classified elsewhere, mild, without behavioral disturbance, psychotic disturbance, mood disturbance, and anxiety: Secondary | ICD-10-CM | POA: Diagnosis not present

## 2023-12-23 DIAGNOSIS — I129 Hypertensive chronic kidney disease with stage 1 through stage 4 chronic kidney disease, or unspecified chronic kidney disease: Secondary | ICD-10-CM | POA: Diagnosis not present

## 2023-12-23 DIAGNOSIS — S81812D Laceration without foreign body, left lower leg, subsequent encounter: Secondary | ICD-10-CM | POA: Diagnosis not present

## 2023-12-23 DIAGNOSIS — D509 Iron deficiency anemia, unspecified: Secondary | ICD-10-CM | POA: Diagnosis not present

## 2023-12-23 DIAGNOSIS — D631 Anemia in chronic kidney disease: Secondary | ICD-10-CM | POA: Diagnosis not present

## 2023-12-23 DIAGNOSIS — E039 Hypothyroidism, unspecified: Secondary | ICD-10-CM | POA: Diagnosis not present

## 2023-12-23 DIAGNOSIS — S82002D Unspecified fracture of left patella, subsequent encounter for closed fracture with routine healing: Secondary | ICD-10-CM | POA: Diagnosis not present

## 2023-12-23 DIAGNOSIS — N1832 Chronic kidney disease, stage 3b: Secondary | ICD-10-CM | POA: Diagnosis not present

## 2023-12-23 DIAGNOSIS — I739 Peripheral vascular disease, unspecified: Secondary | ICD-10-CM | POA: Diagnosis not present

## 2023-12-25 DIAGNOSIS — D631 Anemia in chronic kidney disease: Secondary | ICD-10-CM | POA: Diagnosis not present

## 2023-12-25 DIAGNOSIS — S81812D Laceration without foreign body, left lower leg, subsequent encounter: Secondary | ICD-10-CM | POA: Diagnosis not present

## 2023-12-25 DIAGNOSIS — I129 Hypertensive chronic kidney disease with stage 1 through stage 4 chronic kidney disease, or unspecified chronic kidney disease: Secondary | ICD-10-CM | POA: Diagnosis not present

## 2023-12-25 DIAGNOSIS — F02A0 Dementia in other diseases classified elsewhere, mild, without behavioral disturbance, psychotic disturbance, mood disturbance, and anxiety: Secondary | ICD-10-CM | POA: Diagnosis not present

## 2023-12-25 DIAGNOSIS — I739 Peripheral vascular disease, unspecified: Secondary | ICD-10-CM | POA: Diagnosis not present

## 2023-12-25 DIAGNOSIS — E039 Hypothyroidism, unspecified: Secondary | ICD-10-CM | POA: Diagnosis not present

## 2023-12-25 DIAGNOSIS — S82002D Unspecified fracture of left patella, subsequent encounter for closed fracture with routine healing: Secondary | ICD-10-CM | POA: Diagnosis not present

## 2023-12-25 DIAGNOSIS — I700 Atherosclerosis of aorta: Secondary | ICD-10-CM | POA: Diagnosis not present

## 2023-12-25 DIAGNOSIS — N1832 Chronic kidney disease, stage 3b: Secondary | ICD-10-CM | POA: Diagnosis not present

## 2023-12-27 DIAGNOSIS — E039 Hypothyroidism, unspecified: Secondary | ICD-10-CM | POA: Diagnosis not present

## 2023-12-27 DIAGNOSIS — S82002D Unspecified fracture of left patella, subsequent encounter for closed fracture with routine healing: Secondary | ICD-10-CM | POA: Diagnosis not present

## 2023-12-27 DIAGNOSIS — F02A0 Dementia in other diseases classified elsewhere, mild, without behavioral disturbance, psychotic disturbance, mood disturbance, and anxiety: Secondary | ICD-10-CM | POA: Diagnosis not present

## 2023-12-27 DIAGNOSIS — I129 Hypertensive chronic kidney disease with stage 1 through stage 4 chronic kidney disease, or unspecified chronic kidney disease: Secondary | ICD-10-CM | POA: Diagnosis not present

## 2023-12-27 DIAGNOSIS — S81812D Laceration without foreign body, left lower leg, subsequent encounter: Secondary | ICD-10-CM | POA: Diagnosis not present

## 2023-12-27 DIAGNOSIS — I700 Atherosclerosis of aorta: Secondary | ICD-10-CM | POA: Diagnosis not present

## 2023-12-27 DIAGNOSIS — N1832 Chronic kidney disease, stage 3b: Secondary | ICD-10-CM | POA: Diagnosis not present

## 2023-12-27 DIAGNOSIS — D631 Anemia in chronic kidney disease: Secondary | ICD-10-CM | POA: Diagnosis not present

## 2023-12-27 DIAGNOSIS — I739 Peripheral vascular disease, unspecified: Secondary | ICD-10-CM | POA: Diagnosis not present

## 2023-12-30 DIAGNOSIS — E039 Hypothyroidism, unspecified: Secondary | ICD-10-CM | POA: Diagnosis not present

## 2023-12-30 DIAGNOSIS — I700 Atherosclerosis of aorta: Secondary | ICD-10-CM | POA: Diagnosis not present

## 2023-12-30 DIAGNOSIS — S82002D Unspecified fracture of left patella, subsequent encounter for closed fracture with routine healing: Secondary | ICD-10-CM | POA: Diagnosis not present

## 2023-12-30 DIAGNOSIS — N1832 Chronic kidney disease, stage 3b: Secondary | ICD-10-CM | POA: Diagnosis not present

## 2023-12-30 DIAGNOSIS — D631 Anemia in chronic kidney disease: Secondary | ICD-10-CM | POA: Diagnosis not present

## 2023-12-30 DIAGNOSIS — I739 Peripheral vascular disease, unspecified: Secondary | ICD-10-CM | POA: Diagnosis not present

## 2023-12-30 DIAGNOSIS — I129 Hypertensive chronic kidney disease with stage 1 through stage 4 chronic kidney disease, or unspecified chronic kidney disease: Secondary | ICD-10-CM | POA: Diagnosis not present

## 2023-12-30 DIAGNOSIS — S81812D Laceration without foreign body, left lower leg, subsequent encounter: Secondary | ICD-10-CM | POA: Diagnosis not present

## 2023-12-30 DIAGNOSIS — F02A0 Dementia in other diseases classified elsewhere, mild, without behavioral disturbance, psychotic disturbance, mood disturbance, and anxiety: Secondary | ICD-10-CM | POA: Diagnosis not present

## 2023-12-31 DIAGNOSIS — D519 Vitamin B12 deficiency anemia, unspecified: Secondary | ICD-10-CM | POA: Diagnosis not present

## 2023-12-31 DIAGNOSIS — I10 Essential (primary) hypertension: Secondary | ICD-10-CM | POA: Diagnosis not present

## 2024-01-01 DIAGNOSIS — F02A0 Dementia in other diseases classified elsewhere, mild, without behavioral disturbance, psychotic disturbance, mood disturbance, and anxiety: Secondary | ICD-10-CM | POA: Diagnosis not present

## 2024-01-01 DIAGNOSIS — I739 Peripheral vascular disease, unspecified: Secondary | ICD-10-CM | POA: Diagnosis not present

## 2024-01-01 DIAGNOSIS — I700 Atherosclerosis of aorta: Secondary | ICD-10-CM | POA: Diagnosis not present

## 2024-01-01 DIAGNOSIS — D631 Anemia in chronic kidney disease: Secondary | ICD-10-CM | POA: Diagnosis not present

## 2024-01-01 DIAGNOSIS — I129 Hypertensive chronic kidney disease with stage 1 through stage 4 chronic kidney disease, or unspecified chronic kidney disease: Secondary | ICD-10-CM | POA: Diagnosis not present

## 2024-01-01 DIAGNOSIS — S82002D Unspecified fracture of left patella, subsequent encounter for closed fracture with routine healing: Secondary | ICD-10-CM | POA: Diagnosis not present

## 2024-01-01 DIAGNOSIS — S81812D Laceration without foreign body, left lower leg, subsequent encounter: Secondary | ICD-10-CM | POA: Diagnosis not present

## 2024-01-01 DIAGNOSIS — E039 Hypothyroidism, unspecified: Secondary | ICD-10-CM | POA: Diagnosis not present

## 2024-01-01 DIAGNOSIS — N1832 Chronic kidney disease, stage 3b: Secondary | ICD-10-CM | POA: Diagnosis not present

## 2024-01-03 DIAGNOSIS — S81812D Laceration without foreign body, left lower leg, subsequent encounter: Secondary | ICD-10-CM | POA: Diagnosis not present

## 2024-01-03 DIAGNOSIS — I129 Hypertensive chronic kidney disease with stage 1 through stage 4 chronic kidney disease, or unspecified chronic kidney disease: Secondary | ICD-10-CM | POA: Diagnosis not present

## 2024-01-03 DIAGNOSIS — I700 Atherosclerosis of aorta: Secondary | ICD-10-CM | POA: Diagnosis not present

## 2024-01-03 DIAGNOSIS — N1832 Chronic kidney disease, stage 3b: Secondary | ICD-10-CM | POA: Diagnosis not present

## 2024-01-03 DIAGNOSIS — E039 Hypothyroidism, unspecified: Secondary | ICD-10-CM | POA: Diagnosis not present

## 2024-01-03 DIAGNOSIS — S82002D Unspecified fracture of left patella, subsequent encounter for closed fracture with routine healing: Secondary | ICD-10-CM | POA: Diagnosis not present

## 2024-01-03 DIAGNOSIS — I739 Peripheral vascular disease, unspecified: Secondary | ICD-10-CM | POA: Diagnosis not present

## 2024-01-03 DIAGNOSIS — D631 Anemia in chronic kidney disease: Secondary | ICD-10-CM | POA: Diagnosis not present

## 2024-01-03 DIAGNOSIS — F02A0 Dementia in other diseases classified elsewhere, mild, without behavioral disturbance, psychotic disturbance, mood disturbance, and anxiety: Secondary | ICD-10-CM | POA: Diagnosis not present

## 2024-01-06 DIAGNOSIS — D631 Anemia in chronic kidney disease: Secondary | ICD-10-CM | POA: Diagnosis not present

## 2024-01-06 DIAGNOSIS — N1832 Chronic kidney disease, stage 3b: Secondary | ICD-10-CM | POA: Diagnosis not present

## 2024-01-06 DIAGNOSIS — E039 Hypothyroidism, unspecified: Secondary | ICD-10-CM | POA: Diagnosis not present

## 2024-01-06 DIAGNOSIS — I700 Atherosclerosis of aorta: Secondary | ICD-10-CM | POA: Diagnosis not present

## 2024-01-06 DIAGNOSIS — I739 Peripheral vascular disease, unspecified: Secondary | ICD-10-CM | POA: Diagnosis not present

## 2024-01-06 DIAGNOSIS — I129 Hypertensive chronic kidney disease with stage 1 through stage 4 chronic kidney disease, or unspecified chronic kidney disease: Secondary | ICD-10-CM | POA: Diagnosis not present

## 2024-01-06 DIAGNOSIS — S81812D Laceration without foreign body, left lower leg, subsequent encounter: Secondary | ICD-10-CM | POA: Diagnosis not present

## 2024-01-06 DIAGNOSIS — F02A0 Dementia in other diseases classified elsewhere, mild, without behavioral disturbance, psychotic disturbance, mood disturbance, and anxiety: Secondary | ICD-10-CM | POA: Diagnosis not present

## 2024-01-06 DIAGNOSIS — S82002D Unspecified fracture of left patella, subsequent encounter for closed fracture with routine healing: Secondary | ICD-10-CM | POA: Diagnosis not present

## 2024-01-08 DIAGNOSIS — S82002D Unspecified fracture of left patella, subsequent encounter for closed fracture with routine healing: Secondary | ICD-10-CM | POA: Diagnosis not present

## 2024-01-08 DIAGNOSIS — I129 Hypertensive chronic kidney disease with stage 1 through stage 4 chronic kidney disease, or unspecified chronic kidney disease: Secondary | ICD-10-CM | POA: Diagnosis not present

## 2024-01-08 DIAGNOSIS — I700 Atherosclerosis of aorta: Secondary | ICD-10-CM | POA: Diagnosis not present

## 2024-01-08 DIAGNOSIS — S81812D Laceration without foreign body, left lower leg, subsequent encounter: Secondary | ICD-10-CM | POA: Diagnosis not present

## 2024-01-08 DIAGNOSIS — E039 Hypothyroidism, unspecified: Secondary | ICD-10-CM | POA: Diagnosis not present

## 2024-01-08 DIAGNOSIS — F02A0 Dementia in other diseases classified elsewhere, mild, without behavioral disturbance, psychotic disturbance, mood disturbance, and anxiety: Secondary | ICD-10-CM | POA: Diagnosis not present

## 2024-01-08 DIAGNOSIS — D631 Anemia in chronic kidney disease: Secondary | ICD-10-CM | POA: Diagnosis not present

## 2024-01-08 DIAGNOSIS — N1832 Chronic kidney disease, stage 3b: Secondary | ICD-10-CM | POA: Diagnosis not present

## 2024-01-08 DIAGNOSIS — I739 Peripheral vascular disease, unspecified: Secondary | ICD-10-CM | POA: Diagnosis not present

## 2024-01-10 DIAGNOSIS — R4182 Altered mental status, unspecified: Secondary | ICD-10-CM | POA: Diagnosis not present

## 2024-01-13 DIAGNOSIS — F5101 Primary insomnia: Secondary | ICD-10-CM | POA: Diagnosis not present

## 2024-01-13 DIAGNOSIS — N1832 Chronic kidney disease, stage 3b: Secondary | ICD-10-CM | POA: Diagnosis not present

## 2024-01-13 DIAGNOSIS — F411 Generalized anxiety disorder: Secondary | ICD-10-CM | POA: Diagnosis not present

## 2024-01-13 DIAGNOSIS — I739 Peripheral vascular disease, unspecified: Secondary | ICD-10-CM | POA: Diagnosis not present

## 2024-01-13 DIAGNOSIS — S81812D Laceration without foreign body, left lower leg, subsequent encounter: Secondary | ICD-10-CM | POA: Diagnosis not present

## 2024-01-13 DIAGNOSIS — F02A0 Dementia in other diseases classified elsewhere, mild, without behavioral disturbance, psychotic disturbance, mood disturbance, and anxiety: Secondary | ICD-10-CM | POA: Diagnosis not present

## 2024-01-13 DIAGNOSIS — I129 Hypertensive chronic kidney disease with stage 1 through stage 4 chronic kidney disease, or unspecified chronic kidney disease: Secondary | ICD-10-CM | POA: Diagnosis not present

## 2024-01-13 DIAGNOSIS — S82002D Unspecified fracture of left patella, subsequent encounter for closed fracture with routine healing: Secondary | ICD-10-CM | POA: Diagnosis not present

## 2024-01-13 DIAGNOSIS — D631 Anemia in chronic kidney disease: Secondary | ICD-10-CM | POA: Diagnosis not present

## 2024-01-13 DIAGNOSIS — F0390 Unspecified dementia without behavioral disturbance: Secondary | ICD-10-CM | POA: Diagnosis not present

## 2024-01-13 DIAGNOSIS — I700 Atherosclerosis of aorta: Secondary | ICD-10-CM | POA: Diagnosis not present

## 2024-01-13 DIAGNOSIS — E039 Hypothyroidism, unspecified: Secondary | ICD-10-CM | POA: Diagnosis not present

## 2024-01-21 DIAGNOSIS — N390 Urinary tract infection, site not specified: Secondary | ICD-10-CM | POA: Diagnosis not present

## 2024-01-23 DIAGNOSIS — D519 Vitamin B12 deficiency anemia, unspecified: Secondary | ICD-10-CM | POA: Diagnosis not present

## 2024-01-23 DIAGNOSIS — I10 Essential (primary) hypertension: Secondary | ICD-10-CM | POA: Diagnosis not present

## 2024-01-27 DIAGNOSIS — R600 Localized edema: Secondary | ICD-10-CM | POA: Diagnosis not present

## 2024-01-27 DIAGNOSIS — E782 Mixed hyperlipidemia: Secondary | ICD-10-CM | POA: Diagnosis not present

## 2024-01-27 DIAGNOSIS — D509 Iron deficiency anemia, unspecified: Secondary | ICD-10-CM | POA: Diagnosis not present

## 2024-02-10 DIAGNOSIS — R600 Localized edema: Secondary | ICD-10-CM | POA: Diagnosis not present

## 2024-02-10 DIAGNOSIS — R451 Restlessness and agitation: Secondary | ICD-10-CM | POA: Diagnosis not present

## 2024-02-12 DIAGNOSIS — N390 Urinary tract infection, site not specified: Secondary | ICD-10-CM | POA: Diagnosis not present

## 2024-02-13 DIAGNOSIS — F0390 Unspecified dementia without behavioral disturbance: Secondary | ICD-10-CM | POA: Diagnosis not present

## 2024-02-13 DIAGNOSIS — F5101 Primary insomnia: Secondary | ICD-10-CM | POA: Diagnosis not present

## 2024-02-13 DIAGNOSIS — F411 Generalized anxiety disorder: Secondary | ICD-10-CM | POA: Diagnosis not present

## 2024-02-17 DIAGNOSIS — R600 Localized edema: Secondary | ICD-10-CM | POA: Diagnosis not present

## 2024-02-24 DIAGNOSIS — S51812D Laceration without foreign body of left forearm, subsequent encounter: Secondary | ICD-10-CM | POA: Diagnosis not present

## 2024-02-24 DIAGNOSIS — I10 Essential (primary) hypertension: Secondary | ICD-10-CM | POA: Diagnosis not present

## 2024-02-24 DIAGNOSIS — E039 Hypothyroidism, unspecified: Secondary | ICD-10-CM | POA: Diagnosis not present

## 2024-03-02 DIAGNOSIS — F411 Generalized anxiety disorder: Secondary | ICD-10-CM | POA: Diagnosis not present

## 2024-03-02 DIAGNOSIS — I10 Essential (primary) hypertension: Secondary | ICD-10-CM | POA: Diagnosis not present

## 2024-03-10 DIAGNOSIS — F411 Generalized anxiety disorder: Secondary | ICD-10-CM | POA: Diagnosis not present

## 2024-03-10 DIAGNOSIS — F5101 Primary insomnia: Secondary | ICD-10-CM | POA: Diagnosis not present

## 2024-03-10 DIAGNOSIS — F0390 Unspecified dementia without behavioral disturbance: Secondary | ICD-10-CM | POA: Diagnosis not present

## 2024-03-10 IMAGING — MR MR ABDOMEN W/O CM
5 series · 48 of 48 positions shown · non-contrast
Comparison: Renal ultrasound 08/23/2021

CLINICAL DATA: Complex renal cyst.  Chronic kidney disease.

EXAM:
MRI ABDOMEN WITHOUT CONTRAST
TECHNIQUE: Multiplanar multisequence MR imaging was performed without the
administration of intravenous contrast.

[Series 5: cor haste · coronal · B · 6.0mm · 1.25mm/px · 8 of 26 slices shown]
[im 1/26]
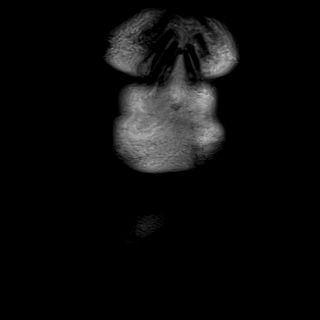
[im 4/26]
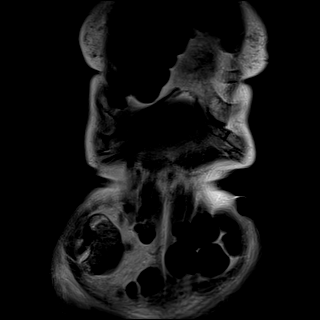
[im 8/26]
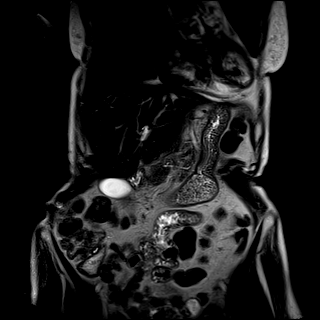
[im 11/26]
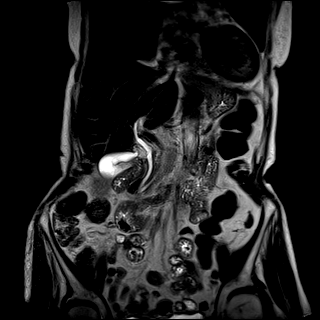
[im 15/26]
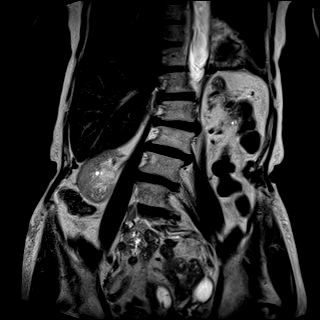
[im 18/26]
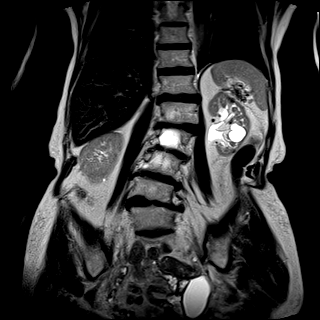
[im 22/26]
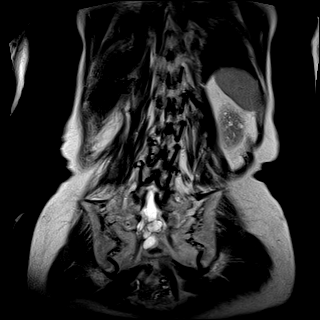
[im 26/26]
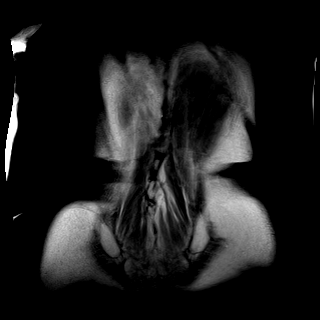

[Series 8: DWI · axial · B · 6.0mm · 1.42mm/px · z∈[-117,+120]mm · 10 of 34 slices shown (1 of 4)]
[im 1/34]
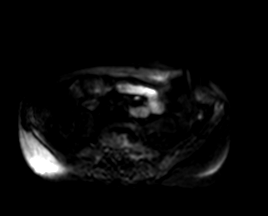
[im 4/34]
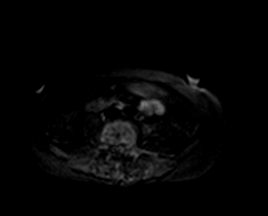
[im 8/34]
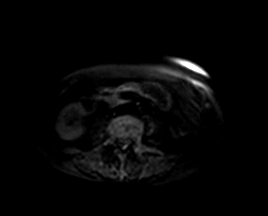
[im 12/34]
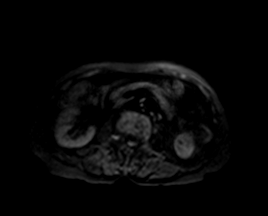
[im 15/34]
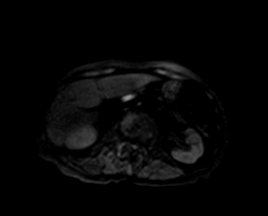
[im 19/34]
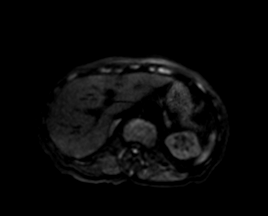
[im 23/34]
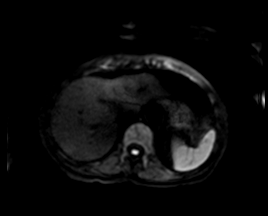
[im 26/34]
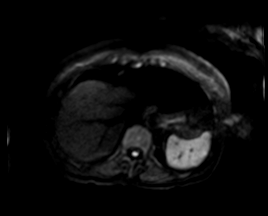
[im 30/34]
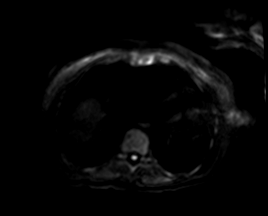
[im 34/34]
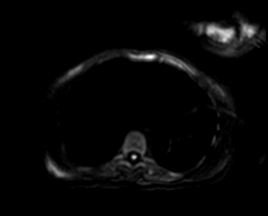

[Series 8: DWI · axial · B · 6.0mm · 1.42mm/px · z∈[-117,+120]mm · 10 of 34 slices shown (2 of 4)]
[im 1/34]
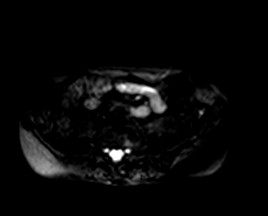
[im 4/34]
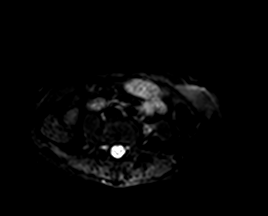
[im 8/34]
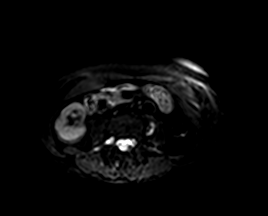
[im 12/34]
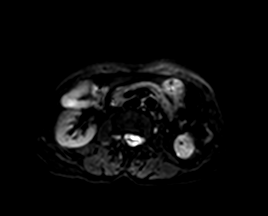
[im 15/34]
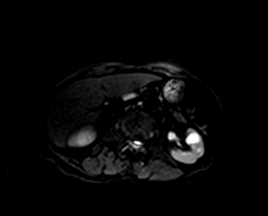
[im 19/34]
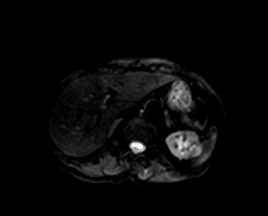
[im 23/34]
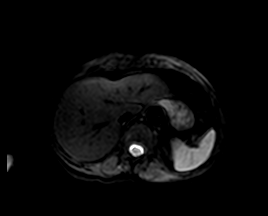
[im 26/34]
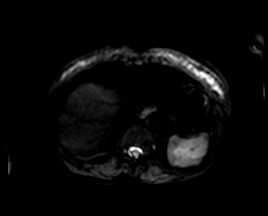
[im 30/34]
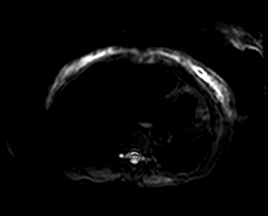
[im 34/34]
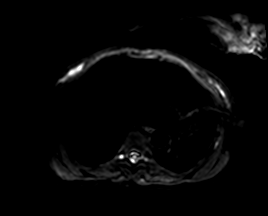

[Series 8: DWI · axial · B · 6.0mm · 1.42mm/px · z∈[-117,+120]mm · 10 of 34 slices shown (3 of 4)]
[im 1/34]
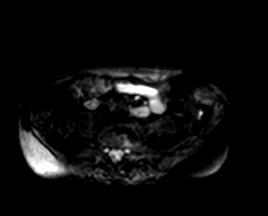
[im 4/34]
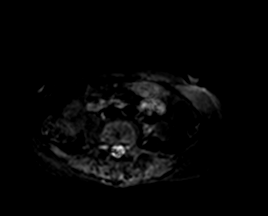
[im 8/34]
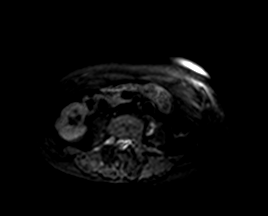
[im 12/34]
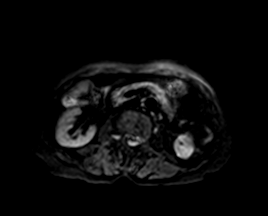
[im 15/34]
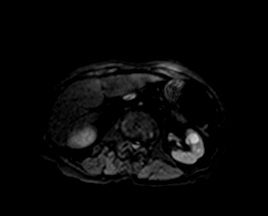
[im 19/34]
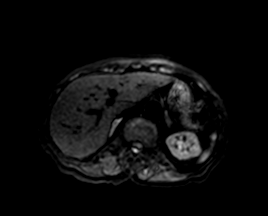
[im 23/34]
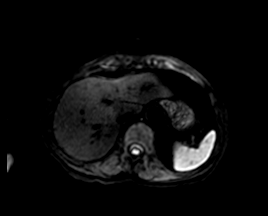
[im 26/34]
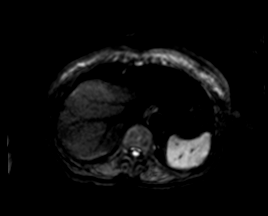
[im 30/34]
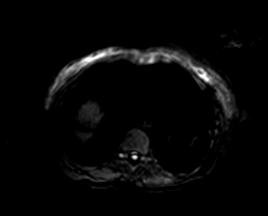
[im 34/34]
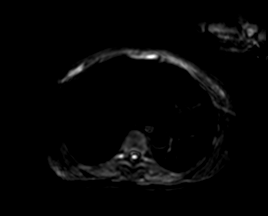

[Series 9: DWI · axial · B · 6.0mm · 1.42mm/px · z∈[-117,+120]mm · 10 of 34 slices shown (4 of 4)]
[im 1/34]
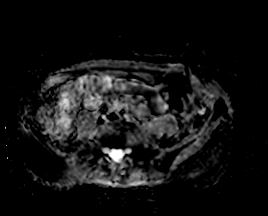
[im 4/34]
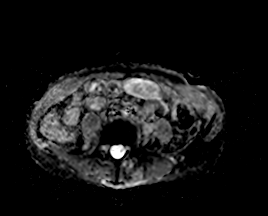
[im 8/34]
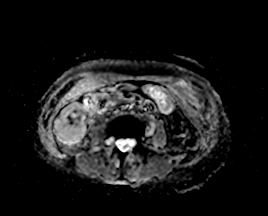
[im 12/34]
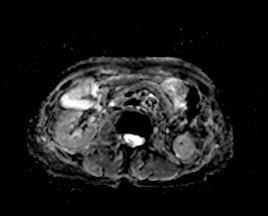
[im 15/34]
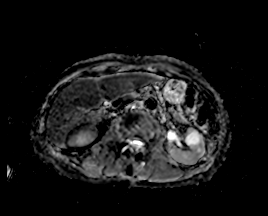
[im 19/34]
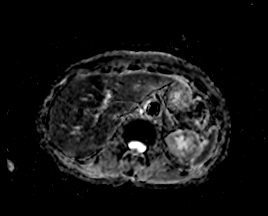
[im 23/34]
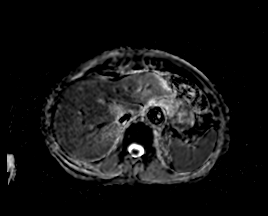
[im 26/34]
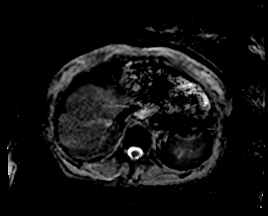
[im 30/34]
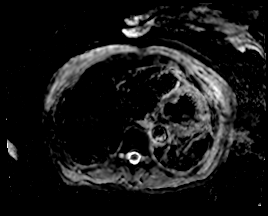
[im 34/34]
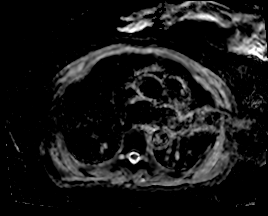

[48 of 48 positions shown; findings below may reference images not displayed]

The patient terminated the exam after acquisition of the coronal T2
series and the diffusion-weighted images. The stated reason for
terminating the exam was the noise made by the MRI machine. Please
note that this represents only a small subset of normal exam, and
substantially reduces diagnostic sensitivity and specificity.

Despite efforts by the technologist and patient, motion artifact is
present on today's exam and could not be eliminated. This reduces
exam sensitivity and specificity.
FINDINGS: Lower chest: Mild cardiomegaly.

Hepatobiliary: Unremarkable

Pancreas:  Unremarkable

Spleen:  Unremarkable

Adrenals/Urinary Tract:  The adrenal glands appear normal.

In the left mid to lower kidney, a 2.1 by 2.1 by 1.5 cm lesion with
homogeneous high (fluid signal) T2 signal characteristics is present
no obvious internal nodularity; questionable thin posterior
septation on image 19 series 5. This lesion demonstrates signal
dropout on high B value images favoring benign fluid signal
intensity.

Additional small left peripelvic cysts.

Stomach/Bowel: Unremarkable

Vascular/Lymphatic:  No abdominal aortic aneurysm identified.

Other: In the left hemipelvis, a fluid signal intensity structure
along the left adnexa measures at least 5 cm in length. A similar
lesion was shown on the prior MRI from 07/12/2021. This lesion is
only partially included on today's exam.

Musculoskeletal: Dextroconvex lumbar scoliosis with substantial
lumbar spondylosis and degenerative disc disease. Upper sacral
Tarlov cysts.
IMPRESSION: 1. The patient terminated the exam after acquisition of only the
diffusion-weighted series and the T2 coronal images. Accordingly we
are left with only a small subset of the normal exam. In conjunction
with motion artifact this substantially reduces diagnostic
sensitivity and specificity.
2. The 2.1 cm left renal cystic lesion has homogeneous fluid signal
intensity characteristics on T2 weighted imaging, and potentially a
thin posterior septation. This is highly likely to represent a
Bosniak category 2 cyst. The lesion has signal dropout on high B
value diffusion-weighted sequences further reinforcing the likely
benign nature of this cyst.
3. At least 5 cm fluid signal intensity structure along the left
adnexa. This is only partially characterized on today's exam but was
also seen on a prior pelvic MRI from 07/12/2021. Recommend follow-up
US in 6-12 months. Note: This recommendation does not apply to
premenarchal patients and to those with increased risk (genetic,
family history, elevated tumor markers or other high-risk factors)
of ovarian cancer. Reference: JACR [DATE]):[id number]
4. Mild cardiomegaly.
5. Dextroconvex lumbar scoliosis with spondylosis and degenerative
disc disease.

## 2024-03-13 DIAGNOSIS — E039 Hypothyroidism, unspecified: Secondary | ICD-10-CM | POA: Diagnosis not present

## 2024-03-13 DIAGNOSIS — I10 Essential (primary) hypertension: Secondary | ICD-10-CM | POA: Diagnosis not present

## 2024-03-13 DIAGNOSIS — E785 Hyperlipidemia, unspecified: Secondary | ICD-10-CM | POA: Diagnosis not present

## 2024-03-16 DIAGNOSIS — R4182 Altered mental status, unspecified: Secondary | ICD-10-CM | POA: Diagnosis not present

## 2024-03-18 DIAGNOSIS — N390 Urinary tract infection, site not specified: Secondary | ICD-10-CM | POA: Diagnosis not present

## 2024-03-18 DIAGNOSIS — D631 Anemia in chronic kidney disease: Secondary | ICD-10-CM | POA: Diagnosis not present

## 2024-03-18 DIAGNOSIS — E211 Secondary hyperparathyroidism, not elsewhere classified: Secondary | ICD-10-CM | POA: Diagnosis not present

## 2024-03-18 DIAGNOSIS — R809 Proteinuria, unspecified: Secondary | ICD-10-CM | POA: Diagnosis not present

## 2024-03-18 DIAGNOSIS — N189 Chronic kidney disease, unspecified: Secondary | ICD-10-CM | POA: Diagnosis not present

## 2024-03-23 DIAGNOSIS — E785 Hyperlipidemia, unspecified: Secondary | ICD-10-CM | POA: Diagnosis not present

## 2024-03-23 DIAGNOSIS — N390 Urinary tract infection, site not specified: Secondary | ICD-10-CM | POA: Diagnosis not present

## 2024-03-23 DIAGNOSIS — D509 Iron deficiency anemia, unspecified: Secondary | ICD-10-CM | POA: Diagnosis not present

## 2024-03-24 DIAGNOSIS — F0390 Unspecified dementia without behavioral disturbance: Secondary | ICD-10-CM | POA: Diagnosis not present

## 2024-03-24 DIAGNOSIS — F411 Generalized anxiety disorder: Secondary | ICD-10-CM | POA: Diagnosis not present

## 2024-03-24 DIAGNOSIS — E039 Hypothyroidism, unspecified: Secondary | ICD-10-CM | POA: Diagnosis not present

## 2024-03-24 DIAGNOSIS — I10 Essential (primary) hypertension: Secondary | ICD-10-CM | POA: Diagnosis not present

## 2024-03-26 DIAGNOSIS — D638 Anemia in other chronic diseases classified elsewhere: Secondary | ICD-10-CM | POA: Diagnosis not present

## 2024-03-26 DIAGNOSIS — N184 Chronic kidney disease, stage 4 (severe): Secondary | ICD-10-CM | POA: Diagnosis not present

## 2024-03-26 DIAGNOSIS — I5032 Chronic diastolic (congestive) heart failure: Secondary | ICD-10-CM | POA: Diagnosis not present

## 2024-03-26 DIAGNOSIS — N2581 Secondary hyperparathyroidism of renal origin: Secondary | ICD-10-CM | POA: Diagnosis not present

## 2024-04-05 DIAGNOSIS — F411 Generalized anxiety disorder: Secondary | ICD-10-CM | POA: Diagnosis not present

## 2024-04-05 DIAGNOSIS — F0393 Unspecified dementia, unspecified severity, with mood disturbance: Secondary | ICD-10-CM | POA: Diagnosis not present

## 2024-04-05 DIAGNOSIS — F5101 Primary insomnia: Secondary | ICD-10-CM | POA: Diagnosis not present

## 2024-04-20 DIAGNOSIS — M6281 Muscle weakness (generalized): Secondary | ICD-10-CM | POA: Diagnosis not present

## 2024-04-20 DIAGNOSIS — I10 Essential (primary) hypertension: Secondary | ICD-10-CM | POA: Diagnosis not present

## 2024-04-20 DIAGNOSIS — E039 Hypothyroidism, unspecified: Secondary | ICD-10-CM | POA: Diagnosis not present

## 2024-04-21 DIAGNOSIS — F0390 Unspecified dementia without behavioral disturbance: Secondary | ICD-10-CM | POA: Diagnosis not present

## 2024-04-21 DIAGNOSIS — F411 Generalized anxiety disorder: Secondary | ICD-10-CM | POA: Diagnosis not present

## 2024-04-22 ENCOUNTER — Encounter (HOSPITAL_COMMUNITY): Payer: Self-pay

## 2024-04-22 ENCOUNTER — Emergency Department (HOSPITAL_COMMUNITY)

## 2024-04-22 ENCOUNTER — Other Ambulatory Visit: Payer: Self-pay

## 2024-04-22 ENCOUNTER — Emergency Department (HOSPITAL_COMMUNITY)
Admission: EM | Admit: 2024-04-22 | Discharge: 2024-04-22 | Disposition: A | Discharge status: Skilled Nursing Facility | Attending: Emergency Medicine | Admitting: Emergency Medicine

## 2024-04-22 DIAGNOSIS — I129 Hypertensive chronic kidney disease with stage 1 through stage 4 chronic kidney disease, or unspecified chronic kidney disease: Secondary | ICD-10-CM | POA: Diagnosis not present

## 2024-04-22 DIAGNOSIS — S199XXA Unspecified injury of neck, initial encounter: Secondary | ICD-10-CM | POA: Diagnosis not present

## 2024-04-22 DIAGNOSIS — S0011XA Contusion of right eyelid and periocular area, initial encounter: Secondary | ICD-10-CM | POA: Insufficient documentation

## 2024-04-22 DIAGNOSIS — F0390 Unspecified dementia without behavioral disturbance: Secondary | ICD-10-CM | POA: Diagnosis not present

## 2024-04-22 DIAGNOSIS — I10 Essential (primary) hypertension: Secondary | ICD-10-CM | POA: Diagnosis not present

## 2024-04-22 DIAGNOSIS — N189 Chronic kidney disease, unspecified: Secondary | ICD-10-CM | POA: Insufficient documentation

## 2024-04-22 DIAGNOSIS — W06XXXA Fall from bed, initial encounter: Secondary | ICD-10-CM | POA: Diagnosis not present

## 2024-04-22 DIAGNOSIS — Z79899 Other long term (current) drug therapy: Secondary | ICD-10-CM | POA: Insufficient documentation

## 2024-04-22 DIAGNOSIS — W19XXXA Unspecified fall, initial encounter: Secondary | ICD-10-CM

## 2024-04-22 DIAGNOSIS — Y9301 Activity, walking, marching and hiking: Secondary | ICD-10-CM | POA: Diagnosis not present

## 2024-04-22 DIAGNOSIS — R519 Headache, unspecified: Secondary | ICD-10-CM | POA: Diagnosis not present

## 2024-04-22 DIAGNOSIS — J9811 Atelectasis: Secondary | ICD-10-CM | POA: Diagnosis not present

## 2024-04-22 DIAGNOSIS — R609 Edema, unspecified: Secondary | ICD-10-CM | POA: Diagnosis not present

## 2024-04-22 DIAGNOSIS — S3993XA Unspecified injury of pelvis, initial encounter: Secondary | ICD-10-CM | POA: Diagnosis not present

## 2024-04-22 DIAGNOSIS — S0990XA Unspecified injury of head, initial encounter: Secondary | ICD-10-CM | POA: Diagnosis not present

## 2024-04-22 DIAGNOSIS — S299XXA Unspecified injury of thorax, initial encounter: Secondary | ICD-10-CM | POA: Diagnosis not present

## 2024-04-22 DIAGNOSIS — J42 Unspecified chronic bronchitis: Secondary | ICD-10-CM | POA: Diagnosis not present

## 2024-04-22 DIAGNOSIS — I700 Atherosclerosis of aorta: Secondary | ICD-10-CM | POA: Diagnosis not present

## 2024-04-22 MED ORDER — ACETAMINOPHEN 325 MG PO TABS
650.0000 mg | ORAL_TABLET | Freq: Once | ORAL | Status: AC
  Administered 2024-04-22: 650 mg via ORAL
  Filled 2024-04-22: qty 2

## 2024-04-22 NOTE — ED Notes (Signed)
 Portable x-ray at bedside

## 2024-04-22 NOTE — ED Triage Notes (Signed)
 Pt from [place] c/o fall out of bed. NO blood thinners, no LOC. Hematoma to right eye with pain.

## 2024-04-22 NOTE — ED Provider Notes (Signed)
 [facility] AT [facility] Provider Note   CSN: [id number] Arrival date & time: 04/22/24  0124     Patient presents with: Brittany Holden is a 89 y.o. female.   HPI Patient presents after a fall.  Medical history includes HTN, HLD, CKD, IBS, dementia.  She is not on a blood thinner.  She describes a mechanical fall that occurred while she was walking to the bathroom.  This was due to loss of balance.  She did strike her head.  She has some mild pain in area of right eyebrow.  She denies any other areas of discomfort.    Prior to Admission medications   Medication Sig Start Date End Date Taking? Authorizing Provider  amLODipine (NORVASC) 5 MG tablet Take 5 mg by mouth daily.    [provider]  bacitracin  ointment Apply 1 Application topically 2 (two) times daily. Patient not taking: Reported on 10/03/2023 12/07/22   Kommor, Lum, MD  calcitRIOL (ROCALTROL) 0.25 MCG capsule Take 0.25 mcg by mouth 2 (two) times a week. 04/08/23   [provider]  chlorthalidone (HYGROTON) 25 MG tablet Take 25 mg by mouth every other day. 11/02/20 05/05/23  [provider]  Cholecalciferol 25 MCG (1000 UT) tablet Take 400 Units by mouth daily. Patient not taking: Reported on 10/03/2023    [provider]  Cyanocobalamin  (VITAMIN B-12 CR PO) Take by mouth. Patient not taking: Reported on 05/04/2022    [provider]  donepezil (ARICEPT) 10 MG tablet Take 10 mg by mouth daily. 08/30/23   [provider]  doxycycline  (VIBRAMYCIN ) 100 MG capsule Take 1 capsule (100 mg total) by mouth 2 (two) times daily. 11/17/23   Haze Lonni PARAS, MD  ferrous sulfate 325 (65 FE) MG tablet Take 325 mg by mouth daily with breakfast.    [provider]  hydrochlorothiazide  (MICROZIDE ) 12.5 MG capsule Take 12.5 mg by mouth every morning. Patient not taking: Reported on 10/03/2023 04/08/23   [provider]  levothyroxine   (SYNTHROID , LEVOTHROID) 75 MCG tablet Take 75 mcg by mouth daily.      [provider]  lidocaine  (LIDODERM ) 5 % Place 1 patch onto the skin daily. Remove & Discard patch within 12 hours or as directed by MD Patient not taking: Reported on 10/03/2023 08/20/22   Silver [place]onda LABOR, PA  metoprolol  succinate (TOPROL  XL) 25 MG 24 hr tablet Take 1 tablet (25 mg total) by mouth daily. 04/20/20   Okey Vina GAILS, MD  Multiple Minerals-Vitamins (CALCIUM & VIT D3 BONE HEALTH PO) Take 1 tablet by mouth daily. Patient not taking: Reported on 05/04/2022    [provider]  mupirocin  ointment (BACTROBAN ) 2 % Apply 1 Application topically daily. Patient not taking: Reported on 10/03/2023 04/12/23   Stuart Vernell Norris, PA-C  predniSONE  (DELTASONE ) 10 MG tablet Take 4 tablets (40 mg total) by mouth daily. Patient not taking: Reported on 10/03/2023 08/20/22   Silver Wonda A, PA  simvastatin  (ZOCOR ) 20 MG tablet Take 20 mg by mouth every morning.     [provider]  sodium zirconium cyclosilicate (LOKELMA) 5 g packet Take 10 g by mouth daily.    [provider]    Allergies: Patient has no known allergies.    Review of Systems  Neurological:  Positive for headaches.  All other systems reviewed and are negative.   Updated Vital Signs BP (!) 149/69   Pulse 67   Resp 15  SpO2 95%   Physical Exam Vitals and nursing note reviewed.  Constitutional:      General: She is not in acute distress.    Appearance: Normal appearance. She is well-developed. She is not ill-appearing, toxic-appearing or diaphoretic.  HENT:     Head: Normocephalic.     Comments: Bruising and swelling to right periorbital area.    Right Ear: External ear normal.     Left Ear: External ear normal.     Nose: Nose normal.     Mouth/Throat:     Mouth: Mucous membranes are moist.  Eyes:     Extraocular Movements: Extraocular movements intact.     Conjunctiva/sclera: Conjunctivae normal.   Cardiovascular:     Rate and Rhythm: Normal rate and regular rhythm.  Pulmonary:     Effort: Pulmonary effort is normal. No respiratory distress.  Chest:     Chest wall: No tenderness.  Abdominal:     General: There is no distension.     Palpations: Abdomen is soft.     Tenderness: There is no abdominal tenderness.  Musculoskeletal:        General: No swelling or deformity. Normal range of motion.     Cervical back: Normal range of motion and neck supple.  Skin:    General: Skin is warm and dry.     Capillary Refill: Capillary refill takes less than 2 seconds.     Coloration: Skin is not jaundiced or pale.  Neurological:     General: No focal deficit present.     Mental Status: She is alert and oriented to person, place, and time.     Cranial Nerves: No cranial nerve deficit.     Sensory: No sensory deficit.     Motor: No weakness.     Coordination: Coordination normal.  Psychiatric:        Mood and Affect: Mood normal.     (all labs ordered are listed, but only abnormal results are displayed) Labs Reviewed - No data to display  EKG: EKG Interpretation Date/Time:  Wednesday April 22 2024 02:00:39 EDT Ventricular Rate:  72 PR Interval:  179 QRS Duration:  102 QT Interval:  446 QTC Calculation: 489 R Axis:   35  Text Interpretation: Sinus arrhythmia Borderline prolonged QT interval Confirmed by Melvenia Motto ([id number]) on 04/22/2024 3:22:04 AM  Radiology: CT HEAD WO CONTRAST Result Date: 04/22/2024 CLINICAL DATA:  Head trauma, moderate-severe; Polytrauma, blunt EXAM: CT HEAD WITHOUT CONTRAST CT CERVICAL SPINE WITHOUT CONTRAST TECHNIQUE: Multidetector CT imaging of the head and cervical spine was performed following the standard protocol without intravenous contrast. Multiplanar CT image reconstructions of the cervical spine were also generated. RADIATION DOSE REDUCTION: This exam was performed according to the departmental dose-optimization program which includes automated  exposure control, adjustment of the mA and/or kV according to patient size and/or use of iterative reconstruction technique. COMPARISON:  None Available. FINDINGS: CT HEAD FINDINGS Brain: No evidence of acute infarction, hemorrhage, hydrocephalus, extra-axial collection or mass lesion/mass effect. Patchy white matter hypodensities, compatible with chronic microvascular ischemic change. Vascular: Calcific atherosclerosis.  No hyperdense vessel. Skull: No acute fracture. Sinuses/Orbits: Chronically opacified left maxillary sinus and suspected polypoid mass in left nasal cavity CT CERVICAL SPINE FINDINGS Alignment: Normal. Skull base and vertebrae: No evidence of acute fracture. Osteopenia. Soft tissues and spinal canal: No prevertebral fluid or swelling. No visible canal hematoma. Disc levels: Mild-to-moderate multilevel bony degenerative change. Upper chest: Clear lung apices. IMPRESSION: 1. No evidence of acute intracranial abnormality. 2.  No evidence of acute fracture or traumatic malalignment in the cervical spine. 3. Chronically opacified left maxillary sinus and partially imaged suspected mass in the left nasal cavity (nonspecific but most commonly a polyp). Recommend consider correlation with direct inspection. Electronically Signed   By: Gilmore GORMAN Molt M.D.   On: 04/22/2024 03:17   CT CERVICAL SPINE WO CONTRAST Result Date: 04/22/2024 CLINICAL DATA:  Head trauma, moderate-severe; Polytrauma, blunt EXAM: CT HEAD WITHOUT CONTRAST CT CERVICAL SPINE WITHOUT CONTRAST TECHNIQUE: Multidetector CT imaging of the head and cervical spine was performed following the standard protocol without intravenous contrast. Multiplanar CT image reconstructions of the cervical spine were also generated. RADIATION DOSE REDUCTION: This exam was performed according to the departmental dose-optimization program which includes automated exposure control, adjustment of the mA and/or kV according to patient size and/or use of  iterative reconstruction technique. COMPARISON:  None Available. FINDINGS: CT HEAD FINDINGS Brain: No evidence of acute infarction, hemorrhage, hydrocephalus, extra-axial collection or mass lesion/mass effect. Patchy white matter hypodensities, compatible with chronic microvascular ischemic change. Vascular: Calcific atherosclerosis.  No hyperdense vessel. Skull: No acute fracture. Sinuses/Orbits: Chronically opacified left maxillary sinus and suspected polypoid mass in left nasal cavity CT CERVICAL SPINE FINDINGS Alignment: Normal. Skull base and vertebrae: No evidence of acute fracture. Osteopenia. Soft tissues and spinal canal: No prevertebral fluid or swelling. No visible canal hematoma. Disc levels: Mild-to-moderate multilevel bony degenerative change. Upper chest: Clear lung apices. IMPRESSION: 1. No evidence of acute intracranial abnormality. 2. No evidence of acute fracture or traumatic malalignment in the cervical spine. 3. Chronically opacified left maxillary sinus and partially imaged suspected mass in the left nasal cavity (nonspecific but most commonly a polyp). Recommend consider correlation with direct inspection. Electronically Signed   By: Gilmore GORMAN Molt M.D.   On: 04/22/2024 03:17   DG Pelvis Portable Result Date: 04/22/2024 CLINICAL DATA:  Trauma, fell earlier this morning EXAM: PORTABLE PELVIS 1-2 VIEWS COMPARISON:  11/16/2023 FINDINGS: There is no evidence of pelvic fracture or diastasis. No pelvic bone lesions are seen. IMPRESSION: Negative. Electronically Signed   By: Norman Gatlin M.D.   On: 04/22/2024 01:48   DG Chest Port 1 View Result Date: 04/22/2024 CLINICAL DATA:  Trauma, fell earlier this morning EXAM: PORTABLE CHEST 1 VIEW COMPARISON:  11/16/2023 FINDINGS: Stable cardiomediastinal silhouette. Aortic atherosclerotic calcification. Hyperinflation and chronic bronchitic change. Left basilar atelectasis. Otherwise no focal consolidation, pleural effusion, or pneumothorax. No  displaced rib fractures. IMPRESSION: No active disease. Electronically Signed   By: Norman Gatlin M.D.   On: 04/22/2024 01:47     Procedures   Medications Ordered in the ED  acetaminophen  (TYLENOL ) tablet 650 mg (650 mg Oral Given 04/22/24 0208)                                    Medical Decision Making Amount and/or Complexity of Data Reviewed Radiology: ordered.  Risk OTC drugs.   This patient presents to the ED for concern of fall, this involves an extensive number of treatment options, and is a complaint that carries with it a high risk of complications and morbidity.  The differential diagnosis includes acute injuries   Co morbidities / Chronic conditions that complicate the patient evaluation  HTN, HLD, CKD, IBS, dementia   Additional history obtained:  Additional history obtained from EMR External records from outside source obtained and reviewed including EMS  Imaging Studies ordered:  I ordered imaging  studies including x-ray of chest and pelvis, CT scan of head and cervical spine I independently visualized and interpreted imaging which showed no acute findings I agree with the radiologist interpretation   Cardiac Monitoring: / EKG:  The patient was maintained on a cardiac monitor.  I personally viewed and interpreted the cardiac monitored which showed an underlying rhythm of: Sinus rhythm   Problem List / ED Course / Critical interventions / Medication management  Patient presenting for mechanical fall.  During his fall, she did struck her head.  She is not on blood thinners.  On arrival in the ED, she is well-appearing.  She has mild bruising and swelling to right eyebrow area.  She has no other areas of tenderness or suspected injury based on exam.  She has no focal neurologic deficits.  Tylenol  ordered for analgesia.  Workup was initiated.  Patient underwent imaging studies which did not show acute findings.  On reassessment, she is resting comfortably.   She continues to deny any physical complaints other than some minor tenderness in her right eyebrow area bruising.  Patient was discharged in stable condition. I ordered medication including Tylenol  for analgesia Reevaluation of the patient after these medicines showed that the patient improved I have reviewed the patients home medicines and have made adjustments as needed  Social Determinants of Health:  Has PCP      Final diagnoses:  Fall, initial encounter    ED Discharge Orders     None          Melvenia Motto, MD 04/22/24 [phone]

## 2024-04-22 NOTE — Discharge Instructions (Signed)
 Your imaging studies did not show any major injuries.  Take Tylenol  as needed for pain and soreness.  The bruising on the right side of your head should improve with time.

## 2024-04-27 DIAGNOSIS — E039 Hypothyroidism, unspecified: Secondary | ICD-10-CM | POA: Diagnosis not present

## 2024-04-27 DIAGNOSIS — I10 Essential (primary) hypertension: Secondary | ICD-10-CM | POA: Diagnosis not present

## 2024-04-27 DIAGNOSIS — M6281 Muscle weakness (generalized): Secondary | ICD-10-CM | POA: Diagnosis not present

## 2024-04-27 DIAGNOSIS — W19XXXA Unspecified fall, initial encounter: Secondary | ICD-10-CM | POA: Diagnosis not present

## 2024-04-30 DIAGNOSIS — I10 Essential (primary) hypertension: Secondary | ICD-10-CM | POA: Diagnosis not present

## 2024-04-30 DIAGNOSIS — E039 Hypothyroidism, unspecified: Secondary | ICD-10-CM | POA: Diagnosis not present

## 2024-04-30 DIAGNOSIS — E782 Mixed hyperlipidemia: Secondary | ICD-10-CM | POA: Diagnosis not present

## 2024-05-05 DIAGNOSIS — F411 Generalized anxiety disorder: Secondary | ICD-10-CM | POA: Diagnosis not present

## 2024-05-05 DIAGNOSIS — F5101 Primary insomnia: Secondary | ICD-10-CM | POA: Diagnosis not present

## 2024-05-05 DIAGNOSIS — F0393 Unspecified dementia, unspecified severity, with mood disturbance: Secondary | ICD-10-CM | POA: Diagnosis not present

## 2024-05-08 DIAGNOSIS — Z79899 Other long term (current) drug therapy: Secondary | ICD-10-CM | POA: Diagnosis not present

## 2024-05-08 DIAGNOSIS — R451 Restlessness and agitation: Secondary | ICD-10-CM | POA: Diagnosis not present

## 2024-05-11 ENCOUNTER — Emergency Department (HOSPITAL_COMMUNITY)

## 2024-05-11 ENCOUNTER — Emergency Department (HOSPITAL_COMMUNITY)
Admission: EM | Admit: 2024-05-11 | Discharge: 2024-05-12 | Disposition: A | Discharge status: Home / Self Care | Source: Skilled Nursing Facility | Attending: Emergency Medicine | Admitting: Emergency Medicine

## 2024-05-11 ENCOUNTER — Encounter (HOSPITAL_COMMUNITY): Payer: Self-pay | Admitting: Emergency Medicine

## 2024-05-11 ENCOUNTER — Other Ambulatory Visit: Payer: Self-pay

## 2024-05-11 DIAGNOSIS — Z23 Encounter for immunization: Secondary | ICD-10-CM | POA: Insufficient documentation

## 2024-05-11 DIAGNOSIS — W19XXXA Unspecified fall, initial encounter: Secondary | ICD-10-CM | POA: Insufficient documentation

## 2024-05-11 DIAGNOSIS — Z79899 Other long term (current) drug therapy: Secondary | ICD-10-CM | POA: Diagnosis not present

## 2024-05-11 DIAGNOSIS — I700 Atherosclerosis of aorta: Secondary | ICD-10-CM | POA: Diagnosis not present

## 2024-05-11 DIAGNOSIS — S52021A Displaced fracture of olecranon process without intraarticular extension of right ulna, initial encounter for closed fracture: Secondary | ICD-10-CM | POA: Insufficient documentation

## 2024-05-11 DIAGNOSIS — E039 Hypothyroidism, unspecified: Secondary | ICD-10-CM | POA: Insufficient documentation

## 2024-05-11 DIAGNOSIS — I10 Essential (primary) hypertension: Secondary | ICD-10-CM | POA: Diagnosis not present

## 2024-05-11 DIAGNOSIS — S59901A Unspecified injury of right elbow, initial encounter: Secondary | ICD-10-CM | POA: Diagnosis not present

## 2024-05-11 DIAGNOSIS — M47812 Spondylosis without myelopathy or radiculopathy, cervical region: Secondary | ICD-10-CM | POA: Diagnosis not present

## 2024-05-11 DIAGNOSIS — S42401A Unspecified fracture of lower end of right humerus, initial encounter for closed fracture: Secondary | ICD-10-CM | POA: Diagnosis not present

## 2024-05-11 DIAGNOSIS — S59909A Unspecified injury of unspecified elbow, initial encounter: Secondary | ICD-10-CM | POA: Diagnosis not present

## 2024-05-11 DIAGNOSIS — I517 Cardiomegaly: Secondary | ICD-10-CM | POA: Diagnosis not present

## 2024-05-11 DIAGNOSIS — M25421 Effusion, right elbow: Secondary | ICD-10-CM | POA: Diagnosis not present

## 2024-05-11 DIAGNOSIS — S52031A Displaced fracture of olecranon process with intraarticular extension of right ulna, initial encounter for closed fracture: Secondary | ICD-10-CM | POA: Diagnosis not present

## 2024-05-11 DIAGNOSIS — S199XXA Unspecified injury of neck, initial encounter: Secondary | ICD-10-CM | POA: Diagnosis not present

## 2024-05-11 DIAGNOSIS — F0390 Unspecified dementia without behavioral disturbance: Secondary | ICD-10-CM | POA: Diagnosis not present

## 2024-05-11 DIAGNOSIS — S299XXA Unspecified injury of thorax, initial encounter: Secondary | ICD-10-CM | POA: Diagnosis not present

## 2024-05-11 DIAGNOSIS — S3993XA Unspecified injury of pelvis, initial encounter: Secondary | ICD-10-CM | POA: Diagnosis not present

## 2024-05-11 DIAGNOSIS — Z4682 Encounter for fitting and adjustment of non-vascular catheter: Secondary | ICD-10-CM | POA: Diagnosis not present

## 2024-05-11 DIAGNOSIS — M4802 Spinal stenosis, cervical region: Secondary | ICD-10-CM | POA: Diagnosis not present

## 2024-05-11 LAB — CBC WITH DIFFERENTIAL/PLATELET
Abs Immature Granulocytes: 0.02 K/uL (ref 0.00–0.07)
Basophils Absolute: 0.1 K/uL (ref 0.0–0.1)
Basophils Relative: 1 %
Eosinophils Absolute: 0.2 K/uL (ref 0.0–0.5)
Eosinophils Relative: 2 %
HCT: 31.2 % — ABNORMAL LOW (ref 36.0–46.0)
Hemoglobin: 10 g/dL — ABNORMAL LOW (ref 12.0–15.0)
Immature Granulocytes: 0 %
Lymphocytes Relative: 18 %
Lymphs Abs: 1.3 K/uL (ref 0.7–4.0)
MCH: 30.5 pg (ref 26.0–34.0)
MCHC: 32.1 g/dL (ref 30.0–36.0)
MCV: 95.1 fL (ref 80.0–100.0)
Monocytes Absolute: 1 K/uL (ref 0.1–1.0)
Monocytes Relative: 14 %
Neutro Abs: 4.6 K/uL (ref 1.7–7.7)
Neutrophils Relative %: 65 %
Platelets: 204 K/uL (ref 150–400)
RBC: 3.28 MIL/uL — ABNORMAL LOW (ref 3.87–5.11)
RDW: 12.5 % (ref 11.5–15.5)
WBC: 7.2 K/uL (ref 4.0–10.5)
nRBC: 0 % (ref 0.0–0.2)

## 2024-05-11 LAB — URINALYSIS, W/ REFLEX TO CULTURE (INFECTION SUSPECTED)
Bilirubin Urine: NEGATIVE
Glucose, UA: NEGATIVE mg/dL
Ketones, ur: NEGATIVE mg/dL
Nitrite: NEGATIVE
Protein, ur: NEGATIVE mg/dL
Specific Gravity, Urine: 1.011 (ref 1.005–1.030)
pH: 6 (ref 5.0–8.0)

## 2024-05-11 LAB — COMPREHENSIVE METABOLIC PANEL WITH GFR
ALT: 9 U/L (ref 0–44)
AST: 17 U/L (ref 15–41)
Albumin: 3.4 g/dL — ABNORMAL LOW (ref 3.5–5.0)
Alkaline Phosphatase: 58 U/L (ref 38–126)
Anion gap: 11 (ref 5–15)
BUN: 32 mg/dL — ABNORMAL HIGH (ref 8–23)
CO2: 29 mmol/L (ref 22–32)
Calcium: 9.1 mg/dL (ref 8.9–10.3)
Chloride: 94 mmol/L — ABNORMAL LOW (ref 98–111)
Creatinine, Ser: 1.79 mg/dL — ABNORMAL HIGH (ref 0.44–1.00)
GFR, Estimated: 26 mL/min — ABNORMAL LOW (ref >60)
Glucose, Bld: 121 mg/dL — ABNORMAL HIGH (ref 70–99)
Potassium: 4 mmol/L (ref 3.5–5.1)
Sodium: 134 mmol/L — ABNORMAL LOW (ref 135–145)
Total Bilirubin: 0.6 mg/dL (ref 0.0–1.2)
Total Protein: 6 g/dL — ABNORMAL LOW (ref 6.5–8.1)

## 2024-05-11 MED ORDER — ACETAMINOPHEN 500 MG PO TABS
1000.0000 mg | ORAL_TABLET | Freq: Once | ORAL | Status: AC
  Administered 2024-05-11: 1000 mg via ORAL
  Filled 2024-05-11: qty 2

## 2024-05-11 MED ORDER — TETANUS-DIPHTH-ACELL PERTUSSIS 5-2.5-18.5 LF-MCG/0.5 IM SUSY
0.5000 mL | PREFILLED_SYRINGE | Freq: Once | INTRAMUSCULAR | Status: AC
  Administered 2024-05-11: 0.5 mL via INTRAMUSCULAR
  Filled 2024-05-11: qty 0.5

## 2024-05-11 MED ORDER — FENTANYL CITRATE (PF) 100 MCG/2ML IJ SOLN
50.0000 ug | Freq: Once | INTRAMUSCULAR | Status: AC | PRN
  Administered 2024-05-11: 50 ug via INTRAVENOUS
  Filled 2024-05-11: qty 2

## 2024-05-11 MED ORDER — FENTANYL CITRATE (PF) 100 MCG/2ML IJ SOLN
12.5000 ug | INTRAMUSCULAR | Status: DC | PRN
Start: 2024-05-11 — End: 2024-05-12
  Administered 2024-05-11: 12.5 ug via INTRAVENOUS
  Filled 2024-05-11: qty 2

## 2024-05-11 MED ORDER — LIDOCAINE HCL (PF) 1 % IJ SOLN
10.0000 mL | Freq: Once | INTRAMUSCULAR | Status: AC
  Administered 2024-05-11: 10 mL
  Filled 2024-05-11: qty 10

## 2024-05-11 NOTE — ED Notes (Signed)
 XR at bedside

## 2024-05-11 NOTE — ED Provider Notes (Incomplete)
 [place] EMERGENCY DEPARTMENT AT [facility] Provider Note   CSN: [id number] Arrival date & time: 05/11/24  1927     History Chief Complaint  Patient presents with   Fall    HPI: Brittany Holden is a 89 y.o. female with history pertinent for hypertension, dementia, hypothyroidism, HLD who presents complaining of fall. Patient arrived via EMS.  History provided by patient.  No interpreter required during this encounter.  Patient reports that she [place] the bank when she had a fall and injured her right elbow.  She is unable to remember specifics of the fall, unable to further specify whether or not she had any chest pain or shortness of breath prior to the fall.  Denies any recent fever or chills, abdominal pain.  Reports that current only pain is of her left elbow.  Does not recall when her most recent tetanus shot was updated.  Patient denies use of anticoagulants.  Patient's recorded medical, surgical, social, medication list and allergies were reviewed in the Snapshot window as part of the initial history.   Prior to Admission medications   Medication Sig Start Date End Date Taking? Authorizing Provider  cephALEXin  (KEFLEX ) 500 MG capsule Take 1 capsule (500 mg total) by mouth 3 (three) times daily. 05/12/24  Yes Rogelia Jerilynn RAMAN, MD  amLODipine (NORVASC) 5 MG tablet Take 5 mg by mouth daily.    [provider]  bacitracin  ointment Apply 1 Application topically 2 (two) times daily. Patient not taking: Reported on 10/03/2023 12/07/22   Kommor, Lum, MD  calcitRIOL (ROCALTROL) 0.25 MCG capsule Take 0.25 mcg by mouth 2 (two) times a week. 04/08/23   [provider]  chlorthalidone (HYGROTON) 25 MG tablet Take 25 mg by mouth every other day. 11/02/20 05/05/23  [provider]  Cholecalciferol 25 MCG (1000 UT) tablet Take 400 Units by mouth daily. Patient not taking: Reported on 10/03/2023    [provider]  Cyanocobalamin  (VITAMIN B-12 CR PO) Take  by mouth. Patient not taking: Reported on 05/04/2022    [provider]  donepezil (ARICEPT) 10 MG tablet Take 10 mg by mouth daily. 08/30/23   [provider]  doxycycline  (VIBRAMYCIN ) 100 MG capsule Take 1 capsule (100 mg total) by mouth 2 (two) times daily. 11/17/23   Haze Lonni PARAS, MD  ferrous sulfate 325 (65 FE) MG tablet Take 325 mg by mouth daily with breakfast.    [provider]  hydrochlorothiazide  (MICROZIDE ) 12.5 MG capsule Take 12.5 mg by mouth every morning. Patient not taking: Reported on 10/03/2023 04/08/23   [provider]  levothyroxine  (SYNTHROID , LEVOTHROID) 75 MCG tablet Take 75 mcg by mouth daily.      [provider]  lidocaine  (LIDODERM ) 5 % Place 1 patch onto the skin daily. Remove & Discard patch within 12 hours or as directed by MD Patient not taking: Reported on 10/03/2023 08/20/22   Silver Fell A, PA  metoprolol  succinate (TOPROL  XL) 25 MG 24 hr tablet Take 1 tablet (25 mg total) by mouth daily. 04/20/20   Okey Vina GAILS, MD  Multiple Minerals-Vitamins (CALCIUM & VIT D3 BONE HEALTH PO) Take 1 tablet by mouth daily. Patient not taking: Reported on 05/04/2022    [provider]  mupirocin  ointment (BACTROBAN ) 2 % Apply 1 Application topically daily. Patient not taking: Reported on 10/03/2023 04/12/23   Stuart Vernell Norris, PA-C  predniSONE  (DELTASONE ) 10 MG tablet Take 4 tablets (40 mg total) by mouth daily. Patient not  taking: Reported on 10/03/2023 08/20/22   Silver Fell A, PA  simvastatin  (ZOCOR ) 20 MG tablet Take 20 mg by mouth every morning.     [provider]  sodium zirconium cyclosilicate (LOKELMA) 5 g packet Take 10 g by mouth daily.    [provider]     Allergies: Patient has no known allergies.   Review of Systems   ROS as per HPI  Physical Exam Updated Vital Signs BP (!) 139/93   Pulse 88   Temp 98 F (36.7 C)   Resp 16   Ht 6' (1.829 m)   Wt 59 kg   SpO2 95%    BMI 17.64 kg/m  Physical Exam Vitals and nursing note reviewed.  Constitutional:      General: She is not in acute distress.    Appearance: Normal appearance. She is well-developed.  HENT:     Head: Normocephalic and atraumatic.  Eyes:     Extraocular Movements: Extraocular movements intact.     Conjunctiva/sclera: Conjunctivae normal.  Cardiovascular:     Rate and Rhythm: Normal rate and regular rhythm.     Pulses: Normal pulses.     Heart sounds: No murmur heard. Pulmonary:     Effort: Pulmonary effort is normal. No respiratory distress.     Breath sounds: Normal breath sounds.  Abdominal:     Palpations: Abdomen is soft.     Tenderness: There is no abdominal tenderness.  Musculoskeletal:     Cervical back: Neck supple.     Comments: Significant swelling and overlying skin tear to the right posterior elbow, see image  Skin:    General: Skin is warm and dry.     Capillary Refill: Capillary refill takes less than 2 seconds.  Neurological:     General: No focal deficit present.     Mental Status: She is alert and oriented to person, place, and time.  Psychiatric:        Mood and Affect: Mood normal.        ED Course/ Medical Decision Making/ A&P    Procedures .Ortho Injury Treatment  Date/Time: 05/11/2024 10:23 PM  Performed by: Rogelia Jerilynn RAMAN, MD Authorized by: Rogelia Jerilynn RAMAN, MD   Consent:    Consent obtained:  Verbal   Consent given by:  Patient (son)   Risks discussed:  Restricted joint movement, stiffness and nerve damage   Alternatives discussed:  No treatment and referralInjury location: elbow Location details: right elbow Injury type: fracture Fracture type: olecranon process Pre-procedure neurovascular assessment: neurovascularly intact Pre-procedure distal perfusion: normal Pre-procedure neurological function: normal Pre-procedure range of motion: reduced Anesthesia: hematoma block  Anesthesia: Local anesthesia used: yes Local  Anesthetic: lidocaine  1% without epinephrine  Anesthetic total: 5 mL  Patient sedated: NoManipulation performed: yes Skin traction used: yes Skeletal traction used: no Reduction successful: improved but not anatomic allignment. Immobilization: splint Splint type: long arm Splint Applied by: ED Provider and ED Tech Supplies used: cotton padding, Ortho-Glass and elastic bandage Post-procedure neurovascular assessment: post-procedure neurovascularly intact Post-procedure distal perfusion: normal Post-procedure neurological function: normal Post-procedure range of motion comment: decreaed d/t splint   .Splint Application  Date/Time: 05/11/2024 10:33 PM  Performed by: Rogelia Jerilynn RAMAN, MD Authorized by: Rogelia Jerilynn RAMAN, MD   Consent:    Consent obtained:  Verbal   Consent given by:  Patient (son)   Risks discussed:  Discoloration, numbness and pain   Alternatives discussed:  No treatment Universal protocol:    Imaging studies available: yes  Immediately prior to procedure a time out was called: yes     Patient identity confirmed:  Verbally with patient Pre-procedure details:    Distal neurologic exam:  Normal   Distal perfusion: distal pulses strong and brisk capillary refill   Procedure details:    Location:  Arm   Arm location: long arm.   Strapping: no     Splint type:  Long arm   Supplies:  Cotton padding, elastic bandage and fiberglass   Attestation: Splint applied and adjusted personally by me   Post-procedure details:    Distal neurologic exam:  Normal   Distal perfusion: distal pulses strong     Procedure completion:  Tolerated well, no immediate complications   Post-procedure imaging: reviewed      Medications Ordered in ED Medications  fentaNYL  (SUBLIMAZE ) injection 12.5 mcg (12.5 mcg Intravenous Given 05/11/24 2206)  Tdap (BOOSTRIX ) injection 0.5 mL (0.5 mLs Intramuscular Given 05/11/24 2128)  acetaminophen  (TYLENOL ) tablet 1,000 mg (1,000 mg Oral Given  05/11/24 2129)  lidocaine  (PF) (XYLOCAINE ) 1 % injection 10 mL (10 mLs Infiltration Given by Other 05/11/24 2300)  fentaNYL  (SUBLIMAZE ) injection 50 mcg (50 mcg Intravenous Given 05/11/24 2258)    Medical Decision Making:   LAVONYA HOERNER is a 89 y.o. female who presents for fall as per above.  Physical exam is pertinent for significant swelling and overlying skin tear to the right posterior elbow.   The differential includes but is not limited to dehydration, electrolyte derangement, AKI, ICH, TBI, skull fracture, spinal fracture/dislocation, blunt thoracic trauma, hemothorax, pneumothorax, rib fractures, blunt abdominal trauma, hemorrhage, extremity fracture, dislocation.  Independent historian: None  External data reviewed: No pertinent external data  Labs: Ordered and Independent interpretation CBC: No leukocytosis or thrombocytopenia.  Stable anemia. CMP: Elevated creatinine and BUN is at baseline for patient, no emergent electrolyte derangement or emergent LFT abnormality UA: Small LE and rare bacteria, negative nitrites, not convincing for UTI in the absence of symptoms, leukocytosis not convincing for UTI.  Reflex culture in process  Radiology: Ordered, Independent interpretation, and All images reviewed independently.  Agree with radiology report at this time.   CT head: No ICH or displaced skull fracture CT C-spine: No displaced fracture or dislocation CXR: No acute cardiac or pulmonary abnormality. No appreciable rib fx. No PTX.  Pelvis XR: Pelvic ring intact. No acute fx. Both hips located.  Extremity XR's: I do appreciate displaced olecranon fracture DG Elbow 2 Views Right Result Date: 05/11/2024 CLINICAL DATA:  Post splinting EXAM: RIGHT ELBOW - 2 VIEW COMPARISON:  Earlier today FINDINGS: Interval splint placement. Continued displaced olecranon fracture, with increased displacement since prior study. No subluxation or dislocation. IMPRESSION: In splint views demonstrate increased  displacement across the olecranon fracture. Electronically Signed   By: Franky Crease M.D.   On: 05/11/2024 23:51   DG Elbow 2 Views Right Result Date: 05/11/2024 EXAM: 1 or 2 VIEW(S) XRAY OF THE RIGHT ELBOW COMPARISON: None available. CLINICAL HISTORY: Reeval fx. reeval fx - pre-cast image FINDINGS: BONES AND JOINTS: Comminuted fracture of the olecranon. Improved alignment compared to earlier today with 3 mm of residual distraction. The fracture extends into the elbow joint. SOFT TISSUES: Soft tissue swelling involving the posterior elbow. IMPRESSION: 1. Comminuted Intraarticular Olecranon Fracture. Improved alignment compared with radiograph today. Electronically signed by: Norman Gatlin MD 05/11/2024 11:16 PM EDT RP Workstation: [id number]   CT CERVICAL SPINE WO CONTRAST Result Date: 05/11/2024 EXAM: CT CERVICAL SPINE WITHOUT CONTRAST 05/11/2024 08:26:14 PM TECHNIQUE: CT of  the cervical spine was performed without the administration of intravenous contrast. Multiplanar reformatted images are provided for review. Automated exposure control, iterative reconstruction, and/or weight based adjustment of the mA/kV was utilized to reduce the radiation dose to as low as reasonably achievable. COMPARISON: CT cervical spine dated 04/22/2024. CLINICAL HISTORY: Polytrauma, blunt. Pt c/o right elbow after falling on it about one hour ago. Pt has dementia. FINDINGS: CERVICAL SPINE: BONES AND ALIGNMENT: Diffuse osteopenia. No compression fracture or displaced fracture in the cervical spine. DEGENERATIVE CHANGES: Degenerative endplate osteophytes at multiple levels. Mild intervertebral disc space narrowing throughout the cervical spine. Small disc osteophyte complexes at multiple levels. Facet arthrosis and uncovertebral hypertrophy throughout the cervical spine. Paraminal stenosis is most pronounced at C5-6 and C6-7. No high-grade osseous spinal canal stenosis. SOFT TISSUES: No prevertebral soft tissue swelling. IMPRESSION:  1. No acute abnormality of the cervical spine related to the reported fall. 2. Degenerative changes throughout the cervical spine as above. Electronically signed by: Donnice Mania MD 05/11/2024 09:01 PM EDT RP Workstation: [id number]   CT HEAD WO CONTRAST Result Date: 05/11/2024 EXAM: CT HEAD WITHOUT CONTRAST 05/11/2024 08:26:14 PM TECHNIQUE: CT of the head was performed without the administration of intravenous contrast. Automated exposure control, iterative reconstruction, and/or weight based adjustment of the mA/kV was utilized to reduce the radiation dose to as low as reasonably achievable. COMPARISON: CT head 2025 CLINICAL HISTORY: Head trauma, moderate-severe. Pt c/o right elbow after falling on it about one hour ago. Pt has dementia. FINDINGS: BRAIN AND VENTRICLES: Nonspecific hypoattenuation in the periventricular and subcortical white matter, most likely representing chronic small vessel disease. Generalized parenchymal volume loss. No acute hemorrhage. No evidence of acute infarct. No hydrocephalus. No extra-axial collection. No mass effect or midline shift. ORBITS: No acute abnormality. SINUSES: Complete opacification of the left maxillary sinus with suspected mass versus extension into the adjacent left nasal cavity. SOFT TISSUES AND SKULL: No acute soft tissue abnormality. No skull fracture. VASCULATURE: Atherosclerosis involving the carotid siphons and intracranial vertebral arteries. IMPRESSION: 1. No acute intracranial abnormality related to the head trauma. 2. Chronic small vessel disease and generalized parenchymal volume loss. 3. Complete opacification of the left maxillary sinus with suspected mass/polyp in the adjacent left nasal cavity. Electronically signed by: Donnice Mania MD 05/11/2024 08:56 PM EDT RP Workstation: [id number]   DG Chest Port 1 View Result Date: 05/11/2024 CLINICAL DATA:  Blunt trauma EXAM: PORTABLE CHEST 1 VIEW COMPARISON:  04/22/2024 FINDINGS: No acute airspace disease or  pleural effusion. Stable mild cardiomegaly and aortic atherosclerosis. No pneumothorax. Scarring or atelectasis at the left base. IMPRESSION: No active disease. Mild cardiomegaly. Electronically Signed   By: Luke Bun M.D.   On: 05/11/2024 20:55   DG Pelvis Portable Result Date: 05/11/2024 CLINICAL DATA:  Blunt trauma EXAM: PORTABLE PELVIS 1-2 VIEWS COMPARISON:  04/22/2024 FINDINGS: SI joints are non widened. Pubic symphysis and rami appear intact. No definitive fracture or malalignment. IMPRESSION: No acute osseous abnormality. Electronically Signed   By: Luke Bun M.D.   On: 05/11/2024 20:54   DG Elbow Complete Right Result Date: 05/11/2024 CLINICAL DATA:  Blunt trauma EXAM: RIGHT ELBOW - COMPLETE 3+ VIEW COMPARISON:  None Available. FINDINGS: Acute comminuted and displaced/distracted intra-articular olecranon fracture. Vertical nondisplaced fracture lucency extends to the proximal shaft of the ulna. No radial head dislocation. Large posterior soft tissue swelling and positive for elbow effusion. Vascular calcifications. IMPRESSION: Acute comminuted and displaced/distracted intra-articular olecranon fracture. Electronically Signed   By: Luke Bun HERO.D.  On: 05/11/2024 20:53    EKG/Medicine tests: Ordered and Independent interpretation EKG Interpretation:  Rate 72, sinus rhythm, PVCs, no ST elevations or depression, nonspecific T wave inversions.                Interventions: Tylenol , Tdap  See the EMR for full details regarding lab and imaging results.  Currently, patient is awake, alert, and protecting own airway and is hemodynamically stable.  Patient is unable to specifically remember the fall or why she fell, will obtain screening labs as well as screening chest x-ray, pelvic x-ray.  Additionally given patient's age will obtain CT of the CT head and CT C-spine, as well as screening chest and pelvic x-rays, and focused x-ray of the right elbow.  Tetanus updated, patient administered  Tylenol  initially for pain control.  Patient's screening imaging was reassuring with exception of elbow fracture, patient neurovascularly intact on exam.  Screening labs also unrevealing.  I spoke with Dr. Margrette with orthopedic surgery who recommended splinting, and he will follow-up in clinic later this week.  Patient fracture reduced with some success, and splinted as per procedure notes above, fentanyl  used for analgesia as well as lidocaine  for hematoma block.  Patient did have some recurrent shifting after splinting.  Patient discharged in stable condition with Ortho referral.  Prescription sent for Keflex  given skin tears overlying fracture, however no laceration or puncture component to skin tears concerning for acute open fracture.  Presentation is most consistent with acute complicated illness  Discussion of management or test interpretations with external provider(s): Dr. Margrette, orthopedic surgery  Risk Drugs:OTC drugs, Prescription drug management, and Parenteral controlled substances  Disposition: DISCHARGE: I believe that the patient is safe for discharge home with outpatient follow-up. Patient was informed of all pertinent physical exam, laboratory, and imaging findings. Patient's suspected etiology of their symptom presentation was discussed with the patient and all questions were answered. We discussed following up with PCP, Ortho. I provided thorough ED return precautions. The patient feels safe and comfortable with this plan.  MDM generated using voice dictation software and may contain dictation errors.  Please contact me for any clarification or with any questions.   Clinical Impression:  1. Fall, initial encounter   2. Closed fracture of olecranon process of right ulna, initial encounter      Discharge   Final Clinical Impression(s) / ED Diagnoses Final diagnoses:  Fall, initial encounter  Closed fracture of olecranon process of right ulna, initial encounter     Rx / DC Orders ED Discharge Orders          Ordered    Ambulatory referral to Orthopedic Surgery        05/12/24 0014    cephALEXin  (KEFLEX ) 500 MG capsule  3 times daily        05/12/24 0154             Rogelia Jerilynn RAMAN, MD 05/12/24 0151    Rogelia Jerilynn RAMAN, MD 05/12/24 0155    Rogelia Jerilynn RAMAN, MD 05/12/24 [phone]

## 2024-05-11 NOTE — ED Triage Notes (Signed)
 Pt c/o right elbow after falling on it about one hour ago. Pt has dementia.

## 2024-05-12 MED ORDER — CEPHALEXIN 500 MG PO CAPS: 500.0000 mg | ORAL_CAPSULE | Freq: Three times a day (TID) | ORAL | 0 refills | Status: DC

## 2024-05-12 NOTE — Discharge Instructions (Addendum)
 Inocente ONEIDA Sax  Thank you for allowing us  to take care of you today.  You came to the Emergency Department today because Korinne had a fall earlier today.  Here in the emergency department we did labs and imaging.  She does not have any bleeding in her head, or broken bones in her chest, neck, pelvis.  She did unfortunately break her right elbow.  We also got some screening labs which were reassuring.  We put her in a splint, and we discussed her case with the bone doctors, and they will see her in clinic later this week.  If you do not hear from them within 48 hours regarding an appointment, you can call their clinic to help schedule this appointment.  To-Do: 1. Please follow-up with your primary doctor within 2 days / as soon as possible.   Please return to the Emergency Department or call 911 if you experience have worsening of your symptoms, or do not get better, discoloration of or difficulty moving or feeling the fingers, chest pain, shortness of breath, severe or significantly worsening pain, high fever, severe confusion, pass out or have any reason to think that you need emergency medical care.   We hope you feel better soon.   Mitzie Later, MD Department of Emergency Medicine [facility]

## 2024-05-12 NOTE — ED Notes (Signed)
 Two copies of AVS were provided to pts son Sharolyn. AVS was reviewed with [facility] and caregiver at [facility]. Staff had no additional questions. Additional copy provided to [facility] for SNF staff. AVS was also reviewed with son Sharolyn. Son verbalized understandign with no additional questions at this time.

## 2024-05-12 NOTE — ED Notes (Signed)
 Pt wheelchair to car. Pt's son requests to take pt to facility. SNF staff were informed of pts departure via POV with son.

## 2024-05-13 ENCOUNTER — Telehealth: Payer: Self-pay | Admitting: Orthopedic Surgery

## 2024-05-13 NOTE — Telephone Encounter (Signed)
-----   Message from Taft Minerva sent at 05/11/2024  9:23 PM EDT ----- Call for fu appt

## 2024-05-14 ENCOUNTER — Ambulatory Visit: Admitting: Orthopedic Surgery

## 2024-05-14 ENCOUNTER — Encounter: Payer: Self-pay | Admitting: Orthopedic Surgery

## 2024-05-14 DIAGNOSIS — S52021A Displaced fracture of olecranon process without intraarticular extension of right ulna, initial encounter for closed fracture: Secondary | ICD-10-CM

## 2024-05-14 DIAGNOSIS — S51011A Laceration without foreign body of right elbow, initial encounter: Secondary | ICD-10-CM

## 2024-05-14 NOTE — Progress Notes (Signed)
 LIDOCAINE  1% PF NDC [id number] LOT [id number] EXP 10/31/24

## 2024-05-14 NOTE — Progress Notes (Signed)
  Intake history:  Chief Complaint  Patient presents with   Fracture    R elbow DOI 05/11/24     There were no vitals taken for this visit. There is no height or weight on file to calculate BMI.    WHAT ARE WE SEEING YOU FOR TODAY?   right elbow  How long has this bothered you? (DOI?DOS?WS?)  on 05/11/24  Was there an injury? Yes  Anticoag.  No  Diabetes No  Heart disease No  Hypertension Yes  SMOKING HX No  Kidney disease Yes  Any ALLERGIES ______________________________________________   Treatment:  Have you taken:  Tylenol  Yes  Advil No  Had PT No  Had injection No  Other  _________________________

## 2024-05-14 NOTE — Progress Notes (Signed)
 EMERGENCY ROOM FOLLOW UP  NEW PROBLEM/PATIENT       Office Visit Note   Patient: Brittany Holden           Date of Birth: [birth year]           MRN: [id number] Visit Date: 05/14/2024 Requested by: Shona Norleen PEDLAR, MD [address] [place],  [place] [address] PCP: Shona Norleen PEDLAR, MD  Subjective:  Emergency room record from (date) September 8 has been reviewed and this is included by reference and includes the review of systems with the following addition:   Chief Complaint  Patient presents with   Fracture    R elbow DOI 05/11/24    HPI: 89 year old female with dementia fell fractured right elbow presented to the ER for evaluation and management.  She had a skin tear this was treated with a dressing and then she was placed in a posterior splint.  Repeat x-rays show continued displacement of the olecranon fracture  She comes in today with ecchymosis from the elbow down to the hand extreme edema no signs of infection she can move the fingers but she has the boggy edema and ecchymosis from bleeding under the skin              ROS: Main thing is says she has dementia  Assessment & Plan:  Images personally read and my interpretation : X-ray #1 displaced olecranon fracture right elbow x-ray #2 same displacement  Visit Diagnoses:  1. Closed fracture of olecranon process of right ulna, initial encounter   2. Skin tear of right elbow without complication, initial encounter     Plan:  Wound care with Xeroform dressing new posterior splint wrap to decrease edema  Return 1 week for dressing change wound check resplinting.  Determine if surgical intervention possible with this type of skin issue    No Known Allergies Current Outpatient Medications  Medication Instructions   amLODipine (NORVASC) 5 mg, Oral, Daily   calcitRIOL (ROCALTROL) 0.25 mcg, 2 times weekly   cephALEXin  (KEFLEX ) 500 mg, Oral, 3 times daily   chlorthalidone (HYGROTON) 25 mg, Oral, Every other day    Cholecalciferol 400 Units, Daily   divalproex (DEPAKOTE SPRINKLE) 125 mg, 3 times daily   donepezil (ARICEPT) 10 mg, Oral, Daily   doxycycline  (VIBRAMYCIN ) 100 mg, Oral, 2 times daily   ferrous sulfate 325 mg, Oral, Daily with breakfast   furosemide (LASIX) 40 mg, Daily   hydrochlorothiazide  (MICROZIDE ) 12.5 mg, Every morning   levothyroxine  (SYNTHROID ) 75 mcg, Daily   Lokelma 10 g, Oral, Daily   LORazepam  (ATIVAN ) 0.5 MG tablet Take by mouth.   metoprolol  succinate (TOPROL  XL) 25 mg, Oral, Daily   Multiple Minerals-Vitamins (CALCIUM & VIT D3 BONE HEALTH PO) 1 tablet, Daily   sertraline (ZOLOFT) 25 mg, Daily   simvastatin  (ZOCOR ) 20 mg, Every morning   traZODone (DESYREL) 50 mg, Daily at bedtime     Review of Systems Review of Systems dementia   has a past medical history of Arrhythmia, Cancer (HCC), Dementia (HCC), Diverticulosis of colon (without mention of hemorrhage), Dysrhythmia, Hypertension, Hypothyroidism, IBS (irritable bowel syndrome), Neuropathy, Palpitations, and Personal history of colonic polyps (1999 & 2007).   Past Surgical History:  Procedure Laterality Date   ABDOMINAL HYSTERECTOMY     FOOT ARTHRODESIS     pipj digits  2-4 left foot   ORIF PATELLA Left 11/06/2021   Procedure: OPEN REDUCTION INTERNAL (ORIF) FIXATION PATELLA;  Surgeon: Onesimo Oneil LABOR, MD;  Location: [facility];  Service: Orthopedics;  Laterality: Left;   TENDON REPAIR Right 05/18/2021   Procedure: RIGHT RING AND RIGHT SMALL TENDON TRANSFER VS EXTENSOR INDICIS PROPRIUS TO RING AND SMALL FINGER TRANSFER; DISTAL RADIOULNAR JOINT DEBRIDEMENT ULNA  DISTAL RESECTION WITH STABILIZATION;  Surgeon: Murrell Drivers, MD;  Location: [facility];  Service: Orthopedics;  Laterality: Right;   TONSILLECTOMY  06/26/2012   Procedure: TONSILLECTOMY;  Surgeon: Ana LELON Moccasin, MD;  Location: [facility];  Service: ENT;  Laterality: Right;   VAGINAL HYSTERECTOMY      Family History  Problem Relation Age of Onset   CAD Neg  Hx    Breast cancer Neg Hx     Social History Social History   Tobacco Use   Smoking status: Never   Smokeless tobacco: Never  Vaping Use   Vaping status: Never Used  Substance Use Topics   Alcohol use: No   Drug use: No    No Known Allergies  Current Outpatient Medications  Medication Sig Dispense Refill   divalproex (DEPAKOTE SPRINKLE) 125 MG capsule Take 125 mg by mouth 3 (three) times daily.     furosemide (LASIX) 40 MG tablet Take 40 mg by mouth daily.     LORazepam  (ATIVAN ) 0.5 MG tablet Take by mouth.     sertraline (ZOLOFT) 25 MG tablet Take 25 mg by mouth daily.     traZODone (DESYREL) 50 MG tablet Take 50 mg by mouth at bedtime.     amLODipine (NORVASC) 5 MG tablet Take 5 mg by mouth daily.     calcitRIOL (ROCALTROL) 0.25 MCG capsule Take 0.25 mcg by mouth 2 (two) times a week.     cephALEXin  (KEFLEX ) 500 MG capsule Take 1 capsule (500 mg total) by mouth 3 (three) times daily. 20 capsule 0   chlorthalidone (HYGROTON) 25 MG tablet Take 25 mg by mouth every other day.     Cholecalciferol 25 MCG (1000 UT) tablet Take 400 Units by mouth daily. (Patient not taking: Reported on 10/03/2023)     donepezil (ARICEPT) 10 MG tablet Take 10 mg by mouth daily.     doxycycline  (VIBRAMYCIN ) 100 MG capsule Take 1 capsule (100 mg total) by mouth 2 (two) times daily. 20 capsule 0   ferrous sulfate 325 (65 FE) MG tablet Take 325 mg by mouth daily with breakfast.     hydrochlorothiazide  (MICROZIDE ) 12.5 MG capsule Take 12.5 mg by mouth every morning. (Patient not taking: Reported on 10/03/2023)     levothyroxine  (SYNTHROID , LEVOTHROID) 75 MCG tablet Take 75 mcg by mouth daily.       metoprolol  succinate (TOPROL  XL) 25 MG 24 hr tablet Take 1 tablet (25 mg total) by mouth daily. 90 tablet 3   Multiple Minerals-Vitamins (CALCIUM & VIT D3 BONE HEALTH PO) Take 1 tablet by mouth daily. (Patient not taking: Reported on 05/04/2022)     simvastatin  (ZOCOR ) 20 MG tablet Take 20 mg by mouth every  morning.      sodium zirconium cyclosilicate (LOKELMA) 5 g packet Take 10 g by mouth daily.     No current facility-administered medications for this visit.    Physical Exam There were no vitals taken for this visit. There is no height or weight on file to calculate BMI.  Well developed and well nourished  Stands with normal weight bearing line  Alert and oriented person only Normal affect and mood   GAIT: Ambulatory  Skin shows ecchymosis swelling edema skin tear right elbow tenderness over  the olecranon with defect she actually has extension out to 30 degrees and flexion up to 120 degrees elbow stable muscle tone normal weakness on extension vascular exam normal skin as stated sensation intact (STRIMVS)

## 2024-05-18 DIAGNOSIS — E782 Mixed hyperlipidemia: Secondary | ICD-10-CM | POA: Diagnosis not present

## 2024-05-18 DIAGNOSIS — S52021A Displaced fracture of olecranon process without intraarticular extension of right ulna, initial encounter for closed fracture: Secondary | ICD-10-CM | POA: Diagnosis not present

## 2024-05-18 DIAGNOSIS — D649 Anemia, unspecified: Secondary | ICD-10-CM | POA: Diagnosis not present

## 2024-05-18 DIAGNOSIS — M6281 Muscle weakness (generalized): Secondary | ICD-10-CM | POA: Diagnosis not present

## 2024-05-19 DIAGNOSIS — F0390 Unspecified dementia without behavioral disturbance: Secondary | ICD-10-CM | POA: Diagnosis not present

## 2024-05-19 DIAGNOSIS — F411 Generalized anxiety disorder: Secondary | ICD-10-CM | POA: Diagnosis not present

## 2024-05-20 DIAGNOSIS — S52021A Displaced fracture of olecranon process without intraarticular extension of right ulna, initial encounter for closed fracture: Secondary | ICD-10-CM | POA: Insufficient documentation

## 2024-05-22 ENCOUNTER — Ambulatory Visit (INDEPENDENT_AMBULATORY_CARE_PROVIDER_SITE_OTHER): Admitting: Orthopedic Surgery

## 2024-05-22 ENCOUNTER — Other Ambulatory Visit (INDEPENDENT_AMBULATORY_CARE_PROVIDER_SITE_OTHER): Payer: Self-pay

## 2024-05-22 DIAGNOSIS — S51011D Laceration without foreign body of right elbow, subsequent encounter: Secondary | ICD-10-CM

## 2024-05-22 DIAGNOSIS — S52021D Displaced fracture of olecranon process without intraarticular extension of right ulna, subsequent encounter for closed fracture with routine healing: Secondary | ICD-10-CM

## 2024-05-22 DIAGNOSIS — Z79899 Other long term (current) drug therapy: Secondary | ICD-10-CM | POA: Diagnosis not present

## 2024-05-22 NOTE — Progress Notes (Signed)
   Chief Complaint  Patient presents with   Elbow Injury    Right    89 year old female olecranon fracture displaced  Skin tears  Posttraumatic edema right upper extremity  Follow-up visit  Patient did not wear the splint; she wore the Ace wrap from hand to approximate mid forearm.  A lot of her swelling is gone down.  X-ray today shows a displaced olecranon fracture.  However she can flex her elbow normally and extend to about 30 degrees.  She uses the arm to help push herself up from a seated to standing position to use the walker  I think skillful neglect is the best option at this point based on the patient's age dementia and function  Return as needed

## 2024-06-01 DIAGNOSIS — I10 Essential (primary) hypertension: Secondary | ICD-10-CM | POA: Diagnosis not present

## 2024-06-01 DIAGNOSIS — E039 Hypothyroidism, unspecified: Secondary | ICD-10-CM | POA: Diagnosis not present

## 2024-06-03 DIAGNOSIS — F411 Generalized anxiety disorder: Secondary | ICD-10-CM | POA: Diagnosis not present

## 2024-06-03 DIAGNOSIS — F5101 Primary insomnia: Secondary | ICD-10-CM | POA: Diagnosis not present

## 2024-06-03 DIAGNOSIS — F0393 Unspecified dementia, unspecified severity, with mood disturbance: Secondary | ICD-10-CM | POA: Diagnosis not present

## 2024-06-15 DIAGNOSIS — F0150 Vascular dementia without behavioral disturbance: Secondary | ICD-10-CM | POA: Diagnosis not present

## 2024-06-15 DIAGNOSIS — I10 Essential (primary) hypertension: Secondary | ICD-10-CM | POA: Diagnosis not present

## 2024-06-15 DIAGNOSIS — E039 Hypothyroidism, unspecified: Secondary | ICD-10-CM | POA: Diagnosis not present

## 2024-06-23 ENCOUNTER — Emergency Department (HOSPITAL_COMMUNITY)

## 2024-06-23 ENCOUNTER — Other Ambulatory Visit: Payer: Self-pay

## 2024-06-23 ENCOUNTER — Emergency Department (HOSPITAL_COMMUNITY)
Admission: EM | Admit: 2024-06-23 | Discharge: 2024-06-23 | Disposition: A | Discharge status: Skilled Nursing Facility | Attending: Emergency Medicine | Admitting: Emergency Medicine

## 2024-06-23 ENCOUNTER — Encounter (HOSPITAL_COMMUNITY): Payer: Self-pay

## 2024-06-23 DIAGNOSIS — S0990XA Unspecified injury of head, initial encounter: Secondary | ICD-10-CM | POA: Diagnosis not present

## 2024-06-23 DIAGNOSIS — S0003XA Contusion of scalp, initial encounter: Secondary | ICD-10-CM | POA: Diagnosis not present

## 2024-06-23 DIAGNOSIS — Z043 Encounter for examination and observation following other accident: Secondary | ICD-10-CM | POA: Diagnosis not present

## 2024-06-23 DIAGNOSIS — S0093XA Contusion of unspecified part of head, initial encounter: Secondary | ICD-10-CM | POA: Diagnosis not present

## 2024-06-23 DIAGNOSIS — W19XXXA Unspecified fall, initial encounter: Secondary | ICD-10-CM

## 2024-06-23 DIAGNOSIS — W1809XA Striking against other object with subsequent fall, initial encounter: Secondary | ICD-10-CM | POA: Diagnosis not present

## 2024-06-23 DIAGNOSIS — I6782 Cerebral ischemia: Secondary | ICD-10-CM | POA: Diagnosis not present

## 2024-06-23 DIAGNOSIS — R609 Edema, unspecified: Secondary | ICD-10-CM | POA: Diagnosis not present

## 2024-06-23 DIAGNOSIS — G319 Degenerative disease of nervous system, unspecified: Secondary | ICD-10-CM | POA: Diagnosis not present

## 2024-06-23 DIAGNOSIS — M47817 Spondylosis without myelopathy or radiculopathy, lumbosacral region: Secondary | ICD-10-CM | POA: Diagnosis not present

## 2024-06-23 DIAGNOSIS — R519 Headache, unspecified: Secondary | ICD-10-CM | POA: Diagnosis not present

## 2024-06-23 MED ORDER — LORAZEPAM 0.5 MG PO TABS
0.5000 mg | ORAL_TABLET | Freq: Once | ORAL | Status: AC
  Administered 2024-06-23: 0.5 mg via ORAL
  Filled 2024-06-23: qty 1

## 2024-06-23 NOTE — ED Provider Notes (Signed)
 Patient presenting via EMS after unwitnessed fall at nursing facility.  Her medical history includes dementia, CKD, HLD, HTN, IBS.  This morning, patient was found sitting on her bed.  She informed staff at facility that she had fallen.  They did notice a bump on the back of her head.  EMS was called.  Patient had no complaints with EMS.  Patient denies any current areas of discomfort.  She is not currently on any blood thinners.  On exam, she has good range of motion in all extremities.  Abdomen and chest are nontender.  Imaging studies ordered.  Care of patient to be assumed by oncoming ED provider.   Melvenia Motto, MD 06/23/24 [phone]

## 2024-06-23 NOTE — ED Notes (Signed)
 Son arrives to [facility], pt remains agitated, redirection short lived, emotions labile.

## 2024-06-23 NOTE — ED Notes (Signed)
 Back from radiology. Remains confused, increasingly more anxious with repetitive requests, questons, worries. Updated, reoriented.

## 2024-06-23 NOTE — ED Notes (Signed)
 EDP at [facility]

## 2024-06-23 NOTE — Discharge Instructions (Addendum)
 Take Tylenol  for pain.  Follow-up with your family doctor or Dr. Llewellyn for the mass seen on x-ray in your left maxillary sinus

## 2024-06-23 NOTE — ED Provider Notes (Signed)
 [place] EMERGENCY DEPARTMENT AT [facility] Provider Note   CSN: [id number] Arrival date & time: 06/23/24  [id number]     Patient presents with: No chief complaint on file.   Brittany Holden is a 89 y.o. female.   Patient was at a memory care unit and fell and hit her head.  Unknown loss of consciousness.  Patient complains of pain to the back of her head  The history is provided by the patient and medical records. No language interpreter was used.  Fall This is a new problem. The current episode started 3 to 5 hours ago. The problem has been resolved. Associated symptoms include headaches. Pertinent negatives include no chest pain and no abdominal pain. Nothing aggravates the symptoms. Nothing relieves the symptoms. She has tried nothing for the symptoms. The treatment provided no relief.       Prior to Admission medications   Medication Sig Start Date End Date Taking? Authorizing Provider  amLODipine (NORVASC) 5 MG tablet Take 5 mg by mouth daily.    [provider]  calcitRIOL (ROCALTROL) 0.25 MCG capsule Take 0.25 mcg by mouth 2 (two) times a week. 04/08/23   [provider]  cephALEXin  (KEFLEX ) 500 MG capsule Take 1 capsule (500 mg total) by mouth 3 (three) times daily. 05/12/24   Rogelia Jerilynn RAMAN, MD  chlorthalidone (HYGROTON) 25 MG tablet Take 25 mg by mouth every other day. 11/02/20 05/05/23  [provider]  Cholecalciferol 25 MCG (1000 UT) tablet Take 400 Units by mouth daily. Patient not taking: Reported on 10/03/2023    [provider]  divalproex (DEPAKOTE SPRINKLE) 125 MG capsule Take 125 mg by mouth 3 (three) times daily. 05/07/24   [provider]  donepezil (ARICEPT) 10 MG tablet Take 10 mg by mouth daily. 08/30/23   [provider]  doxycycline  (VIBRAMYCIN ) 100 MG capsule Take 1 capsule (100 mg total) by mouth 2 (two) times daily. 11/17/23   Haze Lonni PARAS, MD  ferrous sulfate 325 (65 FE) MG tablet Take 325  mg by mouth daily with breakfast.    [provider]  furosemide (LASIX) 40 MG tablet Take 40 mg by mouth daily. 05/07/24   [provider]  hydrochlorothiazide  (MICROZIDE ) 12.5 MG capsule Take 12.5 mg by mouth every morning. Patient not taking: Reported on 10/03/2023 04/08/23   [provider]  HYDROcodone -acetaminophen  (NORCO/VICODIN) 5-325 MG tablet Take 1 tablet by mouth daily as needed. 05/19/24   [provider]  levothyroxine  (SYNTHROID , LEVOTHROID) 75 MCG tablet Take 75 mcg by mouth daily.      [provider]  LORazepam  (ATIVAN ) 0.5 MG tablet Take by mouth. 05/07/24   [provider]  metoprolol  succinate (TOPROL  XL) 25 MG 24 hr tablet Take 1 tablet (25 mg total) by mouth daily. 04/20/20   Okey Vina GAILS, MD  Multiple Minerals-Vitamins (CALCIUM & VIT D3 BONE HEALTH PO) Take 1 tablet by mouth daily. Patient not taking: Reported on 05/04/2022    [provider]  sertraline (ZOLOFT) 25 MG tablet Take 25 mg by mouth daily. 05/07/24   [provider]  simvastatin  (ZOCOR ) 20 MG tablet Take 20 mg by mouth every morning.     [provider]  sodium zirconium cyclosilicate (LOKELMA) 5 g packet Take 10 g by mouth daily.    [provider]  torsemide (DEMADEX) 5 MG tablet Take 5 mg by mouth daily. 04/09/24   [provider]  traZODone (DESYREL) 50 MG tablet Take  50 mg by mouth at bedtime. 05/07/24   [provider]    Allergies: Patient has no known allergies.    Review of Systems  Constitutional:  Negative for appetite change and fatigue.  HENT:  Negative for congestion, ear discharge and sinus pressure.        Headache  Eyes:  Negative for discharge.  Respiratory:  Negative for cough.   Cardiovascular:  Negative for chest pain.  Gastrointestinal:  Negative for abdominal pain and diarrhea.  Genitourinary:  Negative for frequency and hematuria.  Musculoskeletal:  Negative for back pain.  Skin:   Negative for rash.  Neurological:  Positive for headaches. Negative for seizures.  Psychiatric/Behavioral:  Negative for hallucinations.     Updated Vital Signs BP (!) 143/90   Pulse 78   Temp 98 F (36.7 C) (Oral)   Resp 18   SpO2 90%   Physical Exam Vitals and nursing note reviewed.  Constitutional:      Appearance: She is well-developed.  HENT:     Head: Normocephalic.     Comments: Tenderness to the occipital area of her head    Nose: Nose normal.  Eyes:     General: No scleral icterus.    Conjunctiva/sclera: Conjunctivae normal.  Neck:     Thyroid : No thyromegaly.  Cardiovascular:     Rate and Rhythm: Normal rate and regular rhythm.     Heart sounds: No murmur heard.    No friction rub. No gallop.  Pulmonary:     Breath sounds: No stridor. No wheezing or rales.  Chest:     Chest wall: No tenderness.  Abdominal:     General: There is no distension.     Tenderness: There is no abdominal tenderness. There is no rebound.  Musculoskeletal:        General: Normal range of motion.     Cervical back: Neck supple.  Lymphadenopathy:     Cervical: No cervical adenopathy.  Skin:    Findings: No erythema or rash.  Neurological:     Mental Status: She is alert.     Motor: No abnormal muscle tone.     Coordination: Coordination normal.     Comments: Oriented to person and place only  Psychiatric:        Behavior: Behavior normal.     (all labs ordered are listed, but only abnormal results are displayed) Labs Reviewed - No data to display  EKG: None  Radiology: DG Pelvis Portable Result Date: 06/23/2024 CLINICAL DATA:  Fall getting out of bed. EXAM: PORTABLE PELVIS 1-2 VIEWS COMPARISON:  05/11/2024 FINDINGS: There is diffuse decreased bone mineralization. No acute fracture or dislocation. Mild degenerative changes of the spine and sacroiliac joints. Exam is otherwise unchanged. IMPRESSION: 1. No acute findings. 2. Mild degenerative changes of the spine and  sacroiliac joints. Electronically Signed   By: Toribio Agreste M.D.   On: 06/23/2024 07:54   DG Chest Port 1 View Result Date: 06/23/2024 CLINICAL DATA:  Fall getting out of bed. EXAM: PORTABLE CHEST 1 VIEW COMPARISON:  05/11/2024 FINDINGS: Lungs are adequately inflated without focal airspace consolidation, effusion or pneumothorax. Cardiomediastinal silhouette is normal. Mild degenerative change of the spine. No acute fracture. IMPRESSION: No active disease. Electronically Signed   By: Toribio Agreste M.D.   On: 06/23/2024 07:53   CT HEAD WO CONTRAST Result Date: 06/23/2024 EXAM: CT HEAD AND CERVICAL SPINE 06/23/2024 07:11:43 AM TECHNIQUE: CT of the head and cervical spine was performed without the administration of  intravenous contrast. Multiplanar reformatted images are provided for review. Automated exposure control, iterative reconstruction, and/or weight based adjustment of the mA/kV was utilized to reduce the radiation dose to as low as reasonably achievable. COMPARISON: 05/11/2024 CLINICAL HISTORY: Head trauma, moderate-severe. Fall when getting out of bed. Patient with hematoma to back of head. Patient is not on any blood thinners. FINDINGS: CT HEAD BRAIN AND VENTRICLES: No acute intracranial hemorrhage. No mass effect or midline shift. No abnormal extra-axial fluid collection. No evidence of acute infarct. No hydrocephalus. Hypoattenuating foci in the cerebral white matter, most likely representing chronic small vessel disease. Prominence of the sulci and ventricles compatible with brain atrophy. ORBITS: No acute abnormality. SINUSES AND MASTOIDS: Asymmetric opacification of the left maxillary sinus with an expansile appearance extending into the adjacent left nasal cavity. The remaining paranasal sinuses are clear. Mastoid air cells are clear. SOFT TISSUES AND SKULL: No acute skull fracture. No acute soft tissue abnormality. CT CERVICAL SPINE BONES AND ALIGNMENT: No acute fracture or traumatic  malalignment. DEGENERATIVE CHANGES: Disc space narrowing and endplate spurring identified at C4-C5, C5-C6, and C6-C7. SOFT TISSUES: No prevertebral soft tissue swelling. IMPRESSION: 1. No acute intracranial abnormality. 2. No evidence for a cervical spine fracture or subluxation. 3. Chronic small vessel ischemic disease and brain atrophy. 4. Cervical spondylosis. 5. Asymmetric expansile opacification of the left maxillary sinus extending into the adjacent left nasal cavity, suspicious for sinonasal mass or obstructive process. This is similar to the comparison exam from 05/11/2024 Electronically signed by: Waddell Calk MD 06/23/2024 07:29 AM EDT RP Workstation: [id number]   CT CERVICAL SPINE WO CONTRAST Result Date: 06/23/2024 EXAM: CT HEAD AND CERVICAL SPINE 06/23/2024 07:11:43 AM TECHNIQUE: CT of the head and cervical spine was performed without the administration of intravenous contrast. Multiplanar reformatted images are provided for review. Automated exposure control, iterative reconstruction, and/or weight based adjustment of the mA/kV was utilized to reduce the radiation dose to as low as reasonably achievable. COMPARISON: 05/11/2024 CLINICAL HISTORY: Head trauma, moderate-severe. Fall when getting out of bed. Patient with hematoma to back of head. Patient is not on any blood thinners. FINDINGS: CT HEAD BRAIN AND VENTRICLES: No acute intracranial hemorrhage. No mass effect or midline shift. No abnormal extra-axial fluid collection. No evidence of acute infarct. No hydrocephalus. Hypoattenuating foci in the cerebral white matter, most likely representing chronic small vessel disease. Prominence of the sulci and ventricles compatible with brain atrophy. ORBITS: No acute abnormality. SINUSES AND MASTOIDS: Asymmetric opacification of the left maxillary sinus with an expansile appearance extending into the adjacent left nasal cavity. The remaining paranasal sinuses are clear. Mastoid air cells are clear. SOFT  TISSUES AND SKULL: No acute skull fracture. No acute soft tissue abnormality. CT CERVICAL SPINE BONES AND ALIGNMENT: No acute fracture or traumatic malalignment. DEGENERATIVE CHANGES: Disc space narrowing and endplate spurring identified at C4-C5, C5-C6, and C6-C7. SOFT TISSUES: No prevertebral soft tissue swelling. IMPRESSION: 1. No acute intracranial abnormality. 2. No evidence for a cervical spine fracture or subluxation. 3. Chronic small vessel ischemic disease and brain atrophy. 4. Cervical spondylosis. 5. Asymmetric expansile opacification of the left maxillary sinus extending into the adjacent left nasal cavity, suspicious for sinonasal mass or obstructive process. This is similar to the comparison exam from 05/11/2024 Electronically signed by: Waddell Calk MD 06/23/2024 07:29 AM EDT RP Workstation: [id number]     Procedures   Medications Ordered in the ED  LORazepam  (ATIVAN ) tablet 0.5 mg (0.5 mg Oral Given 06/23/24 0759)  Medical Decision Making Risk Prescription drug management.   Patient with contusion to the back of her head.  CT head cervical spine and plain films unremarkable.  Although patient does seem to have some type of mass her left maxillary area that was there before.  She will take Tylenol  for pain and follow-up with ENT for evaluation of that mass     Final diagnoses:  Fall, initial encounter    ED Discharge Orders     None          Suzette Pac, MD 06/23/24 [id number]

## 2024-06-23 NOTE — ED Notes (Signed)
 Pt alert, NAD, calm, interactive, resps e/u, speaking in clear complete sentences. H/o dementia. Coming from [facility]. BIB [facility] for unwitnessed reported fall. Pt found sitting on her bed stating I fell. Endorses hitting posterior head. Small hematoma posterior head. Pt rubbing head. VSS. CBG 144. BP 149/80, HR 52, SPO2 92%. No blood thinners. DNR present. MAR present. EDP present on arrival.

## 2024-06-23 NOTE — ED Notes (Signed)
 Pt to radiology.

## 2024-06-23 NOTE — ED Triage Notes (Signed)
 Pt via [facility] from [facility] with fall when getting out of bed. Pt with hematoma to back on head. Pt is not on any blood thinners.

## 2024-06-24 DIAGNOSIS — N390 Urinary tract infection, site not specified: Secondary | ICD-10-CM | POA: Diagnosis not present

## 2024-06-24 DIAGNOSIS — R4182 Altered mental status, unspecified: Secondary | ICD-10-CM | POA: Diagnosis not present

## 2024-07-02 DIAGNOSIS — F5101 Primary insomnia: Secondary | ICD-10-CM | POA: Diagnosis not present

## 2024-07-02 DIAGNOSIS — F0393 Unspecified dementia, unspecified severity, with mood disturbance: Secondary | ICD-10-CM | POA: Diagnosis not present

## 2024-07-02 DIAGNOSIS — F411 Generalized anxiety disorder: Secondary | ICD-10-CM | POA: Diagnosis not present

## 2024-07-03 DIAGNOSIS — E039 Hypothyroidism, unspecified: Secondary | ICD-10-CM | POA: Diagnosis not present

## 2024-07-03 DIAGNOSIS — I10 Essential (primary) hypertension: Secondary | ICD-10-CM | POA: Diagnosis not present

## 2024-07-10 DIAGNOSIS — N189 Chronic kidney disease, unspecified: Secondary | ICD-10-CM | POA: Diagnosis not present

## 2024-07-10 DIAGNOSIS — D631 Anemia in chronic kidney disease: Secondary | ICD-10-CM | POA: Diagnosis not present

## 2024-07-10 DIAGNOSIS — R809 Proteinuria, unspecified: Secondary | ICD-10-CM | POA: Diagnosis not present

## 2024-07-10 DIAGNOSIS — D649 Anemia, unspecified: Secondary | ICD-10-CM | POA: Diagnosis not present

## 2024-07-13 DIAGNOSIS — I10 Essential (primary) hypertension: Secondary | ICD-10-CM | POA: Diagnosis not present

## 2024-07-13 DIAGNOSIS — R600 Localized edema: Secondary | ICD-10-CM | POA: Diagnosis not present

## 2024-07-13 DIAGNOSIS — E782 Mixed hyperlipidemia: Secondary | ICD-10-CM | POA: Diagnosis not present

## 2024-07-13 DIAGNOSIS — I959 Hypotension, unspecified: Secondary | ICD-10-CM | POA: Diagnosis not present

## 2024-07-17 DIAGNOSIS — N2581 Secondary hyperparathyroidism of renal origin: Secondary | ICD-10-CM | POA: Diagnosis not present

## 2024-07-17 DIAGNOSIS — I129 Hypertensive chronic kidney disease with stage 1 through stage 4 chronic kidney disease, or unspecified chronic kidney disease: Secondary | ICD-10-CM | POA: Diagnosis not present

## 2024-07-17 DIAGNOSIS — I5032 Chronic diastolic (congestive) heart failure: Secondary | ICD-10-CM | POA: Diagnosis not present

## 2024-07-17 DIAGNOSIS — D638 Anemia in other chronic diseases classified elsewhere: Secondary | ICD-10-CM | POA: Diagnosis not present

## 2024-07-28 DIAGNOSIS — F5101 Primary insomnia: Secondary | ICD-10-CM | POA: Diagnosis not present

## 2024-07-28 DIAGNOSIS — F0393 Unspecified dementia, unspecified severity, with mood disturbance: Secondary | ICD-10-CM | POA: Diagnosis not present

## 2024-07-28 DIAGNOSIS — F411 Generalized anxiety disorder: Secondary | ICD-10-CM | POA: Diagnosis not present

## 2024-08-10 DIAGNOSIS — E039 Hypothyroidism, unspecified: Secondary | ICD-10-CM | POA: Diagnosis not present

## 2024-08-10 DIAGNOSIS — E782 Mixed hyperlipidemia: Secondary | ICD-10-CM | POA: Diagnosis not present

## 2024-08-10 DIAGNOSIS — R930 Abnormal findings on diagnostic imaging of skull and head, not elsewhere classified: Secondary | ICD-10-CM | POA: Diagnosis not present

## 2024-08-12 ENCOUNTER — Encounter (INDEPENDENT_AMBULATORY_CARE_PROVIDER_SITE_OTHER): Payer: Self-pay

## 2024-08-24 ENCOUNTER — Telehealth (INDEPENDENT_AMBULATORY_CARE_PROVIDER_SITE_OTHER): Payer: Self-pay

## 2024-08-24 NOTE — Telephone Encounter (Signed)
 I called patient's son back regarding telephone visit and explained that we actually cannot do a telephone visit for what she is being seen for. I explained that she could come into the office as previously scheduled. I also explained that he could contact [facility] as an alternative and let him know that I can send him the phone number via MyChart. He understood.

## 2024-08-24 NOTE — Telephone Encounter (Signed)
 Patient's son called regarding patient's appointment on 08/28/2024. He explains that patient has dementia and does not want to leave her care facility. He states that one of her scans showed that she had a sinus mass. I explained that she would most likely have to come in because Dr. Anice would usually like to do a nasal endoscopy for that. I said that I would ask and see if she could be seen by telephone visit. Please advise.

## 2024-08-24 NOTE — Telephone Encounter (Signed)
 Called patient's son to see if they had a phone that's capable of doing video calls. Patient's son said that they do and that they would like to make a telephone visit. Please get them scheduled for a telephone visit instead of the original office visit.

## 2024-08-28 ENCOUNTER — Ambulatory Visit (INDEPENDENT_AMBULATORY_CARE_PROVIDER_SITE_OTHER)

## 2024-08-28 ENCOUNTER — Encounter (INDEPENDENT_AMBULATORY_CARE_PROVIDER_SITE_OTHER): Payer: Self-pay

## 2024-08-28 VITALS — BP 145/93 | HR 77 | Temp 98.0°F | Wt 130.0 lb

## 2024-08-28 DIAGNOSIS — J3489 Other specified disorders of nose and nasal sinuses: Secondary | ICD-10-CM | POA: Diagnosis not present

## 2024-08-28 DIAGNOSIS — H903 Sensorineural hearing loss, bilateral: Secondary | ICD-10-CM | POA: Diagnosis not present

## 2024-08-28 NOTE — Progress Notes (Signed)
 Dear Dr. Leontine, Here is my assessment for our mutual patient, Brittany Holden. Thank you for allowing me the opportunity to care for your patient. Please do not hesitate to contact me should you have any other questions. Sincerely, Dr. Penne Croak  Otolaryngology Clinic Note Referring provider: Dr. Leontine HPI:  Discussed the use of AI scribe software for clinical note transcription with the patient, who gave verbal consent to proceed.  History of Present Illness [name] is a 89 year old female who presents for evaluation of a left nasal mass causing unilateral nasal obstruction.  Nasal mass and obstruction - Left nasal mass incidentally identified on CT head and CT spine performed after a recent fall - Onset of symptoms is unclear - Unilateral nasal obstruction with difficulty breathing through the left nasal passage - No epistaxis or oral bleeding  Perioperative considerations - Son prefers to avoid general anesthesia for any future procedures due to advanced age  History provided by Son.   Independent Review of Additional Tests or Records:  Reviewed external note from referring PCP, Nguyen,describing relevant history incorporated into todays evaluation. 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 Limited exam.   PMH/Meds/All/SocHx/FamHx/ROS:   Past Medical History:  Diagnosis Date   Arrhythmia    Cancer (HCC)    Dementia (HCC)    Diverticulosis of colon (without mention of hemorrhage)    Dysrhythmia    Hypertension    Hypothyroidism    IBS (irritable bowel syndrome)    Neuropathy    Palpitations    Personal history of colonic polyps 1999 & 2007   hyperplastic      Past Surgical History:  Procedure Laterality Date   ABDOMINAL HYSTERECTOMY     FOOT ARTHRODESIS     pipj digits  2-4 left foot   ORIF PATELLA Left 11/06/2021   Procedure: OPEN REDUCTION INTERNAL (ORIF) FIXATION PATELLA;  Surgeon: Onesimo Oneil LABOR, MD;  Location: [facility];  Service: Orthopedics;  Laterality: Left;   TENDON REPAIR Right 05/18/2021   Procedure: RIGHT RING AND RIGHT SMALL TENDON TRANSFER VS EXTENSOR INDICIS PROPRIUS TO RING AND SMALL FINGER TRANSFER; DISTAL RADIOULNAR JOINT DEBRIDEMENT ULNA  DISTAL RESECTION WITH STABILIZATION;  Surgeon: Murrell Drivers, MD;  Location: [facility];  Service: Orthopedics;  Laterality: Right;   TONSILLECTOMY  06/26/2012   Procedure: TONSILLECTOMY;  Surgeon: Ana LELON Moccasin, MD;  Location: [facility];  Service: ENT;  Laterality: Right;   VAGINAL HYSTERECTOMY      Family History  Problem Relation Age of Onset   CAD Neg Hx    Breast cancer Neg Hx      Social Connections: Not on file     Current Medications[1]   Physical Exam:   BP (!) 145/93 (BP Location: Left Arm, Patient Position: Sitting, Cuff Size: Normal)   Pulse 77   Temp 98 F (36.7 C)   Wt 130 lb (59 kg)   SpO2 95%   BMI 17.63 kg/m   The patient was awake, alert, and appropriate. The external ears were inspected, and otoscopy was performed to evaluate the external auditory canals and tympanic membranes. The nasal cavity and septum were examined for mucosal changes, obstruction, or discharge. The oral cavity and oropharynx were inspected for mucosal lesions, infection, or tonsillar hypertrophy. The neck was palpated for lymphadenopathy, thyroid  abnormalities, or other masses. Cranial nerve function was grossly intact.  Pertinent Findings: General: Well developed, well nourished. No acute distress. Voice without  hoarseness Head/Face: Normocephalic. No sinus tenderness. Facial nerve intact and equal bilaterally. No facial lacerations. Eyes: PERRL, no scleral icterus or conjunctival hemorrhage. EOMI. Ears: No gross deformity. Normal external canal. Tympanic membrane with normal landmarks bilaterally Hearing: decreased speech reception.  Nose: DNS, left nare with polypoid mass, no increased vascularity appreciated. No  blood. Turbinate hypertrophy.  Mouth/Oropharynx: Lips without any lesions. Dentition missing teeth. No mucosal lesions within the oropharynx. No tonsillar enlargement, exudate, or lesions. Pharyngeal walls symmetrical. Uvula midline. Tongue midline without lesions. Gait unstable. Physical Exam HEENT: Atraumatic, normocephalic. Nose with left nair polypoid tissue.   Seprately Identifiable Procedures:  I personally ordered, reviewed and interpreted the following with the patient today  Given the patient's symptoms and incomplete visualization of critical sinonasal areas with anterior rhinoscopy, a separately performed diagnostic nasal endoscopy procedure is indicated for a complete rhinologic evaluation per American Rhinologic Society recommendations (https://[email or web address]-rhinologic.org/position-statements)  I personally ordered, reviewed and interpreted the following with the patient today  Procedure Note Diagnostic Nasal Endoscopy CPT CODE -- 68768 - Mod 25  Prior to initiating any procedures, risks/benefits/alternatives were explained to the patient and verbal consent obtained.  Pre-procedure diagnosis: Concern for mass Post-procedure diagnosis: same Indication: See pre-procedure diagnosis and physical exam above Complications: None apparent EBL: 0 mL Anesthesia: Lidocaine  4% and topical decongestant was topically sprayed in each nasal cavity  Description of Procedure:  Patient was identified. A flexible fiberoptic endoscope was utilized to evaluate the sinonasal cavities, mucosa, sinus ostia and turbinates and septum.  Overall, signs of mucosal inflammation are noted.  Also noted are obstructed left middle meatus with polypoid mass.     Right Middle meatus: clear Right SE Recess: clear Left MM: obstructed Left SE Recess: unable to visualize Photodocumentation was obtained.   Impression & Plans:  Brittany Holden is a 89 y.o. female  1. Nasal mass   2. Maxillary sinus mass   3.  Sensorineural hearing loss (SNHL) of both ears    - Findings and diagnoses discussed in detail with the patient. - Risks, benefits, and alternatives were reviewed. Through shared decision making, the patients son elects to proceed with below. Assessment & Plan Left nasal mass Left nasal mass with unilateral nasal obstruction and polypoid tissue on endoscopy. Further evaluation needed. - CT scan of nasal region for mass characterization. - Follow-up visit post-imaging to review results with her son. - Potential office biopsy next visit if indicated, avoiding general anesthesia.  SNHL - Audiogram- schedule at earliest convenience  - Orders placed:  Orders Placed This Encounter  Procedures   CT SINUS WO CONTRAST   Ambulatory referral to Audiology   - Medications prescribed/continued/adjusted: No orders of the defined types were placed in this encounter.  - Education materials provided to the patient. - Follow up: after imaging and discuss biopsy at next visit. Patient instructed to return sooner or go to the ED if new/worsening symptoms develop.   Thank you for allowing me the opportunity to care for your patient. Please do not hesitate to contact me should you have any other questions.  Sincerely, Penne Croak, DO Otolaryngologist (ENT) [facility] Phone: [phone] Fax: [phone]  08/29/2024, 12:47 PM        [1]  Current Outpatient Medications:    amLODipine (NORVASC) 5 MG tablet, Take 5 mg by mouth daily., Disp: , Rfl:    calcitRIOL (ROCALTROL) 0.25 MCG capsule, Take 0.25 mcg by mouth 2 (two) times a week., Disp: , Rfl:    cephALEXin  (KEFLEX ) 500  MG capsule, Take 1 capsule (500 mg total) by mouth 3 (three) times daily., Disp: 20 capsule, Rfl: 0   chlorthalidone (HYGROTON) 25 MG tablet, Take 25 mg by mouth every other day., Disp: , Rfl:    divalproex (DEPAKOTE SPRINKLE) 125 MG capsule, Take 125 mg by mouth 3 (three) times daily., Disp: , Rfl:     donepezil (ARICEPT) 10 MG tablet, Take 10 mg by mouth daily., Disp: , Rfl:    doxycycline  (VIBRAMYCIN ) 100 MG capsule, Take 1 capsule (100 mg total) by mouth 2 (two) times daily., Disp: 20 capsule, Rfl: 0   ferrous sulfate 325 (65 FE) MG tablet, Take 325 mg by mouth daily with breakfast., Disp: , Rfl:    furosemide (LASIX) 40 MG tablet, Take 40 mg by mouth daily., Disp: , Rfl:    HYDROcodone -acetaminophen  (NORCO/VICODIN) 5-325 MG tablet, Take 1 tablet by mouth daily as needed., Disp: , Rfl:    levothyroxine  (SYNTHROID , LEVOTHROID) 75 MCG tablet, Take 75 mcg by mouth daily.  , Disp: , Rfl:    LORazepam  (ATIVAN ) 0.5 MG tablet, Take by mouth., Disp: , Rfl:    metoprolol  succinate (TOPROL  XL) 25 MG 24 hr tablet, Take 1 tablet (25 mg total) by mouth daily., Disp: 90 tablet, Rfl: 3   sertraline (ZOLOFT) 25 MG tablet, Take 25 mg by mouth daily., Disp: , Rfl:    simvastatin  (ZOCOR ) 20 MG tablet, Take 20 mg by mouth every morning. , Disp: , Rfl:    sodium zirconium cyclosilicate (LOKELMA) 5 g packet, Take 10 g by mouth daily., Disp: , Rfl:    torsemide (DEMADEX) 5 MG tablet, Take 5 mg by mouth daily., Disp: , Rfl:    traZODone (DESYREL) 50 MG tablet, Take 50 mg by mouth at bedtime., Disp: , Rfl:    Cholecalciferol 25 MCG (1000 UT) tablet, Take 400 Units by mouth daily. (Patient not taking: Reported on 08/28/2024), Disp: , Rfl:    hydrochlorothiazide  (MICROZIDE ) 12.5 MG capsule, Take 12.5 mg by mouth every morning. (Patient not taking: Reported on 08/28/2024), Disp: , Rfl:    Multiple Minerals-Vitamins (CALCIUM & VIT D3 BONE HEALTH PO), Take 1 tablet by mouth daily. (Patient not taking: Reported on 08/28/2024), Disp: , Rfl:

## 2024-08-30 ENCOUNTER — Emergency Department (HOSPITAL_COMMUNITY)

## 2024-08-30 ENCOUNTER — Emergency Department (HOSPITAL_COMMUNITY)
Admission: EM | Admit: 2024-08-30 | Discharge: 2024-08-30 | Disposition: A | Discharge status: Nursing Home (Includes ICF) | Source: Skilled Nursing Facility | Attending: Emergency Medicine | Admitting: Emergency Medicine

## 2024-08-30 ENCOUNTER — Other Ambulatory Visit: Payer: Self-pay

## 2024-08-30 DIAGNOSIS — J320 Chronic maxillary sinusitis: Secondary | ICD-10-CM | POA: Insufficient documentation

## 2024-08-30 DIAGNOSIS — W19XXXA Unspecified fall, initial encounter: Secondary | ICD-10-CM

## 2024-08-30 DIAGNOSIS — J3489 Other specified disorders of nose and nasal sinuses: Secondary | ICD-10-CM

## 2024-08-30 DIAGNOSIS — W010XXA Fall on same level from slipping, tripping and stumbling without subsequent striking against object, initial encounter: Secondary | ICD-10-CM | POA: Insufficient documentation

## 2024-08-30 DIAGNOSIS — S0990XA Unspecified injury of head, initial encounter: Secondary | ICD-10-CM | POA: Insufficient documentation

## 2024-08-30 NOTE — ED Triage Notes (Signed)
 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 Hx of dementia, states no pain. Contusion to left forehead. No diabetic. Per EMT, Facility said pt is on bld thinners however not seen on paperwork.

## 2024-08-30 NOTE — Discharge Instructions (Signed)
 You were seen for your fall in the emergency department.   At home, please take Tylenol  for your pain.  Ice your forehead to limit the swelling.    Check your MyChart online for the results of any tests that had not resulted by the time you left the emergency department.   Follow-up with your primary doctor in 2-3 days regarding your visit.  Follow-up with the ENT doctors about your sinus mass  Return immediately to the emergency department if you experience any of the following: Severe headache, vomiting, difficulty breathing, or any other concerning symptoms.    Thank you for visiting our Emergency Department. It was a pleasure taking care of you today.

## 2024-08-30 NOTE — ED Provider Notes (Signed)
 " [place] EMERGENCY DEPARTMENT AT [facility] Provider Note   CSN: [id number] Arrival date & time: 08/30/24  0845     Patient presents with: Brittany Holden is a 89 y.o. female.   89 year old female history of dementia and maxillary sinus mass who presents emergency department after a fall.  History obtained per EMS and the patient's son since patient is unable to provide history.  She was in the dining area at [employer] when she tripped and fell.  Hit the left side of her forehead.  No LOC.  No vomiting.  Not on blood thinners.  Denies pain elsewhere.       Prior to Admission medications  Medication Sig Start Date End Date Taking? Authorizing Provider  amLODipine (NORVASC) 5 MG tablet Take 5 mg by mouth daily.    [provider]  calcitRIOL (ROCALTROL) 0.25 MCG capsule Take 0.25 mcg by mouth 2 (two) times a week. 04/08/23   [provider]  cephALEXin  (KEFLEX ) 500 MG capsule Take 1 capsule (500 mg total) by mouth 3 (three) times daily. 05/12/24   Rogelia Jerilynn RAMAN, MD  chlorthalidone (HYGROTON) 25 MG tablet Take 25 mg by mouth every other day. 11/02/20 08/28/24  [provider]  Cholecalciferol 25 MCG (1000 UT) tablet Take 400 Units by mouth daily. Patient not taking: Reported on 08/28/2024    [provider]  divalproex (DEPAKOTE SPRINKLE) 125 MG capsule Take 125 mg by mouth 3 (three) times daily. 05/07/24   [provider]  donepezil (ARICEPT) 10 MG tablet Take 10 mg by mouth daily. 08/30/23   [provider]  doxycycline  (VIBRAMYCIN ) 100 MG capsule Take 1 capsule (100 mg total) by mouth 2 (two) times daily. 11/17/23   Haze Lonni PARAS, MD  ferrous sulfate 325 (65 FE) MG tablet Take 325 mg by mouth daily with breakfast.    [provider]  furosemide (LASIX) 40 MG tablet Take 40 mg by mouth daily. 05/07/24   [provider]  hydrochlorothiazide  (MICROZIDE ) 12.5 MG capsule Take 12.5 mg by mouth  every morning. Patient not taking: Reported on 08/28/2024 04/08/23   [provider]  HYDROcodone -acetaminophen  (NORCO/VICODIN) 5-325 MG tablet Take 1 tablet by mouth daily as needed. 05/19/24   [provider]  levothyroxine  (SYNTHROID , LEVOTHROID) 75 MCG tablet Take 75 mcg by mouth daily.      [provider]  LORazepam  (ATIVAN ) 0.5 MG tablet Take by mouth. 05/07/24   [provider]  metoprolol  succinate (TOPROL  XL) 25 MG 24 hr tablet Take 1 tablet (25 mg total) by mouth daily. 04/20/20   Okey Vina GAILS, MD  Multiple Minerals-Vitamins (CALCIUM & VIT D3 BONE HEALTH PO) Take 1 tablet by mouth daily. Patient not taking: Reported on 08/28/2024    [provider]  sertraline (ZOLOFT) 25 MG tablet Take 25 mg by mouth daily. 05/07/24   [provider]  simvastatin  (ZOCOR ) 20 MG tablet Take 20 mg by mouth every morning.     [provider]  sodium zirconium cyclosilicate (LOKELMA) 5 g packet Take 10 g by mouth daily.    [provider]  torsemide (DEMADEX) 5 MG tablet Take 5 mg by mouth daily. 04/09/24   [provider]  traZODone (DESYREL) 50 MG tablet Take 50 mg by mouth at bedtime. 05/07/24   [provider]    Allergies: Patient has no known allergies.    Review of Systems  Updated Vital Signs BP 109/84  Pulse 73   Temp 97.9 F (36.6 C) (Oral)   Ht 6' 1 (1.854 m)   Wt 62.6 kg Comment: per son, Brittany Holden  SpO2 92%   BMI 18.21 kg/m   Physical Exam Constitutional:      General: She is not in acute distress.    Appearance: Normal appearance. She is not ill-appearing.  HENT:     Head: Normocephalic.     Comments: Small hematoma to left temple    Right Ear: External ear normal.     Left Ear: External ear normal.     Mouth/Throat:     Mouth: Mucous membranes are moist.     Pharynx: Oropharynx is clear.  Eyes:     Extraocular Movements: Extraocular movements intact.     Conjunctiva/sclera: Conjunctivae  normal.     Pupils: Pupils are equal, round, and reactive to light.     Comments: Pupils 5 mm bilaterally  Neck:     Comments: No C-spine midline tenderness to palpation Cardiovascular:     Rate and Rhythm: Normal rate and regular rhythm.     Pulses: Normal pulses.     Heart sounds: Normal heart sounds.  Pulmonary:     Effort: Pulmonary effort is normal. No respiratory distress.     Breath sounds: Normal breath sounds.  Abdominal:     General: Abdomen is flat. There is no distension.     Palpations: Abdomen is soft. There is no mass.     Tenderness: There is no abdominal tenderness. There is no guarding.  Musculoskeletal:        General: No deformity. Normal range of motion.     Cervical back: No rigidity or tenderness.     Comments: No tenderness to palpation of midline thoracic or lumbar spine.  No step-offs palpated.  No tenderness to palpation of chest wall.  No bruising noted.  No tenderness to palpation of bilateral clavicles.  No tenderness to palpation, bruising, or deformities noted of bilateral shoulders, elbows, wrists, hips, knees, or ankles.  Neurological:     General: No focal deficit present.     Mental Status: She is alert. Mental status is at baseline.     Cranial Nerves: No cranial nerve deficit.     Sensory: No sensory deficit.     Motor: No weakness.     (all labs ordered are listed, but only abnormal results are displayed) Labs Reviewed - No data to display  EKG: None  Radiology: CT Maxillofacial Wo Contrast Result Date: 08/30/2024 EXAM: CT OF THE FACE WITHOUT CONTRAST 08/30/2024 09:45:29 AM TECHNIQUE: CT of the face was performed without the administration of intravenous contrast. Multiplanar reformatted images are provided for review. Automated exposure control, iterative reconstruction, and/or weight based adjustment of the mA/kV was utilized to reduce the radiation dose to as low as reasonably achievable. COMPARISON: CT head 06/23/2024 and same day  CT head. CLINICAL HISTORY: fall FINDINGS: FACIAL BONES: Bilateral nasal bone deformities, favored to be chronic, with no overlying soft tissue swelling. Mild lateral apex angulation of the right nasal bone. There are no findings to suggest acute maxillofacial fractures. Edentulous maxilla with associated chronic bone loss. Degenerative changes of the temporomandibular joints bilaterally. No mandibular dislocation. No suspicious bone lesion. ORBITS: Globes are intact. Postsurgical changes of both globes. Mild left periorbital soft tissue swelling, particularly along the superolateral aspects of the left orbit. No acute traumatic injury. No inflammatory change. SINUSES AND MASTOIDS: Complete opacification of the left maxillary sinus with a mildly expanded  appearance. Mild thickening of the maxillary sinus walls suggestive of mucoperiosteal reaction. Internal hyperattenuating components are present, which may reflect fungal colonization versus inspissated secretions. Soft tissue is noted extending through and expanding the antrum of the left maxillary sinus with abnormal soft tissue within the left nasal cavity partially extending into the left nasopharynx. Underlying antrochoanal polyp or other sinonasal mass cannot be excluded. Recommend nonemergent ENT evaluation. Additional mild mucosal thickening in the left ethmoid sinus. No air fluid levels. No acute abnormality of the mastoids. SOFT TISSUES: No acute abnormality. IMPRESSION: 1. No acute maxillofacial fracture. 2. Chronic-appearing bilateral nasal bone deformities. 3. Mild left periorbital soft tissue swelling. 4. Complete opacification of the left maxillary sinus. Abnormal soft tissue extends into the left nasal cavity and partially into the left nasopharynx. Underlying antrochoanal polyp or other sinonasal mass cannot be excluded. Recommend nonemergent ENT evaluation. Electronically signed by: Donnice Mania MD 08/30/2024 10:44 AM EST RP Workstation: [id number]    CT Cervical Spine Wo Contrast Result Date: 08/30/2024 EXAM: CT CERVICAL SPINE WITHOUT CONTRAST 08/30/2024 09:45:29 AM TECHNIQUE: CT of the cervical spine was performed without the administration of intravenous contrast. Multiplanar reformatted images are provided for review. Automated exposure control, iterative reconstruction, and/or weight based adjustment of the mA/kV was utilized to reduce the radiation dose to as low as reasonably achievable. COMPARISON: 06/23/2024 CLINICAL HISTORY: fall FINDINGS: BONES AND ALIGNMENT: No acute fracture or traumatic malalignment. DEGENERATIVE CHANGES: Degenerative endplate osteophytes and disc space narrowing at multiple levels. Facet arthrosis and uncovertebral hypertrophy are present at multiple levels. Foraminal stenosis is most pronounced at C5-C6. There is no high grade osseous spinal canal stenosis. SOFT TISSUES: No prevertebral soft tissue swelling. VASCULATURE: Atherosclerosis at the carotid bifurcations. IMPRESSION: 1. No evidence of acute traumatic injury. Electronically signed by: Donnice Mania MD 08/30/2024 10:33 AM EST RP Workstation: [id number]   CT Head Wo Contrast Result Date: 08/30/2024 EXAM: CT HEAD WITHOUT CONTRAST 08/30/2024 09:45:29 AM TECHNIQUE: CT of the head was performed without the administration of intravenous contrast. Automated exposure control, iterative reconstruction, and/or weight based adjustment of the mA/kV was utilized to reduce the radiation dose to as low as reasonably achievable. COMPARISON: 06/23/2024 CLINICAL HISTORY: fall FINDINGS: BRAIN AND VENTRICLES: No acute hemorrhage. No evidence of acute infarct. No hydrocephalus. No extra-axial collection. No mass effect or midline shift. Age-related atrophy and periventricular white matter disease. There is slight left temporal predominance of volume loss. Vascular calcifications. ORBITS: Postsurgical changes in both globes. SINUSES: There is complete opacification of the left  maxillary sinus with internal areas of hyperattenuation which may reflect fungal colonization or inspissated secretions. There is soft tissue extending through and likely expanding the antrum of the maxillary sinus; underlying antrochoanal polyp cannot be excluded. SOFT TISSUES AND SKULL: Mild soft tissue swelling over the left supraorbital ridge and along the superior and lateral aspects of the left orbit. No skull fracture. IMPRESSION: 1. No acute intracranial abnormality. 2. Mild left periorbital soft tissue swelling. 3. Complete opacification of the left maxillary sinus with internal hyperattenuation, possibly reflecting fungal colonization or inspissated secretions. Soft tissue extends through and expands the antrum, underlying antrochoanal polyp cannot be excluded. Electronically signed by: Donnice Mania MD 08/30/2024 10:27 AM EST RP Workstation: [id number]     Procedures   Medications Ordered in the ED - No data to display  Medical Decision Making Amount and/or Complexity of Data Reviewed Radiology: ordered.   89 year old female history of dementia sinus mass who presents emergency department after a fall with left facial trauma  Initial Ddx:  TBI, C-spine injury, facial fracture  MDM/Course:  Patient presents emergency department after a fall.  Appear to be mechanical in nature.  On exam does have a bruise to her left temple.  No laceration present.  No other injuries on exam.  Had a CT head, max face, and C-spine that did not show acute injury aside from a superficial hematoma but did show the maxillary sinus mass which they are aware of.  They will follow-up with ENT about this.  Upon reevaluation patient remained stable.  This patient presents to the ED for concern of complaints listed in HPI, this involves an extensive number of treatment options, and is a complaint that carries with it a high risk of complications and morbidity. Disposition  including potential need for admission considered.   Dispo: DC Home. Return precautions discussed including, but not limited to, those listed in the AVS. Allowed pt time to ask questions which were answered fully prior to dc.  I have reviewed the patients home medications and made adjustments as needed Additional history obtained from son Records reviewed Outpatient Clinic Notes I independently reviewed the following imaging with scope of interpretation limited to determining acute life threatening conditions related to emergency care: CT Head and agree with the radiologist interpretation with the following exceptions: none I personally reviewed and interpreted cardiac monitoring: normal sinus rhythm  I personally reviewed and interpreted the pt's EKG: see above for interpretation  Social Determinants of health:  Geriatric  Portions of this note were generated with Scientist, clinical (histocompatibility and immunogenetics). Dictation errors may occur despite best attempts at proofreading.     Final diagnoses:  Fall, initial encounter  Minor head injury, initial encounter  Maxillary sinus mass    ED Discharge Orders     None          Yolande Lamar BROCKS, MD 08/30/24 1927  "

## 2024-08-30 NOTE — ED Notes (Signed)
Pt ambulated to the BR with 1 assist.

## 2024-09-02 ENCOUNTER — Telehealth (INDEPENDENT_AMBULATORY_CARE_PROVIDER_SITE_OTHER): Payer: Self-pay

## 2024-09-02 NOTE — Telephone Encounter (Signed)
 Patient's son called stated she fell the other day the hospital did CT scans they are in her chart. Patient's son also stated they are concerned the mass is causing patient to be off balanced, patient has been falling quite a bit lately. Son requested a call back.

## 2024-09-04 NOTE — Telephone Encounter (Signed)
 Spoke to patient's son Sharolyn for 5 minutes this afternoon. I recommended biopsy. However, patient has been falling more lately and I advised that that should take precedence. I reviewed her CT from Jan 2025 and the mass was present then. This has been in her sinus and nose for at least 12 months. He is going to talk to Darci and try to bring her in for an office biopsy.

## 2024-09-08 ENCOUNTER — Observation Stay (HOSPITAL_COMMUNITY)
Admission: EM | Admit: 2024-09-08 | Discharge: 2024-09-09 | Disposition: A | Discharge status: Skilled Nursing Facility | Attending: Internal Medicine | Admitting: Internal Medicine

## 2024-09-08 ENCOUNTER — Emergency Department (HOSPITAL_COMMUNITY)

## 2024-09-08 ENCOUNTER — Encounter (HOSPITAL_COMMUNITY): Payer: Self-pay | Admitting: Emergency Medicine

## 2024-09-08 ENCOUNTER — Other Ambulatory Visit: Payer: Self-pay

## 2024-09-08 DIAGNOSIS — Z79899 Other long term (current) drug therapy: Secondary | ICD-10-CM | POA: Insufficient documentation

## 2024-09-08 DIAGNOSIS — R7989 Other specified abnormal findings of blood chemistry: Secondary | ICD-10-CM | POA: Insufficient documentation

## 2024-09-08 DIAGNOSIS — J101 Influenza due to other identified influenza virus with other respiratory manifestations: Secondary | ICD-10-CM | POA: Insufficient documentation

## 2024-09-08 DIAGNOSIS — J9600 Acute respiratory failure, unspecified whether with hypoxia or hypercapnia: Secondary | ICD-10-CM | POA: Diagnosis not present

## 2024-09-08 DIAGNOSIS — I083 Combined rheumatic disorders of mitral, aortic and tricuspid valves: Secondary | ICD-10-CM | POA: Diagnosis not present

## 2024-09-08 DIAGNOSIS — I129 Hypertensive chronic kidney disease with stage 1 through stage 4 chronic kidney disease, or unspecified chronic kidney disease: Secondary | ICD-10-CM | POA: Insufficient documentation

## 2024-09-08 DIAGNOSIS — E039 Hypothyroidism, unspecified: Secondary | ICD-10-CM | POA: Insufficient documentation

## 2024-09-08 DIAGNOSIS — R55 Syncope and collapse: Secondary | ICD-10-CM | POA: Diagnosis present

## 2024-09-08 DIAGNOSIS — J111 Influenza due to unidentified influenza virus with other respiratory manifestations: Principal | ICD-10-CM

## 2024-09-08 DIAGNOSIS — F0390 Unspecified dementia without behavioral disturbance: Secondary | ICD-10-CM | POA: Insufficient documentation

## 2024-09-08 DIAGNOSIS — N184 Chronic kidney disease, stage 4 (severe): Secondary | ICD-10-CM | POA: Diagnosis not present

## 2024-09-08 LAB — CBC
HCT: 38.2 % (ref 36.0–46.0)
Hemoglobin: 12.2 g/dL (ref 12.0–15.0)
MCH: 30.1 pg (ref 26.0–34.0)
MCHC: 31.9 g/dL (ref 30.0–36.0)
MCV: 94.3 fL (ref 80.0–100.0)
Platelets: 200 K/uL (ref 150–400)
RBC: 4.05 MIL/uL (ref 3.87–5.11)
RDW: 12.6 % (ref 11.5–15.5)
WBC: 7 K/uL (ref 4.0–10.5)
nRBC: 0 % (ref 0.0–0.2)

## 2024-09-08 LAB — BASIC METABOLIC PANEL WITH GFR
Anion gap: 8 (ref 5–15)
BUN: 24 mg/dL — ABNORMAL HIGH (ref 8–23)
CO2: 32 mmol/L (ref 22–32)
Calcium: 9.2 mg/dL (ref 8.9–10.3)
Chloride: 97 mmol/L — ABNORMAL LOW (ref 98–111)
Creatinine, Ser: 1.48 mg/dL — ABNORMAL HIGH (ref 0.44–1.00)
GFR, Estimated: 33 mL/min — ABNORMAL LOW
Glucose, Bld: 114 mg/dL — ABNORMAL HIGH (ref 70–99)
Potassium: 4.2 mmol/L (ref 3.5–5.1)
Sodium: 137 mmol/L (ref 135–145)

## 2024-09-08 MED ORDER — ACETAMINOPHEN 500 MG PO TABS: 500.0000 mg | ORAL_TABLET | Freq: Four times a day (QID) | ORAL | Status: DC | PRN

## 2024-09-08 MED ORDER — OSELTAMIVIR PHOSPHATE 75 MG PO CAPS: 75.0000 mg | ORAL_CAPSULE | Freq: Two times a day (BID) | ORAL | 0 refills | Status: DC

## 2024-09-08 MED ORDER — MELATONIN 3 MG PO TABS: 6.0000 mg | ORAL_TABLET | Freq: Every evening | ORAL | Status: DC | PRN

## 2024-09-08 MED ORDER — PROCHLORPERAZINE EDISYLATE 10 MG/2ML IJ SOLN: 5.0000 mg | Freq: Four times a day (QID) | INTRAMUSCULAR | Status: DC | PRN

## 2024-09-08 MED ORDER — GUAIFENESIN-DM 100-10 MG/5ML PO SYRP: 5.0000 mL | ORAL_SOLUTION | ORAL | Status: DC | PRN

## 2024-09-08 MED ORDER — ACETAMINOPHEN 650 MG RE SUPP
650.0000 mg | Freq: Once | RECTAL | Status: AC
  Administered 2024-09-08: 650 mg via RECTAL
  Filled 2024-09-08: qty 1

## 2024-09-08 MED ORDER — POLYETHYLENE GLYCOL 3350 17 G PO PACK: 17.0000 g | PACK | Freq: Every day | ORAL | Status: DC | PRN

## 2024-09-08 MED ORDER — OSELTAMIVIR PHOSPHATE 30 MG PO CAPS
30.0000 mg | ORAL_CAPSULE | Freq: Every day | ORAL | Status: DC
  Administered 2024-09-09: 30 mg via ORAL
  Filled 2024-09-08: qty 1

## 2024-09-08 MED ORDER — IPRATROPIUM-ALBUTEROL 0.5-2.5 (3) MG/3ML IN SOLN: 3.0000 mL | RESPIRATORY_TRACT | Status: DC | PRN

## 2024-09-08 MED ORDER — LEVOTHYROXINE SODIUM 50 MCG PO TABS: 75.0000 ug | ORAL_TABLET | Freq: Every day | ORAL | Status: DC

## 2024-09-08 MED ORDER — HEPARIN SODIUM (PORCINE) 5000 UNIT/ML IJ SOLN: 5000.0000 [IU] | Freq: Three times a day (TID) | INTRAMUSCULAR | Status: DC

## 2024-09-08 NOTE — ED Notes (Signed)
 [facility] called for transport back to [facility]. Brittany Holden

## 2024-09-08 NOTE — ED Notes (Signed)
Pt given ice water for fluid challenge.  

## 2024-09-08 NOTE — ED Provider Notes (Cosign Needed Addendum)
 " [place] EMERGENCY DEPARTMENT AT [facility] Provider Note   CSN: [id number] Arrival date & time: 09/08/24  1725     Patient presents with: Fever   Brittany Holden is a 89 y.o. female with a history including chronic kidney disease, hypertension, thyroidism, dementia presenting from her local SNF where has had increased generalized weakness today, tested positive for influenza.  Patient refused taking Tylenol  prior to arrival and she was sent for further evaluation.  Also there was concern for hypoxia, according to the son at bedside her oxygen level dropped to 92% on room air prior to arrival.  He states she had no symptoms yesterday.   The history is provided by a relative (Son at bedside). The history is limited by the condition of the patient (Patient has dementia and cannot give a meaningful history).       Prior to Admission medications  Medication Sig Start Date End Date Taking? Authorizing Provider  amLODipine (NORVASC) 5 MG tablet Take 5 mg by mouth daily.    [provider]  calcitRIOL (ROCALTROL) 0.25 MCG capsule Take 0.25 mcg by mouth 2 (two) times a week. 04/08/23   [provider]  cephALEXin  (KEFLEX ) 500 MG capsule Take 1 capsule (500 mg total) by mouth 3 (three) times daily. 05/12/24   Rogelia Jerilynn RAMAN, MD  chlorthalidone (HYGROTON) 25 MG tablet Take 25 mg by mouth every other day. 11/02/20 08/28/24  [provider]  Cholecalciferol 25 MCG (1000 UT) tablet Take 400 Units by mouth daily. Patient not taking: Reported on 08/28/2024    [provider]  divalproex (DEPAKOTE SPRINKLE) 125 MG capsule Take 125 mg by mouth 3 (three) times daily. 05/07/24   [provider]  donepezil  (ARICEPT ) 10 MG tablet Take 10 mg by mouth daily. 08/30/23   [provider]  doxycycline  (VIBRAMYCIN ) 100 MG capsule Take 1 capsule (100 mg total) by mouth 2 (two) times daily. 11/17/23   Haze Lonni PARAS, MD  ferrous sulfate 325 (65 FE)  MG tablet Take 325 mg by mouth daily with breakfast.    [provider]  furosemide (LASIX) 40 MG tablet Take 40 mg by mouth daily. 05/07/24   [provider]  hydrochlorothiazide  (MICROZIDE ) 12.5 MG capsule Take 12.5 mg by mouth every morning. Patient not taking: Reported on 08/28/2024 04/08/23   [provider]  HYDROcodone -acetaminophen  (NORCO/VICODIN) 5-325 MG tablet Take 1 tablet by mouth daily as needed. 05/19/24   [provider]  levothyroxine  (SYNTHROID , LEVOTHROID) 75 MCG tablet Take 75 mcg by mouth daily.      [provider]  LORazepam  (ATIVAN ) 0.5 MG tablet Take by mouth. 05/07/24   [provider]  metoprolol  succinate (TOPROL  XL) 25 MG 24 hr tablet Take 1 tablet (25 mg total) by mouth daily. 04/20/20   Okey Vina GAILS, MD  Multiple Minerals-Vitamins (CALCIUM & VIT D3 BONE HEALTH PO) Take 1 tablet by mouth daily. Patient not taking: Reported on 08/28/2024    [provider]  sertraline (ZOLOFT) 25 MG tablet Take 25 mg by mouth daily. 05/07/24   [provider]  simvastatin  (ZOCOR ) 20 MG tablet Take 20 mg by mouth every morning.     [provider]  sodium zirconium cyclosilicate (LOKELMA) 5 g packet Take 10 g by mouth daily.    [provider]  torsemide (DEMADEX) 5 MG tablet Take 5 mg by mouth daily. 04/09/24   [provider]  traZODone (DESYREL) 50 MG tablet Take 50  mg by mouth at bedtime. 05/07/24   [provider]    Allergies: Patient has no known allergies.    Review of Systems  Unable to perform ROS: Dementia  Constitutional:  Positive for fever.  Respiratory:  Positive for cough.     Updated Vital Signs BP (!) 164/83   Pulse 82   Temp 100 F (37.8 C) (Oral)   Resp 18   Ht 6' 1 (1.854 m)   Wt 62.6 kg   SpO2 94%   BMI 18.21 kg/m   Physical Exam Vitals and nursing note reviewed.  Constitutional:      Appearance: She is well-developed.     Comments: Pleasantly  confused,  cooperative.  HENT:     Head: Normocephalic and atraumatic.     Comments: Old bruising left cheek (son states she fell last week,  was seen here)    Mouth/Throat:     Pharynx: Oropharynx is clear.  Eyes:     Conjunctiva/sclera: Conjunctivae normal.  Cardiovascular:     Rate and Rhythm: Normal rate and regular rhythm.     Heart sounds: Normal heart sounds.  Pulmonary:     Effort: Pulmonary effort is normal.     Breath sounds: No wheezing or rhonchi.     Comments: Dry sounding cough. Abdominal:     General: Bowel sounds are normal.     Palpations: Abdomen is soft.     Tenderness: There is no abdominal tenderness. There is no guarding.  Musculoskeletal:        General: Normal range of motion.     Cervical back: Normal range of motion.  Skin:    General: Skin is warm and dry.  Neurological:     Mental Status: She is alert.     (all labs ordered are listed, but only abnormal results are displayed) Labs Reviewed  CBC  BASIC METABOLIC PANEL WITH GFR    EKG: None  Radiology: DG Chest Portable 1 View Result Date: 09/08/2024 EXAM: 1 VIEW(S) XRAY OF THE CHEST 09/08/2024 06:35:00 PM COMPARISON: 06/23/2024 CLINICAL HISTORY: influenza positive, transient hypoxia FINDINGS: LUNGS AND PLEURA: Chronic coarsened markings. No pulmonary edema. No pleural effusion. No pneumothorax. HEART AND MEDIASTINUM: Atherosclerotic plaque noted. No acute abnormality of the cardiac and mediastinal silhouettes. BONES AND SOFT TISSUES: Osteopenia. IMPRESSION: 1. No acute findings. 2. Chronic coarsened pulmonary markings without pulmonary edema. Electronically signed by: Elsie Gravely MD 09/08/2024 06:58 PM EST RP Workstation: [id number]     Procedures   Medications Ordered in the ED  levothyroxine  (SYNTHROID ) tablet 75 mcg (has no administration in time range)  heparin  injection 5,000 Units (has no administration in time range)  acetaminophen  (TYLENOL ) tablet 500 mg (has no administration in  time range)  prochlorperazine  (COMPAZINE ) injection 5 mg (has no administration in time range)  melatonin tablet 6 mg (has no administration in time range)  polyethylene glycol (MIRALAX  / GLYCOLAX ) packet 17 g (has no administration in time range)  oseltamivir  (TAMIFLU ) capsule 30 mg (has no administration in time range)  ipratropium-albuterol  (DUONEB) 0.5-2.5 (3) MG/3ML nebulizer solution 3 mL (has no administration in time range)  guaiFENesin -dextromethorphan (ROBITUSSIN DM) 100-10 MG/5ML syrup 5 mL (has no administration in time range)  acetaminophen  (TYLENOL ) suppository 650 mg (650 mg Rectal Given 09/08/24 1837)                                    Medical Decision Making Patient  with a history significant for dementia presenting from her nursing home facility with a new diagnosis of influenza today.  Having episodic hypoxia.  She does intermittently appear to have increased work of breathing, although she is not wheezing or in any prolonged respiratory distress.  Her oxygen level is in the low to mid 90s at rest, when she coughs or when she drifts off to sleep her oxygen sat has drops to mid 80's.  Had one episode of desaturation to mid 70's when sleeping.  However,  upon readjustment of oxygen sensor,  oxygen significantly improved to normal range.  Suspect pt may have been fidgeting with her sensor causing poor reads.  She is not wheezing on exam.  Oxygen was turned off,  observed in the department with no further episodes of desaturation.   Amount and/or Complexity of Data Reviewed Labs: ordered. Radiology: ordered.    Details: Chest x-ray reviewed, no acute cardiopulmonary findings, no pulmonary edema, coarsened pulmonary markings are present. Discussion of management or test interpretation with external provider(s): Patient was discussed with Dr. Shona who accepts patient for admission  Risk OTC drugs. Decision regarding hospitalization.        Final diagnoses:  Influenza     ED Discharge Orders     None          Birdena Mliss RIGGERS 09/08/24 2242    Addilyne Backs, PA-C 09/08/24 2327  "

## 2024-09-08 NOTE — ED Notes (Signed)
 Pt family asking if pt will be discharged or admitted[facility]WOOD Cl[place], [place] made aware- a/w response.

## 2024-09-08 NOTE — ED Notes (Signed)
 Pt will be admitted per [place], [place]

## 2024-09-08 NOTE — ED Notes (Signed)
 Just spoke to Marianna and updated to pt's disposition.

## 2024-09-08 NOTE — Discharge Instructions (Addendum)
 Rest, make sure you are getting plenty of oral fluids to avoid dehydration.  Take the entire course of the Tamiflu  prescribed.  Get rechecked for any increased weakness or shortness of breath.

## 2024-09-08 NOTE — ED Triage Notes (Signed)
 Pt bib [facility] from [facility] for fever. Pt tested positive of flu a today. Pt has dementia and would not take tylenol  so they sent her to be evaluated.

## 2024-09-08 NOTE — ED Notes (Signed)
 Pt tolerating water.

## 2024-09-08 NOTE — ED Notes (Signed)
 Pt trying to pull pulse ox off finger, removed and placed on pt earlobe

## 2024-09-08 NOTE — ED Notes (Signed)
 ED Provider at bedside.

## 2024-09-09 ENCOUNTER — Other Ambulatory Visit: Payer: Self-pay

## 2024-09-09 ENCOUNTER — Emergency Department (HOSPITAL_COMMUNITY)

## 2024-09-09 ENCOUNTER — Encounter (HOSPITAL_COMMUNITY): Payer: Self-pay

## 2024-09-09 ENCOUNTER — Observation Stay (HOSPITAL_COMMUNITY)
Admission: EM | Admit: 2024-09-09 | Discharge: 2024-09-11 | Disposition: A | Discharge status: Skilled Nursing Facility | Source: Skilled Nursing Facility | Attending: Family Medicine | Admitting: Family Medicine

## 2024-09-09 DIAGNOSIS — N184 Chronic kidney disease, stage 4 (severe): Secondary | ICD-10-CM | POA: Insufficient documentation

## 2024-09-09 DIAGNOSIS — J9601 Acute respiratory failure with hypoxia: Secondary | ICD-10-CM | POA: Insufficient documentation

## 2024-09-09 DIAGNOSIS — F0390 Unspecified dementia without behavioral disturbance: Secondary | ICD-10-CM | POA: Insufficient documentation

## 2024-09-09 DIAGNOSIS — I129 Hypertensive chronic kidney disease with stage 1 through stage 4 chronic kidney disease, or unspecified chronic kidney disease: Secondary | ICD-10-CM | POA: Insufficient documentation

## 2024-09-09 DIAGNOSIS — J111 Influenza due to unidentified influenza virus with other respiratory manifestations: Principal | ICD-10-CM | POA: Insufficient documentation

## 2024-09-09 DIAGNOSIS — Z79899 Other long term (current) drug therapy: Secondary | ICD-10-CM | POA: Insufficient documentation

## 2024-09-09 DIAGNOSIS — E860 Dehydration: Secondary | ICD-10-CM

## 2024-09-09 DIAGNOSIS — R0602 Shortness of breath: Secondary | ICD-10-CM | POA: Insufficient documentation

## 2024-09-09 DIAGNOSIS — I10 Essential (primary) hypertension: Secondary | ICD-10-CM

## 2024-09-09 DIAGNOSIS — E039 Hypothyroidism, unspecified: Secondary | ICD-10-CM | POA: Insufficient documentation

## 2024-09-09 DIAGNOSIS — D696 Thrombocytopenia, unspecified: Secondary | ICD-10-CM | POA: Insufficient documentation

## 2024-09-09 DIAGNOSIS — R55 Syncope and collapse: Secondary | ICD-10-CM | POA: Insufficient documentation

## 2024-09-09 LAB — CBC WITH DIFFERENTIAL/PLATELET
Abs Immature Granulocytes: 0.01 K/uL (ref 0.00–0.07)
Basophils Absolute: 0 K/uL (ref 0.0–0.1)
Basophils Relative: 1 %
Eosinophils Absolute: 0 K/uL (ref 0.0–0.5)
Eosinophils Relative: 0 %
HCT: 47.7 % — ABNORMAL HIGH (ref 36.0–46.0)
Hemoglobin: 15.4 g/dL — ABNORMAL HIGH (ref 12.0–15.0)
Immature Granulocytes: 0 %
Lymphocytes Relative: 13 %
Lymphs Abs: 0.6 K/uL — ABNORMAL LOW (ref 0.7–4.0)
MCH: 30.2 pg (ref 26.0–34.0)
MCHC: 32.3 g/dL (ref 30.0–36.0)
MCV: 93.5 fL (ref 80.0–100.0)
Monocytes Absolute: 0.7 K/uL (ref 0.1–1.0)
Monocytes Relative: 14 %
Neutro Abs: 3.5 K/uL (ref 1.7–7.7)
Neutrophils Relative %: 72 %
Platelets: 119 K/uL — ABNORMAL LOW (ref 150–400)
RBC: 5.1 MIL/uL (ref 3.87–5.11)
RDW: 12.4 % (ref 11.5–15.5)
WBC: 4.8 K/uL (ref 4.0–10.5)
nRBC: 0 % (ref 0.0–0.2)

## 2024-09-09 LAB — COMPREHENSIVE METABOLIC PANEL WITH GFR
ALT: 14 U/L (ref 0–44)
AST: 43 U/L — ABNORMAL HIGH (ref 15–41)
Albumin: 4 g/dL (ref 3.5–5.0)
Alkaline Phosphatase: 64 U/L (ref 38–126)
Anion gap: 19 — ABNORMAL HIGH (ref 5–15)
BUN: 31 mg/dL — ABNORMAL HIGH (ref 8–23)
CO2: 20 mmol/L — ABNORMAL LOW (ref 22–32)
Calcium: 9 mg/dL (ref 8.9–10.3)
Chloride: 96 mmol/L — ABNORMAL LOW (ref 98–111)
Creatinine, Ser: 1.69 mg/dL — ABNORMAL HIGH (ref 0.44–1.00)
GFR, Estimated: 28 mL/min — ABNORMAL LOW
Glucose, Bld: 169 mg/dL — ABNORMAL HIGH (ref 70–99)
Potassium: 3.4 mmol/L — ABNORMAL LOW (ref 3.5–5.1)
Sodium: 135 mmol/L (ref 135–145)
Total Bilirubin: 0.4 mg/dL (ref 0.0–1.2)
Total Protein: 6.6 g/dL (ref 6.5–8.1)

## 2024-09-09 LAB — RESP PANEL BY RT-PCR (RSV, FLU A&B, COVID)  RVPGX2
Influenza A by PCR: POSITIVE — AB
Influenza B by PCR: NEGATIVE
Resp Syncytial Virus by PCR: NEGATIVE
SARS Coronavirus 2 by RT PCR: NEGATIVE

## 2024-09-09 LAB — TROPONIN T, HIGH SENSITIVITY
Troponin T High Sensitivity: 45 ng/L — ABNORMAL HIGH (ref 0–19)
Troponin T High Sensitivity: 43 ng/L — ABNORMAL HIGH (ref 0–19)

## 2024-09-09 LAB — MAGNESIUM: Magnesium: 2.2 mg/dL (ref 1.7–2.4)

## 2024-09-09 LAB — PRO BRAIN NATRIURETIC PEPTIDE: Pro Brain Natriuretic Peptide: 2478 pg/mL — ABNORMAL HIGH

## 2024-09-09 MED ORDER — ACETAMINOPHEN 650 MG RE SUPP: 650.0000 mg | Freq: Four times a day (QID) | RECTAL | Status: DC | PRN

## 2024-09-09 MED ORDER — ACETAMINOPHEN 325 MG PO TABS: 650.0000 mg | ORAL_TABLET | Freq: Four times a day (QID) | ORAL | Status: DC | PRN

## 2024-09-09 MED ORDER — ONDANSETRON HCL 4 MG/2ML IJ SOLN: 4.0000 mg | Freq: Four times a day (QID) | INTRAMUSCULAR | Status: DC | PRN

## 2024-09-09 MED ORDER — LACTATED RINGERS IV SOLN: INTRAVENOUS | Status: DC

## 2024-09-09 MED ORDER — LACTATED RINGERS IV BOLUS
500.0000 mL | Freq: Once | INTRAVENOUS | Status: AC
  Administered 2024-09-09: 500 mL via INTRAVENOUS

## 2024-09-09 MED ORDER — HEPARIN SODIUM (PORCINE) 5000 UNIT/ML IJ SOLN
5000.0000 [IU] | Freq: Three times a day (TID) | INTRAMUSCULAR | Status: DC
  Administered 2024-09-10 – 2024-09-11 (×3): 5000 [IU] via SUBCUTANEOUS
  Filled 2024-09-09 (×4): qty 1

## 2024-09-09 MED ORDER — ONDANSETRON HCL 4 MG PO TABS: 4.0000 mg | ORAL_TABLET | Freq: Four times a day (QID) | ORAL | Status: DC | PRN

## 2024-09-09 NOTE — ED Provider Notes (Signed)
 " [place] EMERGENCY DEPARTMENT AT [facility] Provider Note   CSN: [id number] Arrival date & time: 09/09/24  1900     Patient presents with: Near Syncope (Pt AAoX2. BIB EMS from [facility] due to syncopal episode. As per EMS pt was in the restroom being cleaned by staff when she went down. Pt had enough episode with EMS as they tried to get her on stretcher. No obvious injuries. )   Brittany Holden is a 89 y.o. female.    Near Syncope  Patient presents for syncope.  Medical history includes dementia, HTN, IBS, neuropathy, CKD.  She was seen in the ED yesterday for generalized weakness.  She did test positive for influenza recently.  She has been refusing Tylenol .  During ED observation yesterday, oxygen remained in the low to mid 90s but would drop while sleeping.  X-ray did not show any acute findings.  Although initial plan was for admission, after patient's pulse oximeter was adjusted, she had normal SpO2 on room air.  She started on Tamiflu  today.  She subsequently developed diarrhea.  She has had poor p.o. intake.  Today, while seated in a shower chair getting bathed, she had a witnessed syncopal episode.  Duration of this is unknown.  She was awake when EMS arrived on scene.  As EMS was transferring her to stretcher, she had a second syncopal episode.  Duration of this episode was approximately 45 seconds.  EMS gave approximately 100 cc IVF prior to arrival.  CBG was in the range of 200.  Patient is unable to provide any history due to dementia.     Prior to Admission medications  Medication Sig Start Date End Date Taking? Authorizing Provider  amLODipine (NORVASC) 5 MG tablet Take 5 mg by mouth daily.    [provider]  calcitRIOL (ROCALTROL) 0.25 MCG capsule Take 0.25 mcg by mouth 2 (two) times a week. 04/08/23   [provider]  cephALEXin  (KEFLEX ) 500 MG capsule Take 1 capsule (500 mg total) by mouth 3 (three) times daily. 05/12/24   Rogelia Jerilynn RAMAN, MD  chlorthalidone (HYGROTON) 25 MG tablet Take 25 mg by mouth every other day. 11/02/20 08/28/24  [provider]  Cholecalciferol 25 MCG (1000 UT) tablet Take 400 Units by mouth daily. Patient not taking: Reported on 08/28/2024    [provider]  divalproex (DEPAKOTE SPRINKLE) 125 MG capsule Take 125 mg by mouth 3 (three) times daily. 05/07/24   [provider]  donepezil  (ARICEPT ) 10 MG tablet Take 10 mg by mouth daily. 08/30/23   [provider]  doxycycline  (VIBRAMYCIN ) 100 MG capsule Take 1 capsule (100 mg total) by mouth 2 (two) times daily. 11/17/23   Haze Lonni PARAS, MD  ferrous sulfate 325 (65 FE) MG tablet Take 325 mg by mouth daily with breakfast.    [provider]  furosemide (LASIX) 40 MG tablet Take 40 mg by mouth daily. 05/07/24   [provider]  hydrochlorothiazide  (MICROZIDE ) 12.5 MG capsule Take 12.5 mg by mouth every morning. Patient not taking: Reported on 08/28/2024 04/08/23   [provider]  HYDROcodone -acetaminophen  (NORCO/VICODIN) 5-325 MG tablet Take 1 tablet by mouth daily as needed. 05/19/24   [provider]  levothyroxine  (SYNTHROID , LEVOTHROID) 75 MCG tablet Take 75 mcg by mouth daily.      [provider]  LORazepam  (ATIVAN ) 0.5 MG tablet Take by mouth. 05/07/24   [provider]  metoprolol  succinate (TOPROL  XL) 25 MG  24 hr tablet Take 1 tablet (25 mg total) by mouth daily. 04/20/20   Okey Vina GAILS, MD  Multiple Minerals-Vitamins (CALCIUM & VIT D3 BONE HEALTH PO) Take 1 tablet by mouth daily. Patient not taking: Reported on 08/28/2024    [provider]  oseltamivir  (TAMIFLU ) 75 MG capsule Take 1 capsule (75 mg total) by mouth every 12 (twelve) hours. 09/08/24   Idol, Julie, PA-C  sertraline (ZOLOFT) 25 MG tablet Take 25 mg by mouth daily. 05/07/24   [provider]  simvastatin  (ZOCOR ) 20 MG tablet Take 20 mg by mouth every morning.     [provider]  sodium zirconium cyclosilicate (LOKELMA) 5 g packet Take 10 g by mouth daily.    [provider]  torsemide (DEMADEX) 5 MG tablet Take 5 mg by mouth daily. 04/09/24   [provider]  traZODone (DESYREL) 50 MG tablet Take 50 mg by mouth at bedtime. 05/07/24   [provider]    Allergies: Patient has no known allergies.    Review of Systems  Unable to perform ROS: Dementia  Cardiovascular:  Positive for near-syncope.  Neurological:  Positive for syncope.    Updated Vital Signs BP 111/68 (BP Location: Right Arm)   Pulse 87   Temp 98.1 F (36.7 C) (Oral)   Resp 20   SpO2 (!) 89%   Physical Exam Vitals and nursing note reviewed.  Constitutional:      General: She is not in acute distress.    Appearance: Normal appearance. She is well-developed. She is not ill-appearing, toxic-appearing or diaphoretic.  HENT:     Head: Normocephalic and atraumatic.     Right Ear: External ear normal.     Left Ear: External ear normal.     Nose: Nose normal.     Mouth/Throat:     Mouth: Mucous membranes are moist.  Eyes:     Extraocular Movements: Extraocular movements intact.     Conjunctiva/sclera: Conjunctivae normal.  Cardiovascular:     Rate and Rhythm: Normal rate and regular rhythm.     Heart sounds: No murmur heard. Pulmonary:     Effort: Pulmonary effort is normal. No respiratory distress.     Comments: Bony protrusion of chest Abdominal:     General: There is no distension.     Palpations: Abdomen is soft.  Musculoskeletal:        General: No swelling. Normal range of motion.     Cervical back: Neck supple.  Skin:    General: Skin is warm and dry.  Neurological:     General: No focal deficit present.     Mental Status: She is alert. She is disoriented.  Psychiatric:        Mood and Affect: Mood normal.        Behavior: Behavior normal.     (all labs ordered are listed, but only abnormal results are displayed) Labs Reviewed   COMPREHENSIVE METABOLIC PANEL WITH GFR - Abnormal; Notable for the following components:      Result Value   Potassium 3.4 (*)    Chloride 96 (*)    CO2 20 (*)    Glucose, Bld 169 (*)    BUN 31 (*)    Creatinine, Ser 1.69 (*)    AST 43 (*)    GFR, Estimated 28 (*)    Anion gap 19 (*)    All other components within normal limits  CBC WITH DIFFERENTIAL/PLATELET - Abnormal; Notable for the following components:  Hemoglobin 15.4 (*)    HCT 47.7 (*)    Platelets 119 (*)    Lymphs Abs 0.6 (*)    All other components within normal limits  PRO BRAIN NATRIURETIC PEPTIDE - Abnormal; Notable for the following components:   Pro Brain Natriuretic Peptide 2,478.0 (*)    All other components within normal limits  TROPONIN T, HIGH SENSITIVITY - Abnormal; Notable for the following components:   Troponin T High Sensitivity 45 (*)    All other components within normal limits  MAGNESIUM  URINALYSIS, ROUTINE W REFLEX MICROSCOPIC  TROPONIN T, HIGH SENSITIVITY    EKG: EKG Interpretation Date/Time:  Wednesday September 09 2024 20:13:43 EST Ventricular Rate:  85 PR Interval:  154 QRS Duration:  94 QT Interval:  395 QTC Calculation: 470 R Axis:   28  Text Interpretation: Sinus rhythm Atrial premature complexes Consider left atrial enlargement Confirmed by Melvenia Motto [phone]) on 09/09/2024 8:27:20 PM  Radiology: CT HEAD WO CONTRAST Result Date: 09/09/2024 EXAM: CT HEAD WITHOUT CONTRAST 09/09/2024 08:04:23 PM TECHNIQUE: CT of the head was performed without the administration of intravenous contrast. Automated exposure control, iterative reconstruction, and/or weight based adjustment of the mA/kV was utilized to reduce the radiation dose to as low as reasonably achievable. COMPARISON: 08/30/2024 CLINICAL HISTORY: Syncope/presyncope, cerebrovascular cause suspected. FINDINGS: BRAIN AND VENTRICLES: No acute hemorrhage. No evidence of acute infarct. No hydrocephalus. No extra-axial collection. No mass effect  or midline shift. Atrophy and chronic small vessel disease throughout the deep white matter. ORBITS: No acute abnormality. SINUSES: Again noted is complete opacification of the left maxillary sinus with expansion of the sinus and extension of soft tissue through the medial wall and into the left nasal cavity and nasopharynx. This is unchanged from prior study. SOFT TISSUES AND SKULL: No acute soft tissue abnormality. No skull fracture. IMPRESSION: 1. No acute intracranial abnormality. 2. Atrophy and chronic small vessel disease throughout the deep white matter. 3. Complete opacification of the left maxillary sinus with expansion of the sinus and extension of soft tissue through the medial wall into the left nasal cavity and nasopharynx, unchanged from prior study. Recommend nonemergent ENT evaluation if not already obtained. Electronically signed by: Franky Crease MD MD 09/09/2024 08:10 PM EST RP Workstation: [id number]   DG Chest Port 1 View Result Date: 09/09/2024 EXAM: 1 VIEW(S) XRAY OF THE CHEST 09/09/2024 08:02:00 PM COMPARISON: 09/08/2024 CLINICAL HISTORY: syncope FINDINGS: LUNGS AND PLEURA: No focal pulmonary opacity. No pleural effusion. No pneumothorax. HEART AND MEDIASTINUM: Aortic atherosclerosis. BONES AND SOFT TISSUES: No acute osseous abnormality. IMPRESSION: 1. No acute cardiopulmonary disease. Electronically signed by: Franky Crease MD MD 09/09/2024 08:05 PM EST RP Workstation: [id number]   DG Chest Portable 1 View Result Date: 09/08/2024 EXAM: 1 VIEW(S) XRAY OF THE CHEST 09/08/2024 06:35:00 PM COMPARISON: 06/23/2024 CLINICAL HISTORY: influenza positive, transient hypoxia FINDINGS: LUNGS AND PLEURA: Chronic coarsened markings. No pulmonary edema. No pleural effusion. No pneumothorax. HEART AND MEDIASTINUM: Atherosclerotic plaque noted. No acute abnormality of the cardiac and mediastinal silhouettes. BONES AND SOFT TISSUES: Osteopenia. IMPRESSION: 1. No acute findings. 2. Chronic coarsened pulmonary  markings without pulmonary edema. Electronically signed by: Elsie Gravely MD 09/08/2024 06:58 PM EST RP Workstation: [id number]     Procedures   Medications Ordered in the ED  lactated ringers  bolus 500 mL (has no administration in time range)  lactated ringers  infusion (has no administration in time range)  Medical Decision Making Amount and/or Complexity of Data Reviewed Labs: ordered. Radiology: ordered.   This patient presents to the ED for concern of syncope, this involves an extensive number of treatment options, and is a complaint that carries with it a high risk of complications and morbidity.  The differential diagnosis includes dehydration, anemia, metabolic derangements, vasovagal episode, arrhythmia   Co morbidities / Chronic conditions that complicate the patient evaluation  dementia, HTN, IBS, neuropathy, CKD   Additional history obtained:  Additional history obtained from EMR External records from outside source obtained and reviewed including EMS, patient's son   Lab Tests:  I Ordered, and personally interpreted labs.  The pertinent results include: Hemoglobin increase from baseline suggestive of hemoconcentration; no leukocytosis, baseline creatinine, slight hypokalemia, AGMA likely secondary to starvation ketosis, mild elevation in troponin and BNP   Imaging Studies ordered:  I ordered imaging studies including chest x-ray, CT head I independently visualized and interpreted imaging which showed no acute findings.  Redemonstration of known left maxillary sinus opacification I agree with the radiologist interpretation   Cardiac Monitoring: / EKG:  The patient was maintained on a cardiac monitor.  I personally viewed and interpreted the cardiac monitored which showed an underlying rhythm of: Sinus rhythm   Problem List / ED Course / Critical interventions / Medication management  Patient presenting after 2  witnessed syncopal episodes this evening.  On arrival, she is awake.  Vital signs on arrival notable for mild hypoxia.  She does not have any focal deficits.  She is disoriented but reportedly at her mental baseline.  Patient was placed on monitor.  Workup was initiated.  CBC shows increased hemoglobin suggestive of hemoconcentration.  IV fluids were ordered.  Patient's son arrived at bedside and was able to provide further history.  He reports that she did start on Tamiflu  today and subsequently developed diarrhea.  She has had poor p.o. intake.  While in the ED, patient had SpO2 in the 80s on room air.  She was placed on 2 L of supplemental oxygen.  Her chest x-ray does not show any acute findings.  Shared decision making discussion was had with patient's son.  Patient's son would prefer her admitted today for observation.  Patient remained in normal sinus rhythm.  She was admitted for further management. I ordered medication including IV fluids for hydration Reevaluation of the patient after these medicines showed that the patient stayed the same I have reviewed the patients home medicines and have made adjustments as needed  Social Determinants of Health:  Has dementia and resides in nursing facility     Final diagnoses:  Influenza  Dehydration  Syncope, unspecified syncope type  Acute respiratory failure with hypoxia [facility])    ED Discharge Orders     None          Melvenia Motto, MD 09/09/24 2138  "

## 2024-09-09 NOTE — ED Notes (Signed)
 Patient has purewick in place. No urine at this time. Fluids initiated

## 2024-09-09 NOTE — H&P (Signed)
 " History and Physical    Patient: Brittany Holden FMW:[id number] DOB: [birth year] DOA: 09/09/2024 DOS: the patient was seen and examined on 09/09/2024 PCP: Shona Norleen PEDLAR, MD  Patient coming from: SNF  Chief Complaint:  Chief Complaint  Patient presents with   Near Syncope    Pt AAoX2. BIB EMS from [facility] due to syncopal episode. As per EMS pt was in the restroom being cleaned by staff when she went down. Pt had enough episode with EMS as they tried to get her on stretcher. No obvious injuries.    HPI: Brittany Holden is a 89 y.o. female with medical history significant of hypertension, dementia, hypothyroidism who presents to the emergency department from [facility] nursing facility via EMS due to a syncopal episode.  She was seen in the ED yesterday and was diagnosed with influenza and started on Tamiflu  today, while in the ED, O2 sat was low to mid 90s which dropped further when she falls asleep, she was initially going to be admitted, but O2 sat seemed to normalize and was discharged back to nursing facility.  Today, while being bathed on a shower chair, she had a witnessed syncopal episode and EMS was activated, but patient was already awake when EMS arrived.  As EMS team was transferring patient to stretcher, she had another syncopal episode which lasted about 45 seconds.  100 cc of IV fluid was provided in the field.  ED course In the emergency department, she was hemodynamically stable except for O2 sat of 89% on room air.  Workup in the ED showed elevated hemoglobin/hematocrit 15.4/47.7, platelets 119.  BMP showed sodium 135, potassium 3.4, chloride 96, bicarb 20, blood glucose 169, BUN 31, creatinine 0.69.  Troponin 45 > 43, proBNP 2,478 CT head without contrast showed no acute intracranial abnormality Chest x-ray showed no acute cardiopulmonary disease IV hydration was provided.  TRH was asked to admit patient   Review of Systems: As mentioned in the history of present  illness. All other systems reviewed and are negative. Past Medical History:  Diagnosis Date   Arrhythmia    Cancer (HCC)    Dementia (HCC)    Diverticulosis of colon (without mention of hemorrhage)    Dysrhythmia    Hypertension    Hypothyroidism    IBS (irritable bowel syndrome)    Neuropathy    Palpitations    Personal history of colonic polyps 1999 & 2007   hyperplastic    Past Surgical History:  Procedure Laterality Date   ABDOMINAL HYSTERECTOMY     FOOT ARTHRODESIS     pipj digits  2-4 left foot   ORIF PATELLA Left 11/06/2021   Procedure: OPEN REDUCTION INTERNAL (ORIF) FIXATION PATELLA;  Surgeon: Onesimo Oneil LABOR, MD;  Location: [facility];  Service: Orthopedics;  Laterality: Left;   TENDON REPAIR Right 05/18/2021   Procedure: RIGHT RING AND RIGHT SMALL TENDON TRANSFER VS EXTENSOR INDICIS PROPRIUS TO RING AND SMALL FINGER TRANSFER; DISTAL RADIOULNAR JOINT DEBRIDEMENT ULNA  DISTAL RESECTION WITH STABILIZATION;  Surgeon: Murrell Drivers, MD;  Location: [facility];  Service: Orthopedics;  Laterality: Right;   TONSILLECTOMY  06/26/2012   Procedure: TONSILLECTOMY;  Surgeon: Ana LELON Moccasin, MD;  Location: [facility];  Service: ENT;  Laterality: Right;   VAGINAL HYSTERECTOMY     Social History:  reports that she has never smoked. She has never used smokeless tobacco. She reports that she does not drink alcohol and does not use drugs.  Allergies[1]  Family  History  Problem Relation Age of Onset   CAD Neg Hx    Breast cancer Neg Hx     Prior to Admission medications  Medication Sig Start Date End Date Taking? Authorizing Provider  amLODipine (NORVASC) 5 MG tablet Take 5 mg by mouth daily.    [provider]  calcitRIOL (ROCALTROL) 0.25 MCG capsule Take 0.25 mcg by mouth 2 (two) times a week. 04/08/23   [provider]  cephALEXin  (KEFLEX ) 500 MG capsule Take 1 capsule (500 mg total) by mouth 3 (three) times daily. 05/12/24   Rogelia Jerilynn RAMAN, MD  chlorthalidone  (HYGROTON) 25 MG tablet Take 25 mg by mouth every other day. 11/02/20 08/28/24  [provider]  Cholecalciferol 25 MCG (1000 UT) tablet Take 400 Units by mouth daily. Patient not taking: Reported on 08/28/2024    [provider]  divalproex (DEPAKOTE SPRINKLE) 125 MG capsule Take 125 mg by mouth 3 (three) times daily. 05/07/24   [provider]  donepezil  (ARICEPT ) 10 MG tablet Take 10 mg by mouth daily. 08/30/23   [provider]  doxycycline  (VIBRAMYCIN ) 100 MG capsule Take 1 capsule (100 mg total) by mouth 2 (two) times daily. 11/17/23   Haze Lonni PARAS, MD  ferrous sulfate 325 (65 FE) MG tablet Take 325 mg by mouth daily with breakfast.    [provider]  furosemide (LASIX) 40 MG tablet Take 40 mg by mouth daily. 05/07/24   [provider]  hydrochlorothiazide  (MICROZIDE ) 12.5 MG capsule Take 12.5 mg by mouth every morning. Patient not taking: Reported on 08/28/2024 04/08/23   [provider]  HYDROcodone -acetaminophen  (NORCO/VICODIN) 5-325 MG tablet Take 1 tablet by mouth daily as needed. 05/19/24   [provider]  levothyroxine  (SYNTHROID , LEVOTHROID) 75 MCG tablet Take 75 mcg by mouth daily.      [provider]  LORazepam  (ATIVAN ) 0.5 MG tablet Take by mouth. 05/07/24   [provider]  metoprolol  succinate (TOPROL  XL) 25 MG 24 hr tablet Take 1 tablet (25 mg total) by mouth daily. 04/20/20   Okey Vina GAILS, MD  Multiple Minerals-Vitamins (CALCIUM & VIT D3 BONE HEALTH PO) Take 1 tablet by mouth daily. Patient not taking: Reported on 08/28/2024    [provider]  oseltamivir  (TAMIFLU ) 75 MG capsule Take 1 capsule (75 mg total) by mouth every 12 (twelve) hours. 09/08/24   Idol, Julie, PA-C  sertraline (ZOLOFT) 25 MG tablet Take 25 mg by mouth daily. 05/07/24   [provider]  simvastatin  (ZOCOR ) 20 MG tablet Take 20 mg by mouth every morning.     [provider]  sodium zirconium  cyclosilicate (LOKELMA) 5 g packet Take 10 g by mouth daily.    [provider]  torsemide (DEMADEX) 5 MG tablet Take 5 mg by mouth daily. 04/09/24   [provider]  traZODone (DESYREL) 50 MG tablet Take 50 mg by mouth at bedtime. 05/07/24   [provider]    Physical Exam: Vitals:   09/09/24 1914 09/09/24 1916 09/09/24 2130  BP:  111/68   Pulse:  87 83  Resp:  20 19  Temp:  98.1 F (36.7 C)   TempSrc:  Oral   SpO2: 90% (!) 89% 96%   General: Elderly female.  Somnolent, though easily arousable, but quickly goes back to sleep. Not in any acute distress.  HEENT: NCAT.  PERRL. Sclerae anicteric.  Dry mucosal membranes.  Noted old bruise on left cheek Neck: Neck supple without lymphadenopathy.  No carotid bruits. No masses palpated.  Cardiovascular: Regular rate with normal S1-S2 sounds. No murmurs, rubs or gallops auscultated. No JVD.  Respiratory: Clear breath sounds.  No accessory muscle use. Abdomen: Soft, nontender, nondistended. Active bowel sounds. No masses or hepatosplenomegaly  Skin:  Dry, warm to touch. Musculoskeletal:  2+ dorsalis pedis and radial pulses. Good ROM.  No contractures  Psychiatric: Mood appropriate to current condition. Neurologic: No focal neurological deficits.  Assessment and Plan: Syncope Continue telemetry and watch for arrhythmias Troponins 45 > 43  EKG personally reviewed showed normal sinus rhythm at rate of 85 bpm with APCs  Echocardiogram done in 10/2019 showed EF of 60 to 65%.  No RWMA.  G1 DD Echocardiogram will be done to rule out significant aortic stenosis or other outflow obstruction, and also to evaluate EF and to rule out segmental/Regional wall motion abnormalities.  Carotid artery Dopplers will be done to rule out hemodynamically significant stenosis  Acute respiratory failure in the setting of influenza Patient was positive for influenza at the nursing facility and was started on Tamiflu  per EDP and ED medical  record Continue Tamiflu  Continue supplemental oxygen to maintain O2 sat > 92% with plan to wean patient off this as tolerated.  Patient does not use oxygen at baseline.  Elevated proBNP proBNP 2,478.  Patient does not appear to be fluid overloaded Chest x-ray showed no acute cardiopulmonary disease Continue total input/output, daily weights  Continue heart healthy diet  Echocardiogram will be done in the morning to rule out CHF  Thrombocytopenia possibly reactive Platelets 119, no sign of bleeding.  Continue to monitor platelet levels  Chronic kidney disease stage 4 Stable. Renally adjust medications, avoid nephrotoxic agents/dehydration/hypotension  Essential hypertension BP meds will be held at this time due to soft BP  Dementia Continue Aricept   Acquired hypothyroidism Continue Synthroid    Advance Care Planning: DNR  Consults: None  Family Communication: None at bedside  Severity of Illness: The appropriate patient status for this patient is OBSERVATION. Observation status is judged to be reasonable and necessary in order to provide the required intensity of service to ensure the patient's safety. The patient's presenting symptoms, physical exam findings, and initial radiographic and laboratory data in the context of their medical condition is felt to place them at decreased risk for further clinical deterioration. Furthermore, it is anticipated that the patient will be medically stable for discharge from the hospital within 2 midnights of admission.   Author: Jlon Betker, DO 09/09/2024 9:54 PM  For on call review [email or web address].      [1] No Known Allergies  "

## 2024-09-10 ENCOUNTER — Encounter (HOSPITAL_COMMUNITY): Payer: Self-pay | Admitting: Internal Medicine

## 2024-09-10 ENCOUNTER — Observation Stay (HOSPITAL_COMMUNITY)

## 2024-09-10 ENCOUNTER — Other Ambulatory Visit (HOSPITAL_COMMUNITY): Payer: Self-pay | Admitting: *Deleted

## 2024-09-10 ENCOUNTER — Observation Stay (HOSPITAL_BASED_OUTPATIENT_CLINIC_OR_DEPARTMENT_OTHER)

## 2024-09-10 DIAGNOSIS — R55 Syncope and collapse: Secondary | ICD-10-CM | POA: Diagnosis not present

## 2024-09-10 DIAGNOSIS — I5031 Acute diastolic (congestive) heart failure: Secondary | ICD-10-CM

## 2024-09-10 LAB — ECHOCARDIOGRAM COMPLETE
AV Mean grad: 44.7 mmHg
AV Peak grad: 83.2 mmHg
Ao pk vel: 4.56 m/s
Area-P 1/2: 1.74 cm2
Height: 73 in
S' Lateral: 2.6 cm
Weight: 2014.12 [oz_av]

## 2024-09-10 LAB — COMPREHENSIVE METABOLIC PANEL WITH GFR
ALT: 12 U/L (ref 0–44)
AST: 32 U/L (ref 15–41)
Albumin: 3.7 g/dL (ref 3.5–5.0)
Alkaline Phosphatase: 55 U/L (ref 38–126)
Anion gap: 5 (ref 5–15)
BUN: 33 mg/dL — ABNORMAL HIGH (ref 8–23)
CO2: 33 mmol/L — ABNORMAL HIGH (ref 22–32)
Calcium: 8.6 mg/dL — ABNORMAL LOW (ref 8.9–10.3)
Chloride: 99 mmol/L (ref 98–111)
Creatinine, Ser: 1.43 mg/dL — ABNORMAL HIGH (ref 0.44–1.00)
GFR, Estimated: 34 mL/min — ABNORMAL LOW
Glucose, Bld: 84 mg/dL (ref 70–99)
Potassium: 3.9 mmol/L (ref 3.5–5.1)
Sodium: 137 mmol/L (ref 135–145)
Total Bilirubin: 0.3 mg/dL (ref 0.0–1.2)
Total Protein: 5.6 g/dL — ABNORMAL LOW (ref 6.5–8.1)

## 2024-09-10 LAB — CBC
HCT: 34.2 % — ABNORMAL LOW (ref 36.0–46.0)
Hemoglobin: 11.1 g/dL — ABNORMAL LOW (ref 12.0–15.0)
MCH: 30.7 pg (ref 26.0–34.0)
MCHC: 32.5 g/dL (ref 30.0–36.0)
MCV: 94.7 fL (ref 80.0–100.0)
Platelets: 163 K/uL (ref 150–400)
RBC: 3.61 MIL/uL — ABNORMAL LOW (ref 3.87–5.11)
RDW: 12.5 % (ref 11.5–15.5)
WBC: 4.5 K/uL (ref 4.0–10.5)
nRBC: 0 % (ref 0.0–0.2)

## 2024-09-10 LAB — PHOSPHORUS: Phosphorus: 4.1 mg/dL (ref 2.5–4.6)

## 2024-09-10 LAB — MAGNESIUM: Magnesium: 2.1 mg/dL (ref 1.7–2.4)

## 2024-09-10 MED ORDER — DONEPEZIL HCL 5 MG PO TABS
10.0000 mg | ORAL_TABLET | Freq: Every day | ORAL | Status: DC
  Administered 2024-09-10 – 2024-09-11 (×2): 10 mg via ORAL
  Filled 2024-09-10 (×2): qty 2

## 2024-09-10 MED ORDER — SIMVASTATIN 20 MG PO TABS
20.0000 mg | ORAL_TABLET | Freq: Every morning | ORAL | Status: DC
  Administered 2024-09-10 – 2024-09-11 (×2): 20 mg via ORAL
  Filled 2024-09-10 (×2): qty 1

## 2024-09-10 MED ORDER — OSELTAMIVIR PHOSPHATE 30 MG PO CAPS
30.0000 mg | ORAL_CAPSULE | Freq: Every day | ORAL | Status: DC
  Administered 2024-09-10: 30 mg via ORAL
  Filled 2024-09-10: qty 1

## 2024-09-10 MED ORDER — LEVOTHYROXINE SODIUM 75 MCG PO TABS
75.0000 ug | ORAL_TABLET | Freq: Every day | ORAL | Status: DC
  Administered 2024-09-10 – 2024-09-11 (×2): 75 ug via ORAL
  Filled 2024-09-10 (×2): qty 1

## 2024-09-10 NOTE — Progress Notes (Signed)
 Mobility Specialist Progress Note:    09/10/24 1135  Mobility  Activity Ambulated with assistance  Level of Assistance Minimal assist, patient does 75% or more  Assistive Device Front wheel walker  Distance Ambulated (ft) 140 ft  Range of Motion/Exercises Active;All extremities  Activity Response Tolerated well  Mobility Referral Yes  Mobility visit 1 Mobility  Mobility Specialist Start Time (ACUTE ONLY) 1135  Mobility Specialist Stop Time (ACUTE ONLY) 1155  Mobility Specialist Time Calculation (min) (ACUTE ONLY) 20 min   Pt received in bed, agreeable to mobility. Required MinA to stand and CGA to ambulate with RW. Tolerated well, confused. Returned supine, alarm on. All needs met.  Vivia Rosenburg Mobility Specialist Please contact via Special Educational Needs Teacher or  Rehab office at [phone]

## 2024-09-10 NOTE — Progress Notes (Signed)
*  PRELIMINARY RESULTS* Echocardiogram 2D Echocardiogram has been performed.  Brittany Holden 09/10/2024, 4:37 PM

## 2024-09-10 NOTE — Plan of Care (Signed)

## 2024-09-10 NOTE — Progress Notes (Signed)
 " PROGRESS NOTE  Brittany Holden  FMW:[id number] DOB: 03-Apr-1930 DOA: 09/09/2024 PCP: Shona Norleen PEDLAR, MD   Brief Narrative:  89 y.o. female with medical history significant of hypertension, dementia, hypothyroidism who presents to the emergency department from [facility] nursing facility via EMS due to a syncopal episode.  She was seen in the ED yesterday and was diagnosed with influenza and started on Tamiflu  today, while in the ED, O2 sat was low to mid 90s which dropped further when she falls asleep, she was initially going to be admitted, but O2 sat seemed to normalize and was discharged back to nursing facility.  Also with notable diarrhea after starting her on the Tamiflu .  On day of admission, while being bathed on a shower chair, she had a witnessed syncopal episode and EMS was activated, but patient was already awake when EMS arrived.  As EMS team was transferring patient to stretcher, she had another syncopal episode which lasted about 45 seconds.  100 cc of IV fluid was provided in the field.  Admitted to hospital for the same.   Assessment & Plan: Syncope Initial troponins 45, downtrending to 43. - Telemetry has been normal thus far. - EKG with some APCs noted otherwise normal. - Echocardiogram done in 10/2019 showed EF of 60 to 65%.  No RWMA.  G1 DD -Repeat echocardiogram still pending.  Carotid Dopplers ordered in ER and also pending at this time. -Likely secondary to fluid losses from diarrhea and her flu.   Acute respiratory failure in the setting of influenza -Resolving, patient off oxygen on my examination. - Started on Tamiflu  and we will hold this for now as evidently it started her with pretty severe diarrhea outpatient. - As she is awake, alert, at her baseline mental status per her son and the fact that she no longer requires oxygen and I think we can continue to observe her off of Tamiflu  and restart if she worsens.     Elevated proBNP proBNP 2,478.  Patient does not appear to  be fluid overloaded Chest x-ray showed no acute cardiopulmonary disease Continue total input/output, daily weights  Continue heart healthy diet      Echocardiogram will be done in the morning to rule out CHF She was on IVF, stopped these to prevent  fluid overload.  Will restart her home lasix.     Thrombocytopenia possibly reactive Platelets 119, no sign of bleeding.  Continue to monitor platelet levels -Now resolved.   Chronic kidney disease stage 4 Stable. Renally adjust medications, avoid nephrotoxic agents/dehydration/hypotension -Creatinine downtrending.   Essential hypertension BP meds will be held at this time due to low blood pressures. - Restarted blood pressure rises.   Dementia Continue Aricept    Acquired hypothyroidism Continue Synthroid    DVT prophylaxis:  heparin  injection 5,000 Units Start: 09/10/24 0600 SCDs Start: 09/09/24 2314  Code Status:   Code Status: Limited: Do not attempt resuscitation (DNR) -DNR-LIMITED -Do Not Intubate/DNI  Family Communication: Son at bedside, all questions answered. Level of care: Telemetry Status is: Observation Dispo: Likely will discharge home on 1/9 if she continues to do well off of oxygen.  Subjective: Patient awake and sitting up in bed.  Son at bedside.  She has no complaints for self.  Son reports she looks much better today than yesterday prior to admission  Objective: Vitals:   09/10/24 0005 09/10/24 0330 09/10/24 0526 09/10/24 0547  BP: 125/62 (!) 140/71    Pulse: 98 77    Resp: 16 16  Temp: 98.2 F (36.8 C) 97.6 F (36.4 C)    TempSrc: Axillary Axillary    SpO2: 94% (!) 89% 95%   Weight: 56.2 kg   57.1 kg  Height: 6' 1 (1.854 m)       Intake/Output Summary (Last 24 hours) at 09/10/2024 1351 Last data filed at 09/10/2024 0531 Gross per 24 hour  Intake 1338.42 ml  Output --  Net 1338.42 ml   Filed Weights   09/10/24 0005 09/10/24 0547  Weight: 56.2 kg 57.1 kg   Body mass index is 16.61  kg/m.  Gen: 89 y.o. female in no apparent distress.  Nontoxic Pulm: Non-labored breathing.  Some rhonchi bilateral bases but otherwise clear. CV: Regular rate and rhythm. No murmur, rub, or gallop. No JVD GI: Abdomen soft, non-tender, non-distended, with normoactive bowel sounds. No organomegaly or masses felt. Ext: Warm, no deformities, trace bilateral pedal edema Skin: No rashes, lesions  Neuro: Alert and oriented only to person.. No focal neurological deficits. Psych: Calm     I have personally reviewed the following labs and images: CBC: Recent Labs  Lab 09/08/24 2248 09/09/24 2018 09/10/24 0514  WBC 7.0 4.8 4.5  NEUTROABS  --  3.5  --   HGB 12.2 15.4* 11.1*  HCT 38.2 47.7* 34.2*  MCV 94.3 93.5 94.7  PLT 200 119* 163   BMP &GFR Recent Labs  Lab 09/08/24 2248 09/09/24 2018 09/10/24 0514  NA 137 135 137  K 4.2 3.4* 3.9  CL 97* 96* 99  CO2 32 20* 33*  GLUCOSE 114* 169* 84  BUN 24* 31* 33*  CREATININE 1.48* 1.69* 1.43*  CALCIUM 9.2 9.0 8.6*  MG  --  2.2 2.1  PHOS  --   --  4.1   Estimated Creatinine Clearance: 22.6 mL/min (A) (by C-G formula based on SCr of 1.43 mg/dL (H)). Liver & Pancreas: Recent Labs  Lab 09/09/24 2018 09/10/24 0514  AST 43* 32  ALT 14 12  ALKPHOS 64 55  BILITOT 0.4 0.3  PROT 6.6 5.6*  ALBUMIN 4.0 3.7   No results for input(s): LIPASE, AMYLASE in the last 168 hours. No results for input(s): AMMONIA in the last 168 hours. Diabetic: No results for input(s): HGBA1C in the last 72 hours. No results for input(s): GLUCAP in the last 168 hours. Cardiac Enzymes: No results for input(s): CKTOTAL, CKMB, CKMBINDEX, TROPONINI in the last 168 hours. Recent Labs    09/09/24 2018  PROBNP 2,478.0*   Coagulation Profile: No results for input(s): INR, PROTIME in the last 168 hours. Thyroid  Function Tests: No results for input(s): TSH, T4TOTAL, FREET4, T3FREE, THYROIDAB in the last 72 hours. Lipid Profile: No  results for input(s): CHOL, HDL, LDLCALC, TRIG, CHOLHDL, LDLDIRECT in the last 72 hours. Anemia Panel: No results for input(s): VITAMINB12, FOLATE, FERRITIN, TIBC, IRON, RETICCTPCT in the last 72 hours. Urine analysis:    Component Value Date/Time   COLORURINE YELLOW 05/11/2024 2204   APPEARANCEUR HAZY (A) 05/11/2024 2204   LABSPEC 1.011 05/11/2024 2204   PHURINE 6.0 05/11/2024 2204   GLUCOSEU NEGATIVE 05/11/2024 2204   HGBUR SMALL (A) 05/11/2024 2204   BILIRUBINUR NEGATIVE 05/11/2024 2204   KETONESUR NEGATIVE 05/11/2024 2204   PROTEINUR NEGATIVE 05/11/2024 2204   NITRITE NEGATIVE 05/11/2024 2204   LEUKOCYTESUR SMALL (A) 05/11/2024 2204   Sepsis Labs: Invalid input(s): PROCALCITONIN, LACTICIDVEN  Microbiology: Recent Results (from the past 240 hours)  Resp panel by RT-PCR (RSV, Flu A&B, Covid) Anterior Nasal Swab     Status:  Abnormal   Collection Time: 09/09/24 10:55 PM   Specimen: Anterior Nasal Swab  Result Value Ref Range Status   SARS Coronavirus 2 by RT PCR NEGATIVE NEGATIVE Final    Comment: (NOTE) SARS-CoV-2 target nucleic acids are NOT DETECTED.  The SARS-CoV-2 RNA is generally detectable in upper respiratory specimens during the acute phase of infection. The lowest concentration of SARS-CoV-2 viral copies this assay can detect is 138 copies/mL. A negative result does not preclude SARS-Cov-2 infection and should not be used as the sole basis for treatment or other patient management decisions. A negative result may occur with  improper specimen collection/handling, submission of specimen other than nasopharyngeal swab, presence of viral mutation(s) within the areas targeted by this assay, and inadequate number of viral copies(<138 copies/mL). A negative result must be combined with clinical observations, patient history, and epidemiological information. The expected result is Negative.  Fact Sheet for Patients:   bloggercourse.com  Fact Sheet for Healthcare Providers:  seriousbroker.it  This test is no t yet approved or cleared by the [place]  FDA and  has been authorized for detection and/or diagnosis of SARS-CoV-2 by FDA under an Emergency Use Authorization (EUA). This EUA will remain  in effect (meaning this test can be used) for the duration of the COVID-19 declaration under Section 564(b)(1) of the Act, 21 U.S.C.section 360bbb-3(b)(1), unless the authorization is terminated  or revoked sooner.       Influenza A by PCR POSITIVE (A) NEGATIVE Final   Influenza B by PCR NEGATIVE NEGATIVE Final    Comment: (NOTE) The Xpert Xpress SARS-CoV-2/FLU/RSV plus assay is intended as an aid in the diagnosis of influenza from Nasopharyngeal swab specimens and should not be used as a sole basis for treatment. Nasal washings and aspirates are unacceptable for Xpert Xpress SARS-CoV-2/FLU/RSV testing.  Fact Sheet for Patients: bloggercourse.com  Fact Sheet for Healthcare Providers: seriousbroker.it  This test is not yet approved or cleared by the [place]  FDA and has been authorized for detection and/or diagnosis of SARS-CoV-2 by FDA under an Emergency Use Authorization (EUA). This EUA will remain in effect (meaning this test can be used) for the duration of the COVID-19 declaration under Section 564(b)(1) of the Act, 21 U.S.C. section 360bbb-3(b)(1), unless the authorization is terminated or revoked.     Resp Syncytial Virus by PCR NEGATIVE NEGATIVE Final    Comment: (NOTE) Fact Sheet for Patients: bloggercourse.com  Fact Sheet for Healthcare Providers: seriousbroker.it  This test is not yet approved or cleared by the [place]  FDA and has been authorized for detection and/or diagnosis of SARS-CoV-2 by FDA under an Emergency Use  Authorization (EUA). This EUA will remain in effect (meaning this test can be used) for the duration of the COVID-19 declaration under Section 564(b)(1) of the Act, 21 U.S.C. section 360bbb-3(b)(1), unless the authorization is terminated or revoked.  Performed at [facility], [address], [place], [place] [address]     Radiology Studies: US  Carotid Bilateral Result Date: 09/10/2024 CLINICAL DATA:  Syncope. EXAM: BILATERAL CAROTID DUPLEX ULTRASOUND TECHNIQUE: Elnor scale imaging, color Doppler and duplex ultrasound were performed of bilateral carotid and vertebral arteries in the neck. COMPARISON:  None Available. FINDINGS: Criteria: Quantification of carotid stenosis is based on velocity parameters that correlate the residual internal carotid diameter with NASCET-based stenosis levels, using the diameter of the distal internal carotid lumen as the denominator for stenosis measurement. The following velocity measurements were obtained: RIGHT ICA: 75/30 cm/sec CCA: 64/17 cm/sec SYSTOLIC ICA/CCA RATIO:  1.2 ECA: 70 cm/sec LEFT ICA: 55/16 cm/sec CCA: 56/12 cm/sec SYSTOLIC ICA/CCA RATIO:  1.0 ECA: 64 cm/sec RIGHT CAROTID ARTERY: Small amount of plaque at the right carotid bulb. External carotid artery is patent with normal waveform. Normal waveforms and velocities in the internal carotid artery. RIGHT VERTEBRAL ARTERY: Antegrade flow and normal waveform in the right vertebral artery. LEFT CAROTID ARTERY: Small amount of echogenic plaque at the left carotid bulb. External carotid artery is patent with normal waveform. Normal waveforms and velocities in the internal carotid artery. LEFT VERTEBRAL ARTERY: Antegrade flow and normal waveform in the left vertebral artery. IMPRESSION: 1. Small amount of plaque at the carotid bulbs. Estimated degree of stenosis in the internal carotid arteries is less than 50% bilaterally. 2. Patent vertebral arteries with antegrade flow. Electronically Signed   By: Juliene Balder M.D.    On: 09/10/2024 09:42   CT HEAD WO CONTRAST Result Date: 09/09/2024 EXAM: CT HEAD WITHOUT CONTRAST 09/09/2024 08:04:23 PM TECHNIQUE: CT of the head was performed without the administration of intravenous contrast. Automated exposure control, iterative reconstruction, and/or weight based adjustment of the mA/kV was utilized to reduce the radiation dose to as low as reasonably achievable. COMPARISON: 08/30/2024 CLINICAL HISTORY: Syncope/presyncope, cerebrovascular cause suspected. FINDINGS: BRAIN AND VENTRICLES: No acute hemorrhage. No evidence of acute infarct. No hydrocephalus. No extra-axial collection. No mass effect or midline shift. Atrophy and chronic small vessel disease throughout the deep white matter. ORBITS: No acute abnormality. SINUSES: Again noted is complete opacification of the left maxillary sinus with expansion of the sinus and extension of soft tissue through the medial wall and into the left nasal cavity and nasopharynx. This is unchanged from prior study. SOFT TISSUES AND SKULL: No acute soft tissue abnormality. No skull fracture. IMPRESSION: 1. No acute intracranial abnormality. 2. Atrophy and chronic small vessel disease throughout the deep white matter. 3. Complete opacification of the left maxillary sinus with expansion of the sinus and extension of soft tissue through the medial wall into the left nasal cavity and nasopharynx, unchanged from prior study. Recommend nonemergent ENT evaluation if not already obtained. Electronically signed by: Franky Crease MD MD 09/09/2024 08:10 PM EST RP Workstation: [id number]   DG Chest Port 1 View Result Date: 09/09/2024 EXAM: 1 VIEW(S) XRAY OF THE CHEST 09/09/2024 08:02:00 PM COMPARISON: 09/08/2024 CLINICAL HISTORY: syncope FINDINGS: LUNGS AND PLEURA: No focal pulmonary opacity. No pleural effusion. No pneumothorax. HEART AND MEDIASTINUM: Aortic atherosclerosis. BONES AND SOFT TISSUES: No acute osseous abnormality. IMPRESSION: 1. No acute  cardiopulmonary disease. Electronically signed by: Franky Crease MD MD 09/09/2024 08:05 PM EST RP Workstation: [id number]    Scheduled Meds:  donepezil   10 mg Oral Daily   heparin   5,000 Units Subcutaneous Q8H   levothyroxine   75 mcg Oral Q0600   oseltamivir   30 mg Oral Daily   simvastatin   20 mg Oral q morning   Continuous Infusions:   LOS: 0 days   35 minutes with more than 50% spent in reviewing records, counseling patient/family and coordinating care.  Reyes VEAR Gaw, MD [facility] [email or web address] 09/10/2024, 1:51 PM    "

## 2024-09-10 NOTE — TOC Initial Note (Signed)
 Transition of Care [facility]) - Initial/Assessment Note    Patient Details  Name: Brittany Holden MRN: [id number] Date of Birth: 05/23/1930  [facility] CM/SW Contact:    Noreen KATHEE Pinal, LCSWA Phone Number: 09/10/2024, 11:55 AM  Clinical Narrative:                  CSW spoke with patient son Sharolyn to complete assessment. Sharolyn shared that patient has been at [facility] since February of 2025 in the Memory Care Unit and the staff assist with patient ADL needs. He reports that patient was using a walking prior to getting sick and plans are for her to return back once medically stable. ICM will continue to follow.   Expected Discharge Plan: Memory Care Barriers to Discharge: Continued Medical Work up   Patient Goals and CMS Choice Patient states their goals for this hospitalization and ongoing recovery are:: Return back to [facility]   Choice offered to / list presented to : Adult Children      Expected Discharge Plan and Services     Post Acute Care Choice: Durable Medical Equipment [facility]) Living arrangements for the past 2 months:  (Memory Care)                                      Prior Living Arrangements/Services Living arrangements for the past 2 months:  (Memory Care) Lives with:: Facility Resident Patient language and need for interpreter reviewed:: Yes Do you feel safe going back to the place where you live?: Yes      Need for Family Participation in Patient Care: Yes (Comment) Care giver support system in place?: Yes (comment) Current home services: DME Criminal Activity/Legal Involvement Pertinent to Current Situation/Hospitalization: No - Comment as needed  Activities of Daily Living   ADL Screening (condition at time of admission) Independently performs ADLs?: No Does the patient have a NEW difficulty with bathing/dressing/toileting/self-feeding that is expected to last >3 days?: No Does the patient have a NEW difficulty with  getting in/out of bed, walking, or climbing stairs that is expected to last >3 days?: No Does the patient have a NEW difficulty with communication that is expected to last >3 days?: No Is the patient deaf or have difficulty hearing?: No Does the patient have difficulty seeing, even when wearing glasses/contacts?: No Does the patient have difficulty concentrating, remembering, or making decisions?: Yes  Permission Sought/Granted      Share Information with NAME: Sharolyn     Permission granted to share info w Relationship: Son     Emotional Assessment Appearance:: Appears stated age Attitude/Demeanor/Rapport: Unable to Assess Affect (typically observed): Unable to Assess   Alcohol / Substance Use: Not Applicable Psych Involvement: No (comment)  Admission diagnosis:  Dehydration [E86.0] Syncope [R55] Acute respiratory failure with hypoxia (HCC) [J96.01] Influenza [J11.1] Syncope, unspecified syncope type [R55] Patient Active Problem List   Diagnosis Date Noted   Syncope 09/09/2024   Influenza A 09/08/2024   Closed fracture of right olecranon process 05/20/2024   Acute alteration in mental status 10/01/2023   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 [place] [address] Phone: [phone] Fax: [phone]  [facility] - [place], [place] - [address] [place] [address] Phone: [phone] Fax: [phone]  [facility] [address] [place] [address] Phone: [phone] Fax: [phone]     Social  Drivers of Health (SDOH) Social History: SDOH Screenings   Food Insecurity: Patient Unable To Answer (09/10/2024)  Housing: Unknown (09/10/2024)  Transportation Needs: Patient Unable To Answer (09/10/2024)  Utilities: Patient Unable To Answer (09/10/2024)  Social Connections: Patient Unable To Answer (09/10/2024)  Tobacco Use: Low Risk (09/10/2024)   SDOH Interventions:     Readmission Risk Interventions     No data to display

## 2024-09-11 DIAGNOSIS — R55 Syncope and collapse: Secondary | ICD-10-CM | POA: Diagnosis not present

## 2024-09-11 LAB — CBC
HCT: 34.4 % — ABNORMAL LOW (ref 36.0–46.0)
Hemoglobin: 11.2 g/dL — ABNORMAL LOW (ref 12.0–15.0)
MCH: 30.5 pg (ref 26.0–34.0)
MCHC: 32.6 g/dL (ref 30.0–36.0)
MCV: 93.7 fL (ref 80.0–100.0)
Platelets: 179 K/uL (ref 150–400)
RBC: 3.67 MIL/uL — ABNORMAL LOW (ref 3.87–5.11)
RDW: 12.3 % (ref 11.5–15.5)
WBC: 6.2 K/uL (ref 4.0–10.5)
nRBC: 0 % (ref 0.0–0.2)

## 2024-09-11 LAB — BASIC METABOLIC PANEL WITH GFR
Anion gap: 5 (ref 5–15)
BUN: 26 mg/dL — ABNORMAL HIGH (ref 8–23)
CO2: 33 mmol/L — ABNORMAL HIGH (ref 22–32)
Calcium: 9 mg/dL (ref 8.9–10.3)
Chloride: 100 mmol/L (ref 98–111)
Creatinine, Ser: 1.18 mg/dL — ABNORMAL HIGH (ref 0.44–1.00)
GFR, Estimated: 43 mL/min — ABNORMAL LOW
Glucose, Bld: 82 mg/dL (ref 70–99)
Potassium: 4.2 mmol/L (ref 3.5–5.1)
Sodium: 138 mmol/L (ref 135–145)

## 2024-09-11 NOTE — Care Management Obs Status (Signed)
 MEDICARE OBSERVATION STATUS NOTIFICATION   Patient Details  Name: Brittany Holden MRN: [id number] Date of Birth: [birth year]   Medicare Observation Status Notification Given:  Yes    [name] 09/11/2024, 9:50 AM

## 2024-09-11 NOTE — TOC Transition Note (Signed)
 Transition of Care [facility]) - Discharge Note   Patient Details  Name: Brittany Holden MRN: [id number] Date of Birth: 28-Dec-1929  Transition of Care [facility]) CM/SW Contact:  Noreen KATHEE Pinal, LCSWA Phone Number: 09/11/2024, 10:26 AM   Clinical Narrative:     CSW spoke with Jody at [facility] to discuss patient readiness for DC today. Jodi asked about patient mobility and CSW updated her that patient walked 140 ft yesterday with the mobility specialist and is off oxygen. Patient was min assist, contact guard. Jody then stated that her or Tracie will come out to assess patient, go back to the office to discuss, and will follow back up with CSW on decision. CSW called son and updated him on DC readiness and transportation ,son stated that he will provide the transportation. CSW informed son that he will receive a call from myself about patient being ready for pick up after assessment and paperwork has bee approved from facility.   Final next level of care: Memory Care Barriers to Discharge: Barriers Resolved   Patient Goals and CMS Choice Patient states their goals for this hospitalization and ongoing recovery are:: return back to [place] CMS Medicare.gov Compare Post Acute Care list provided to:: Patient Represenative (must comment) (Son- Roger) Choice offered to / list presented to : Adult Children      Discharge Placement                Patient to be transferred to facility by: Son- Sharolyn Name of family member notified: Sharolyn- son Patient and family notified of of transfer: 09/11/24  Discharge Plan and Services Additional resources added to the After Visit Summary for       Post Acute Care Choice: Durable Medical Equipment [facility])                               Social Drivers of Health (SDOH) Interventions SDOH Screenings   Food Insecurity: Patient Unable To Answer (09/10/2024)  Housing: Unknown (09/10/2024)  Transportation Needs: Patient Unable To  Answer (09/10/2024)  Utilities: Patient Unable To Answer (09/10/2024)  Social Connections: Patient Unable To Answer (09/10/2024)  Tobacco Use: Low Risk (09/10/2024)     Readmission Risk Interventions     No data to display

## 2024-09-11 NOTE — [place] FL2 (Signed)
 " [place]  MEDICAID FL2 LEVEL OF CARE FORM     IDENTIFICATION  Patient Name: Brittany Holden Birthdate: 11-11-1929 Sex: female Admission Date (Current Location): 09/09/2024  [facility] and [place] Number:  [employer] and Address:  [facility],  [address], [place] [address]      Provider Number: [id number]  Attending Physician Name and Address:  Elpidio Reyes DEL, MD  Relative Name and Phone Number:  Sharolyn Chancy- Son [phone]    Current Level of Care: Hospital Recommended Level of Care: Memory Care Prior Approval Number:    Date Approved/Denied:   PASRR Number:    Discharge Plan: Other (Comment) (Memory Care- [place])    Current Diagnoses: Patient Active Problem List   Diagnosis Date Noted   Syncope 09/09/2024   Influenza A 09/08/2024   Closed fracture of right olecranon process 05/20/2024   Acute alteration in mental status 10/01/2023   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) 10/01/2023   Chest pain with high risk for cardiac etiology 11/11/2014   Chest pain 11/11/2014   CKD (chronic kidney disease) stage 3, GFR 30-59 ml/min (HCC) 11/11/2014   Essential hypertension 11/11/2014   Hyperlipidemia 11/11/2014    Orientation RESPIRATION BLADDER Height & Weight     Self  Normal Continent Weight: 125 lb 14.1 oz (57.1 kg) Height:  6' 1 (185.4 cm)  BEHAVIORAL SYMPTOMS/MOOD NEUROLOGICAL BOWEL NUTRITION STATUS      Continent Diet (See DC summary- Heart)  AMBULATORY STATUS COMMUNICATION OF NEEDS Skin   Supervision Verbally Normal                       Personal Care Assistance Level of Assistance  Bathing, Feeding, Dressing Bathing Assistance: Limited assistance Feeding assistance: Limited assistance Dressing Assistance: Limited assistance     Functional Limitations Info  Sight, Hearing, Speech Sight Info: Adequate Hearing Info: Adequate Speech Info: Adequate    SPECIAL CARE FACTORS FREQUENCY                        Contractures Contractures Info: Not present    Additional Factors Info  Code Status, Allergies Code Status Info: DNR-Limited Allergies Info: NKA           Current Medications (09/11/2024):  This is the current hospital active medication list Current Facility-Administered Medications  Medication Dose Route Frequency Provider Last Rate Last Admin   acetaminophen  (TYLENOL ) tablet 650 mg  650 mg Oral Q6H PRN Adefeso, Oladapo, DO       Or   acetaminophen  (TYLENOL ) suppository 650 mg  650 mg Rectal Q6H PRN Adefeso, Oladapo, DO       donepezil  (ARICEPT ) tablet 10 mg  10 mg Oral Daily Adefeso, Oladapo, DO   10 mg at 09/11/24 0917   heparin  injection 5,000 Units  5,000 Units Subcutaneous Q8H Adefeso, Oladapo, DO   5,000 Units at 09/11/24 0523   levothyroxine  (SYNTHROID ) tablet 75 mcg  75 mcg Oral Q0600 Adefeso, Oladapo, DO   75 mcg at 09/11/24 9476   ondansetron  (ZOFRAN ) tablet 4 mg  4 mg Oral Q6H PRN Adefeso, Oladapo, DO       Or   ondansetron  (ZOFRAN ) injection 4 mg  4 mg Intravenous Q6H PRN Adefeso, Oladapo, DO       simvastatin  (ZOCOR ) tablet 20 mg  20 mg Oral q morning Adefeso, Oladapo, DO   20 mg at 09/11/24 9081     Discharge Medications: Please see discharge  summary for a list of discharge medications. Allergies as of 09/11/2024   No Known Allergies      Medication List     STOP taking these medications    calcium carbonate 1500 (600 Ca) MG Tabs tablet Commonly known as: OSCAL   cephALEXin  500 MG capsule Commonly known as: KEFLEX    chlorthalidone 25 MG tablet Commonly known as: HYGROTON   doxycycline  100 MG capsule Commonly known as: VIBRAMYCIN    oseltamivir  75 MG capsule Commonly known as: TAMIFLU    torsemide 5 MG tablet Commonly known as: DEMADEX       TAKE these medications    amLODipine 5 MG tablet Commonly known as: NORVASC Take 5 mg by mouth daily.   calcitRIOL 0.25 MCG capsule Commonly known as: ROCALTROL Take 0.25 mcg by mouth 2  (two) times a week.   CALCIUM & VIT D3 BONE HEALTH PO Take 1 tablet by mouth daily.   Cholecalciferol 25 MCG (1000 UT) tablet Take 400 Units by mouth daily. What changed: Another medication with the same name was removed. Continue taking this medication, and follow the directions you see here.   cyanocobalamin  250 MCG tablet Commonly known as: VITAMIN B12 Take 250 mcg by mouth daily.   divalproex 125 MG capsule Commonly known as: DEPAKOTE SPRINKLE Take 125 mg by mouth 2 (two) times daily. What changed: Another medication with the same name was removed. Continue taking this medication, and follow the directions you see here.   donepezil  10 MG tablet Commonly known as: ARICEPT  Take 10 mg by mouth daily.   ferrous sulfate 325 (65 FE) MG tablet Take 325 mg by mouth daily with breakfast. What changed: Another medication with the same name was removed. Continue taking this medication, and follow the directions you see here.   furosemide 40 MG tablet Commonly known as: LASIX Take 40 mg by mouth daily. What changed: Another medication with the same name was removed. Continue taking this medication, and follow the directions you see here.   guaifenesin  100 MG/5ML syrup Commonly known as: ROBITUSSIN Take 200 mg by mouth 3 (three) times daily.   hydrochlorothiazide  12.5 MG capsule Commonly known as: MICROZIDE  Take 12.5 mg by mouth every morning.   HYDROcodone -acetaminophen  5-325 MG tablet Commonly known as: NORCO/VICODIN Take 1 tablet by mouth daily as needed.   levothyroxine  75 MCG tablet Commonly known as: SYNTHROID  Take 75 mcg by mouth daily.   Lokelma 5 g packet Generic drug: sodium zirconium cyclosilicate Take 10 g by mouth daily.   LORazepam  0.5 MG tablet Commonly known as: ATIVAN  Take 0.5 mg by mouth 2 (two) times daily. What changed: Another medication with the same name was removed. Continue taking this medication, and follow the directions you see here.    metoprolol  succinate 25 MG 24 hr tablet Commonly known as: Toprol  XL Take 1 tablet (25 mg total) by mouth daily.   sertraline 25 MG tablet Commonly known as: ZOLOFT Take 25 mg by mouth daily.   simvastatin  20 MG tablet Commonly known as: ZOCOR  Take 20 mg by mouth every morning.   traZODone 50 MG tablet Commonly known as: DESYREL Take 50 mg by mouth at bedtime.         Relevant Imaging Results:  Relevant Lab Results:   Additional Information SSN : [id number]  Noreen KATHEE [place], LCSWA     "

## 2024-09-11 NOTE — Plan of Care (Signed)

## 2024-09-11 NOTE — Discharge Summary (Signed)
 " Physician Discharge Summary   Patient: Brittany Holden MRN: [id number] DOB: 04/08/1930  Admit date:     09/09/2024  Discharge date: 09/11/2024  Discharge Physician: Reyes VEAR Gaw   PCP: Shona Norleen PEDLAR, MD   Recommendations at discharge:    Patient had numerous duplications listed on her home med sheet, these were stopped at discharge.  She was listed as taking both furosemide plus low dose torsemide, torsemide stopped at DC.  Patient listed as being on Depakote, benzo, trazodone, and SRRI.  Please re-eval whether all these medications are needed.   Tamiflu  stopped while in house due to diarrhea as adverse side effect.  Re-evaluate to see if this needs to be restarted outpatient.    Discharge Diagnoses: Principal Problem:   Syncope  Resolved Problems:   * No resolved hospital problems. *  Hospital Course: 89 y.o. female with medical history significant of hypertension, dementia, hypothyroidism who presents to the emergency department from [facility] nursing facility via EMS due to a syncopal episode.  She was seen in the ED yesterday and was diagnosed with influenza and started on Tamiflu  today, while in the ED, O2 sat was low to mid 90s which dropped further when she falls asleep, she was initially going to be admitted, but O2 sat seemed to normalize and was discharged back to nursing facility.  Also with notable diarrhea after starting her on the Tamiflu .  On day of admission, while being bathed on a shower chair, she had a witnessed syncopal episode and EMS was activated, but patient was already awake when EMS arrived.  As EMS team was transferring patient to stretcher, she had another syncopal episode which lasted about 45 seconds.  100 cc of IV fluid was provided in the field.  Admitted to hospital for the same.  While in house, she improved rapidly.  By AM of HD#2, she was off of oxygen and able to ambulate around her room.  Safety sitter ordered b/c of her dementia.  No further issues  with syncope while in house.  Oxygen levels remained normal despite no supplemental oxygen.  Due to persistent improvement, she was able to be DC'ed 09/11/2024 on HD#3.       Assessment & Plan: Syncope Initial troponins 45, downtrending to 43. - NO further syncopal episodes while in house.   - deemed secondary to fluid losses from diarrhea and flu infection preceding hospitalization.  - Did well with IVF x 24 hours, then stopped and BP remained good.   - Telemetry has been normal thus far. - EKG with some APCs noted otherwise normal. - Repeat Echo showed persistent Grade I DD, similar to prior Echo in 2021.   - Carotid dopplers revealed <50% stenosis BL   Acute respiratory failure in the setting of influenza - resolved prior to DC.   - Started on Tamiflu  and we will hold this for now as evidently it started her with pretty severe diarrhea outpatient. - As she is awake, alert, at her baseline mental status per her son and the fact that she no longer requires oxygen and I think we can continue to observe her off of Tamiflu  and restart if she worsens.     Elevated proBNP proBNP 2,478.  Patient does not appear to be fluid overloaded on clinical exam.  Chest x-ray showed no acute cardiopulmonary disease Continue total input/output, daily weights  Continue heart healthy diet      Continued home lasix, Echo results as above.    Chronic  kidney disease stage 4 Stable. Renally adjust medications, avoid nephrotoxic agents/dehydration/hypotension -Creatinine downtrending.   Essential hypertension BP meds will be held at this time due to low blood pressures. - Restarted blood pressure rises.   Dementia Continue Aricept    Acquired hypothyroidism Continue Synthroid         Procedures performed: none  Disposition: Long term care facility Diet recommendation:  Regular diet DISCHARGE MEDICATION: Allergies as of 09/11/2024   No Known Allergies      Medication List     STOP taking  these medications    calcium carbonate 1500 (600 Ca) MG Tabs tablet Commonly known as: OSCAL   cephALEXin  500 MG capsule Commonly known as: KEFLEX    chlorthalidone 25 MG tablet Commonly known as: HYGROTON   doxycycline  100 MG capsule Commonly known as: VIBRAMYCIN    oseltamivir  75 MG capsule Commonly known as: TAMIFLU    torsemide 5 MG tablet Commonly known as: DEMADEX       TAKE these medications    amLODipine 5 MG tablet Commonly known as: NORVASC Take 5 mg by mouth daily.   calcitRIOL 0.25 MCG capsule Commonly known as: ROCALTROL Take 0.25 mcg by mouth 2 (two) times a week.   CALCIUM & VIT D3 BONE HEALTH PO Take 1 tablet by mouth daily.   Cholecalciferol 25 MCG (1000 UT) tablet Take 400 Units by mouth daily. What changed: Another medication with the same name was removed. Continue taking this medication, and follow the directions you see here.   cyanocobalamin  250 MCG tablet Commonly known as: VITAMIN B12 Take 250 mcg by mouth daily.   divalproex 125 MG capsule Commonly known as: DEPAKOTE SPRINKLE Take 125 mg by mouth 2 (two) times daily. What changed: Another medication with the same name was removed. Continue taking this medication, and follow the directions you see here.   donepezil  10 MG tablet Commonly known as: ARICEPT  Take 10 mg by mouth daily.   ferrous sulfate 325 (65 FE) MG tablet Take 325 mg by mouth daily with breakfast. What changed: Another medication with the same name was removed. Continue taking this medication, and follow the directions you see here.   furosemide 40 MG tablet Commonly known as: LASIX Take 40 mg by mouth daily. What changed: Another medication with the same name was removed. Continue taking this medication, and follow the directions you see here.   guaifenesin  100 MG/5ML syrup Commonly known as: ROBITUSSIN Take 200 mg by mouth 3 (three) times daily.   hydrochlorothiazide  12.5 MG capsule Commonly known as:  MICROZIDE  Take 12.5 mg by mouth every morning.   HYDROcodone -acetaminophen  5-325 MG tablet Commonly known as: NORCO/VICODIN Take 1 tablet by mouth daily as needed.   levothyroxine  75 MCG tablet Commonly known as: SYNTHROID  Take 75 mcg by mouth daily.   Lokelma 5 g packet Generic drug: sodium zirconium cyclosilicate Take 10 g by mouth daily.   LORazepam  0.5 MG tablet Commonly known as: ATIVAN  Take 0.5 mg by mouth 2 (two) times daily. What changed: Another medication with the same name was removed. Continue taking this medication, and follow the directions you see here.   metoprolol  succinate 25 MG 24 hr tablet Commonly known as: Toprol  XL Take 1 tablet (25 mg total) by mouth daily.   sertraline 25 MG tablet Commonly known as: ZOLOFT Take 25 mg by mouth daily.   simvastatin  20 MG tablet Commonly known as: ZOCOR  Take 20 mg by mouth every morning.   traZODone 50 MG tablet Commonly known as: DESYREL Take 50  mg by mouth at bedtime.        Discharge Exam: Filed Weights   09/10/24 0005 09/10/24 0547  Weight: 56.2 kg 57.1 kg   Gen:  Alert, cooperative patient who appears stated age in no acute distress.  Vital signs reviewed. Heart:  RRR Lungs:  some rhonchi at bases, otherwise good air movement Abd:  S/ND/NT Ext:  No LE edema Neuro: oriented only to person.  No focal deficits noted.   Condition at discharge: good  The results of significant diagnostics from this hospitalization (including imaging, microbiology, ancillary and laboratory) are listed below for reference.   Imaging Studies: ECHOCARDIOGRAM COMPLETE Result Date: 09/10/2024    ECHOCARDIOGRAM REPORT   Patient Name:   CHRYL HOLTEN Date of Exam: 09/10/2024 Medical Rec #:  [id number]      Height:       73.0 in Accession #:    [id number]     Weight:       125.9 lb Date of Birth:  12-20-1929     BSA:          1.767 m Patient Age:    89 years       BP:           140/71 mmHg Patient Gender: F              HR:            77 bpm. Exam Location:  [facility] Procedure: 2D Echo, Cardiac Doppler and Color Doppler (Both Spectral and Color            Flow Doppler were utilized during procedure). Indications:    Syncope R55                 CHF-Acute Diastolic I50.31  History:        Patient has prior history of Echocardiogram examinations, most                 recent 10/28/2019. Aortic Valve Disease, Signs/Symptoms:Syncope;                 Risk Factors:Hypertension and Dyslipidemia. Behavioral and                 psychological symptoms of dementia (HCC).  Sonographer:    Aida Pizza RCS Referring Phys: [id number] OLADAPO ADEFESO  Sonographer Comments: Image acquisition challenging due to patient behavioral factors. IMPRESSIONS  1. Left ventricular ejection fraction, by estimation, is 70 to 75%. The left ventricle has hyperdynamic function. The left ventricle has no regional wall motion abnormalities. There is mild concentric left ventricular hypertrophy. Left ventricular diastolic parameters are consistent with Grade I diastolic dysfunction (impaired relaxation).  2. Right ventricular systolic function is normal. The right ventricular size is normal. There is normal pulmonary artery systolic pressure. The estimated right ventricular systolic pressure is 24.9 mmHg.  3. Left atrial size was moderately dilated.  4. The mitral valve is degenerative. Mild mitral valve regurgitation. Moderate to severe mitral annular calcification.  5. Tricuspid valve regurgitation is mild to moderate.  6. The aortic valve has an indeterminant number of cusps. There is severe calcifcation of the aortic valve. Aortic valve regurgitation is not visualized. Severe aortic valve stenosis. Aortic valve mean gradient measures 44.7 mmHg. Dimentionless index 0.27.  7. The inferior vena cava is normal in size with greater than 50% respiratory variability, suggesting right atrial pressure of 3 mmHg. Comparison(s): Prior images reviewed side by side. LVEF 70-75% with grade  1 diastolic dysfunction.  Severe aortic stenosis, AV not well visualized. FINDINGS  Left Ventricle: Left ventricular ejection fraction, by estimation, is 70 to 75%. The left ventricle has hyperdynamic function. The left ventricle has no regional wall motion abnormalities. The left ventricular internal cavity size was normal in size. There is mild concentric left ventricular hypertrophy. Left ventricular diastolic parameters are consistent with Grade I diastolic dysfunction (impaired relaxation). Right Ventricle: The right ventricular size is normal. No increase in right ventricular wall thickness. Right ventricular systolic function is normal. There is normal pulmonary artery systolic pressure. The tricuspid regurgitant velocity is 2.34 m/s, and  with an assumed right atrial pressure of 3 mmHg, the estimated right ventricular systolic pressure is 24.9 mmHg. Left Atrium: Left atrial size was moderately dilated. Right Atrium: Right atrial size was normal in size. Pericardium: There is no evidence of pericardial effusion. Mitral Valve: The mitral valve is degenerative in appearance. There is moderate calcification of the mitral valve leaflet(s). Moderate to severe mitral annular calcification. Mild mitral valve regurgitation. Tricuspid Valve: The tricuspid valve is grossly normal. Tricuspid valve regurgitation is mild to moderate. Aortic Valve: The aortic valve has an indeterminant number of cusps. There is severe calcifcation of the aortic valve. There is moderate aortic valve annular calcification. Aortic valve regurgitation is not visualized. Severe aortic stenosis is present. Aortic valve mean gradient measures 44.7 mmHg. Aortic valve peak gradient measures 83.2 mmHg. Pulmonic Valve: The pulmonic valve was grossly normal. Pulmonic valve regurgitation is trivial. Aorta: The aortic root is normal in size and structure. Venous: The inferior vena cava is normal in size with greater than 50% respiratory variability,  suggesting right atrial pressure of 3 mmHg. IAS/Shunts: No atrial level shunt detected by color flow Doppler. Additional Comments: 3D was performed not requiring image post processing on an independent workstation and was indeterminate.  LEFT VENTRICLE PLAX 2D LVIDd:         5.00 cm LVIDs:         2.60 cm LV PW:         1.20 cm LV IVS:        1.20 cm  RIGHT VENTRICLE TAPSE (M-mode): 1.9 cm LEFT ATRIUM             Index        RIGHT ATRIUM           Index LA diam:        3.40 cm 1.92 cm/m   RA Area:     13.20 cm LA Vol (A2C):   49.6 ml 28.07 ml/m  RA Volume:   31.10 ml  17.60 ml/m LA Vol (A4C):   76.7 ml 43.40 ml/m LA Biplane Vol: 63.4 ml 35.87 ml/m  AORTIC VALVE AV Vmax:           456.00 cm/s AV Vmean:          301.667 cm/s AV VTI:            0.788 m AV Peak Grad:      83.2 mmHg AV Mean Grad:      44.7 mmHg LVOT Vmax:         108.00 cm/s LVOT Vmean:        68.500 cm/s LVOT VTI:          0.213 m LVOT/AV VTI ratio: 0.27  AORTA Ao Root diam: 3.20 cm MITRAL VALVE                TRICUSPID VALVE MV Area (PHT): 1.74 cm  TR Peak grad:   21.9 mmHg MV Decel Time: 437 msec     TR Vmax:        234.00 cm/s MV E velocity: 89.70 cm/s MV A velocity: 133.00 cm/s  SHUNTS MV E/A ratio:  0.67         Systemic VTI: 0.21 m Jayson Sierras MD Electronically signed by Jayson Sierras MD Signature Date/Time: 09/10/2024/4:51:05 PM    Final    US  Carotid Bilateral Result Date: 09/10/2024 CLINICAL DATA:  Syncope. EXAM: BILATERAL CAROTID DUPLEX ULTRASOUND TECHNIQUE: Elnor scale imaging, color Doppler and duplex ultrasound were performed of bilateral carotid and vertebral arteries in the neck. COMPARISON:  None Available. FINDINGS: Criteria: Quantification of carotid stenosis is based on velocity parameters that correlate the residual internal carotid diameter with NASCET-based stenosis levels, using the diameter of the distal internal carotid lumen as the denominator for stenosis measurement. The following velocity measurements were  obtained: RIGHT ICA: 75/30 cm/sec CCA: 64/17 cm/sec SYSTOLIC ICA/CCA RATIO:  1.2 ECA: 70 cm/sec LEFT ICA: 55/16 cm/sec CCA: 56/12 cm/sec SYSTOLIC ICA/CCA RATIO:  1.0 ECA: 64 cm/sec RIGHT CAROTID ARTERY: Small amount of plaque at the right carotid bulb. External carotid artery is patent with normal waveform. Normal waveforms and velocities in the internal carotid artery. RIGHT VERTEBRAL ARTERY: Antegrade flow and normal waveform in the right vertebral artery. LEFT CAROTID ARTERY: Small amount of echogenic plaque at the left carotid bulb. External carotid artery is patent with normal waveform. Normal waveforms and velocities in the internal carotid artery. LEFT VERTEBRAL ARTERY: Antegrade flow and normal waveform in the left vertebral artery. IMPRESSION: 1. Small amount of plaque at the carotid bulbs. Estimated degree of stenosis in the internal carotid arteries is less than 50% bilaterally. 2. Patent vertebral arteries with antegrade flow. Electronically Signed   By: Juliene Balder M.D.   On: 09/10/2024 09:42   CT HEAD WO CONTRAST Result Date: 09/09/2024 EXAM: CT HEAD WITHOUT CONTRAST 09/09/2024 08:04:23 PM TECHNIQUE: CT of the head was performed without the administration of intravenous contrast. Automated exposure control, iterative reconstruction, and/or weight based adjustment of the mA/kV was utilized to reduce the radiation dose to as low as reasonably achievable. COMPARISON: 08/30/2024 CLINICAL HISTORY: Syncope/presyncope, cerebrovascular cause suspected. FINDINGS: BRAIN AND VENTRICLES: No acute hemorrhage. No evidence of acute infarct. No hydrocephalus. No extra-axial collection. No mass effect or midline shift. Atrophy and chronic small vessel disease throughout the deep white matter. ORBITS: No acute abnormality. SINUSES: Again noted is complete opacification of the left maxillary sinus with expansion of the sinus and extension of soft tissue through the medial wall and into the left nasal cavity and  nasopharynx. This is unchanged from prior study. SOFT TISSUES AND SKULL: No acute soft tissue abnormality. No skull fracture. IMPRESSION: 1. No acute intracranial abnormality. 2. Atrophy and chronic small vessel disease throughout the deep white matter. 3. Complete opacification of the left maxillary sinus with expansion of the sinus and extension of soft tissue through the medial wall into the left nasal cavity and nasopharynx, unchanged from prior study. Recommend nonemergent ENT evaluation if not already obtained. Electronically signed by: Franky Crease MD MD 09/09/2024 08:10 PM EST RP Workstation: [id number]   DG Chest Port 1 View Result Date: 09/09/2024 EXAM: 1 VIEW(S) XRAY OF THE CHEST 09/09/2024 08:02:00 PM COMPARISON: 09/08/2024 CLINICAL HISTORY: syncope FINDINGS: LUNGS AND PLEURA: No focal pulmonary opacity. No pleural effusion. No pneumothorax. HEART AND MEDIASTINUM: Aortic atherosclerosis. BONES AND SOFT TISSUES: No acute osseous abnormality. IMPRESSION: 1. No acute  cardiopulmonary disease. Electronically signed by: Franky Crease MD MD 09/09/2024 08:05 PM EST RP Workstation: [id number]   DG Chest Portable 1 View Result Date: 09/08/2024 EXAM: 1 VIEW(S) XRAY OF THE CHEST 09/08/2024 06:35:00 PM COMPARISON: 06/23/2024 CLINICAL HISTORY: influenza positive, transient hypoxia FINDINGS: LUNGS AND PLEURA: Chronic coarsened markings. No pulmonary edema. No pleural effusion. No pneumothorax. HEART AND MEDIASTINUM: Atherosclerotic plaque noted. No acute abnormality of the cardiac and mediastinal silhouettes. BONES AND SOFT TISSUES: Osteopenia. IMPRESSION: 1. No acute findings. 2. Chronic coarsened pulmonary markings without pulmonary edema. Electronically signed by: Elsie Gravely MD 09/08/2024 06:58 PM EST RP Workstation: [id number]   CT Maxillofacial Wo Contrast Result Date: 08/30/2024 EXAM: CT OF THE FACE WITHOUT CONTRAST 08/30/2024 09:45:29 AM TECHNIQUE: CT of the face was performed without the  administration of intravenous contrast. Multiplanar reformatted images are provided for review. Automated exposure control, iterative reconstruction, and/or weight based adjustment of the mA/kV was utilized to reduce the radiation dose to as low as reasonably achievable. COMPARISON: CT head 06/23/2024 and same day CT head. CLINICAL HISTORY: fall FINDINGS: FACIAL BONES: Bilateral nasal bone deformities, favored to be chronic, with no overlying soft tissue swelling. Mild lateral apex angulation of the right nasal bone. There are no findings to suggest acute maxillofacial fractures. Edentulous maxilla with associated chronic bone loss. Degenerative changes of the temporomandibular joints bilaterally. No mandibular dislocation. No suspicious bone lesion. ORBITS: Globes are intact. Postsurgical changes of both globes. Mild left periorbital soft tissue swelling, particularly along the superolateral aspects of the left orbit. No acute traumatic injury. No inflammatory change. SINUSES AND MASTOIDS: Complete opacification of the left maxillary sinus with a mildly expanded appearance. Mild thickening of the maxillary sinus walls suggestive of mucoperiosteal reaction. Internal hyperattenuating components are present, which may reflect fungal colonization versus inspissated secretions. Soft tissue is noted extending through and expanding the antrum of the left maxillary sinus with abnormal soft tissue within the left nasal cavity partially extending into the left nasopharynx. Underlying antrochoanal polyp or other sinonasal mass cannot be excluded. Recommend nonemergent ENT evaluation. Additional mild mucosal thickening in the left ethmoid sinus. No air fluid levels. No acute abnormality of the mastoids. SOFT TISSUES: No acute abnormality. IMPRESSION: 1. No acute maxillofacial fracture. 2. Chronic-appearing bilateral nasal bone deformities. 3. Mild left periorbital soft tissue swelling. 4. Complete opacification of the left  maxillary sinus. Abnormal soft tissue extends into the left nasal cavity and partially into the left nasopharynx. Underlying antrochoanal polyp or other sinonasal mass cannot be excluded. Recommend nonemergent ENT evaluation. Electronically signed by: Donnice Mania MD 08/30/2024 10:44 AM EST RP Workstation: [id number]   CT Cervical Spine Wo Contrast Result Date: 08/30/2024 EXAM: CT CERVICAL SPINE WITHOUT CONTRAST 08/30/2024 09:45:29 AM TECHNIQUE: CT of the cervical spine was performed without the administration of intravenous contrast. Multiplanar reformatted images are provided for review. Automated exposure control, iterative reconstruction, and/or weight based adjustment of the mA/kV was utilized to reduce the radiation dose to as low as reasonably achievable. COMPARISON: 06/23/2024 CLINICAL HISTORY: fall FINDINGS: BONES AND ALIGNMENT: No acute fracture or traumatic malalignment. DEGENERATIVE CHANGES: Degenerative endplate osteophytes and disc space narrowing at multiple levels. Facet arthrosis and uncovertebral hypertrophy are present at multiple levels. Foraminal stenosis is most pronounced at C5-C6. There is no high grade osseous spinal canal stenosis. SOFT TISSUES: No prevertebral soft tissue swelling. VASCULATURE: Atherosclerosis at the carotid bifurcations. IMPRESSION: 1. No evidence of acute traumatic injury. Electronically signed by: Donnice Mania MD 08/30/2024 10:33 AM EST RP Workstation: [id number]  CT Head Wo Contrast Result Date: 08/30/2024 EXAM: CT HEAD WITHOUT CONTRAST 08/30/2024 09:45:29 AM TECHNIQUE: CT of the head was performed without the administration of intravenous contrast. Automated exposure control, iterative reconstruction, and/or weight based adjustment of the mA/kV was utilized to reduce the radiation dose to as low as reasonably achievable. COMPARISON: 06/23/2024 CLINICAL HISTORY: fall FINDINGS: BRAIN AND VENTRICLES: No acute hemorrhage. No evidence of acute infarct. No  hydrocephalus. No extra-axial collection. No mass effect or midline shift. Age-related atrophy and periventricular white matter disease. There is slight left temporal predominance of volume loss. Vascular calcifications. ORBITS: Postsurgical changes in both globes. SINUSES: There is complete opacification of the left maxillary sinus with internal areas of hyperattenuation which may reflect fungal colonization or inspissated secretions. There is soft tissue extending through and likely expanding the antrum of the maxillary sinus; underlying antrochoanal polyp cannot be excluded. SOFT TISSUES AND SKULL: Mild soft tissue swelling over the left supraorbital ridge and along the superior and lateral aspects of the left orbit. No skull fracture. IMPRESSION: 1. No acute intracranial abnormality. 2. Mild left periorbital soft tissue swelling. 3. Complete opacification of the left maxillary sinus with internal hyperattenuation, possibly reflecting fungal colonization or inspissated secretions. Soft tissue extends through and expands the antrum, underlying antrochoanal polyp cannot be excluded. Electronically signed by: Donnice Mania MD 08/30/2024 10:27 AM EST RP Workstation: [id number]    Microbiology: Results for orders placed or performed during the hospital encounter of 09/09/24  Resp panel by RT-PCR (RSV, Flu A&B, Covid) Anterior Nasal Swab     Status: Abnormal   Collection Time: 09/09/24 10:55 PM   Specimen: Anterior Nasal Swab  Result Value Ref Range Status   SARS Coronavirus 2 by RT PCR NEGATIVE NEGATIVE Final    Comment: (NOTE) SARS-CoV-2 target nucleic acids are NOT DETECTED.  The SARS-CoV-2 RNA is generally detectable in upper respiratory specimens during the acute phase of infection. The lowest concentration of SARS-CoV-2 viral copies this assay can detect is 138 copies/mL. A negative result does not preclude SARS-Cov-2 infection and should not be used as the sole basis for treatment or other  patient management decisions. A negative result may occur with  improper specimen collection/handling, submission of specimen other than nasopharyngeal swab, presence of viral mutation(s) within the areas targeted by this assay, and inadequate number of viral copies(<138 copies/mL). A negative result must be combined with clinical observations, patient history, and epidemiological information. The expected result is Negative.  Fact Sheet for Patients:  bloggercourse.com  Fact Sheet for Healthcare Providers:  seriousbroker.it  This test is no t yet approved or cleared by the [place]  FDA and  has been authorized for detection and/or diagnosis of SARS-CoV-2 by FDA under an Emergency Use Authorization (EUA). This EUA will remain  in effect (meaning this test can be used) for the duration of the COVID-19 declaration under Section 564(b)(1) of the Act, 21 U.S.C.section 360bbb-3(b)(1), unless the authorization is terminated  or revoked sooner.       Influenza A by PCR POSITIVE (A) NEGATIVE Final   Influenza B by PCR NEGATIVE NEGATIVE Final    Comment: (NOTE) The Xpert Xpress SARS-CoV-2/FLU/RSV plus assay is intended as an aid in the diagnosis of influenza from Nasopharyngeal swab specimens and should not be used as a sole basis for treatment. Nasal washings and aspirates are unacceptable for Xpert Xpress SARS-CoV-2/FLU/RSV testing.  Fact Sheet for Patients: bloggercourse.com  Fact Sheet for Healthcare Providers: seriousbroker.it  This test is not yet approved or cleared  by the [place]  FDA and has been authorized for detection and/or diagnosis of SARS-CoV-2 by FDA under an Emergency Use Authorization (EUA). This EUA will remain in effect (meaning this test can be used) for the duration of the COVID-19 declaration under Section 564(b)(1) of the Act, 21 U.S.C. section  360bbb-3(b)(1), unless the authorization is terminated or revoked.     Resp Syncytial Virus by PCR NEGATIVE NEGATIVE Final    Comment: (NOTE) Fact Sheet for Patients: bloggercourse.com  Fact Sheet for Healthcare Providers: seriousbroker.it  This test is not yet approved or cleared by the [place]  FDA and has been authorized for detection and/or diagnosis of SARS-CoV-2 by FDA under an Emergency Use Authorization (EUA). This EUA will remain in effect (meaning this test can be used) for the duration of the COVID-19 declaration under Section 564(b)(1) of the Act, 21 U.S.C. section 360bbb-3(b)(1), unless the authorization is terminated or revoked.  Performed at [facility], [address], [place], [place] [address]     Labs: CBC: Recent Labs  Lab 09/08/24 2248 09/09/24 2018 09/10/24 0514 09/11/24 0433  WBC 7.0 4.8 4.5 6.2  NEUTROABS  --  3.5  --   --   HGB 12.2 15.4* 11.1* 11.2*  HCT 38.2 47.7* 34.2* 34.4*  MCV 94.3 93.5 94.7 93.7  PLT 200 119* 163 179   Basic Metabolic Panel: Recent Labs  Lab 09/08/24 2248 09/09/24 2018 09/10/24 0514 09/11/24 0433  NA 137 135 137 138  K 4.2 3.4* 3.9 4.2  CL 97* 96* 99 100  CO2 32 20* 33* 33*  GLUCOSE 114* 169* 84 82  BUN 24* 31* 33* 26*  CREATININE 1.48* 1.69* 1.43* 1.18*  CALCIUM 9.2 9.0 8.6* 9.0  MG  --  2.2 2.1  --   PHOS  --   --  4.1  --    Liver Function Tests: Recent Labs  Lab 09/09/24 2018 09/10/24 0514  AST 43* 32  ALT 14 12  ALKPHOS 64 55  BILITOT 0.4 0.3  PROT 6.6 5.6*  ALBUMIN 4.0 3.7   CBG: No results for input(s): GLUCAP in the last 168 hours.  Discharge time spent: less than 30 minutes.  Signed: Reyes VEAR Gaw, MD [facility] 09/11/2024 "

## 2024-09-26 ENCOUNTER — Other Ambulatory Visit: Payer: Self-pay

## 2024-09-26 ENCOUNTER — Encounter (HOSPITAL_COMMUNITY): Payer: Self-pay

## 2024-09-26 ENCOUNTER — Emergency Department (HOSPITAL_COMMUNITY)
Admission: EM | Admit: 2024-09-26 | Discharge: 2024-09-26 | Disposition: A | Discharge status: Home / Self Care | Attending: Emergency Medicine | Admitting: Emergency Medicine

## 2024-09-26 ENCOUNTER — Emergency Department (HOSPITAL_COMMUNITY)

## 2024-09-26 DIAGNOSIS — F0390 Unspecified dementia without behavioral disturbance: Secondary | ICD-10-CM | POA: Diagnosis not present

## 2024-09-26 DIAGNOSIS — W1830XA Fall on same level, unspecified, initial encounter: Secondary | ICD-10-CM | POA: Diagnosis not present

## 2024-09-26 DIAGNOSIS — N179 Acute kidney failure, unspecified: Secondary | ICD-10-CM | POA: Insufficient documentation

## 2024-09-26 DIAGNOSIS — W19XXXA Unspecified fall, initial encounter: Secondary | ICD-10-CM

## 2024-09-26 DIAGNOSIS — R7989 Other specified abnormal findings of blood chemistry: Secondary | ICD-10-CM | POA: Insufficient documentation

## 2024-09-26 DIAGNOSIS — S0101XA Laceration without foreign body of scalp, initial encounter: Secondary | ICD-10-CM | POA: Insufficient documentation

## 2024-09-26 LAB — COMPREHENSIVE METABOLIC PANEL WITH GFR
ALT: 9 U/L (ref 0–44)
AST: 18 U/L (ref 15–41)
Albumin: 4 g/dL (ref 3.5–5.0)
Alkaline Phosphatase: 83 U/L (ref 38–126)
Anion gap: 15 (ref 5–15)
BUN: 44 mg/dL — ABNORMAL HIGH (ref 8–23)
CO2: 27 mmol/L (ref 22–32)
Calcium: 9.5 mg/dL (ref 8.9–10.3)
Chloride: 97 mmol/L — ABNORMAL LOW (ref 98–111)
Creatinine, Ser: 1.8 mg/dL — ABNORMAL HIGH (ref 0.44–1.00)
GFR, Estimated: 26 mL/min — ABNORMAL LOW
Glucose, Bld: 140 mg/dL — ABNORMAL HIGH (ref 70–99)
Potassium: 3.6 mmol/L (ref 3.5–5.1)
Sodium: 139 mmol/L (ref 135–145)
Total Bilirubin: 0.2 mg/dL (ref 0.0–1.2)
Total Protein: 6.4 g/dL — ABNORMAL LOW (ref 6.5–8.1)

## 2024-09-26 LAB — CBC
HCT: 33.9 % — ABNORMAL LOW (ref 36.0–46.0)
Hemoglobin: 11 g/dL — ABNORMAL LOW (ref 12.0–15.0)
MCH: 30.8 pg (ref 26.0–34.0)
MCHC: 32.4 g/dL (ref 30.0–36.0)
MCV: 95 fL (ref 80.0–100.0)
Platelets: 243 10*3/uL (ref 150–400)
RBC: 3.57 MIL/uL — ABNORMAL LOW (ref 3.87–5.11)
RDW: 12.3 % (ref 11.5–15.5)
WBC: 6.3 10*3/uL (ref 4.0–10.5)
nRBC: 0 % (ref 0.0–0.2)

## 2024-09-26 MED ORDER — LACTATED RINGERS IV BOLUS
1000.0000 mL | Freq: Once | INTRAVENOUS | Status: AC
  Administered 2024-09-26: 1000 mL via INTRAVENOUS

## 2024-09-26 MED ORDER — LIDOCAINE-EPINEPHRINE (PF) 2 %-1:200000 IJ SOLN
20.0000 mL | Freq: Once | INTRAMUSCULAR | Status: AC
  Administered 2024-09-26: 20 mL
  Filled 2024-09-26: qty 20

## 2024-09-26 NOTE — ED Notes (Signed)
 The patient has been given orange juice.

## 2024-09-26 NOTE — ED Notes (Signed)
Dr. Steinl at bedside 

## 2024-09-26 NOTE — ED Provider Notes (Signed)
 " [place] EMERGENCY DEPARTMENT AT [facility] Provider Note   CSN: [id number] Arrival date & time: 09/26/24  1514     Patient presents with: Brittany Holden is a 89 y.o. female.   Pt from SNF, hx dementia, with fall. Is supposed to walk with walker, unclear if patient had it with her when fell, hx falls. No noted loc. Pt very limited historian - level 5 caveat. No report of fevers. No emesis. Pt was noted with contusion and laceration to posterior scalp. Tetanus is up to date. No anticoagulant use.   The history is provided by the patient, medical records, a relative and the EMS personnel. The history is limited by the condition of the patient.  Fall Pertinent negatives include no chest pain, no abdominal pain, no headaches and no shortness of breath.       Prior to Admission medications  Medication Sig Start Date End Date Taking? Authorizing Provider  amLODipine (NORVASC) 5 MG tablet Take 5 mg by mouth daily.    [provider]  calcitRIOL (ROCALTROL) 0.25 MCG capsule Take 0.25 mcg by mouth 2 (two) times a week. 04/08/23   [provider]  Cholecalciferol 25 MCG (1000 UT) tablet Take 400 Units by mouth daily. Patient not taking: Reported on 10/03/2023    [provider]  cyanocobalamin  (VITAMIN B12) 250 MCG tablet Take 250 mcg by mouth daily.    [provider]  divalproex (DEPAKOTE SPRINKLE) 125 MG capsule Take 125 mg by mouth 2 (two) times daily.    [provider]  donepezil  (ARICEPT ) 10 MG tablet Take 10 mg by mouth daily. 08/30/23   [provider]  ferrous sulfate 325 (65 FE) MG tablet Take 325 mg by mouth daily with breakfast.    [provider]  furosemide (LASIX) 40 MG tablet Take 40 mg by mouth daily. 05/07/24   [provider]  guaifenesin  (ROBITUSSIN) 100 MG/5ML syrup Take 200 mg by mouth 3 (three) times daily.    [provider]  hydrochlorothiazide  (MICROZIDE ) 12.5 MG capsule  Take 12.5 mg by mouth every morning. Patient not taking: Reported on 10/03/2023 04/08/23   [provider]  HYDROcodone -acetaminophen  (NORCO/VICODIN) 5-325 MG tablet Take 1 tablet by mouth daily as needed. 05/19/24   [provider]  levothyroxine  (SYNTHROID , LEVOTHROID) 75 MCG tablet Take 75 mcg by mouth daily.      [provider]  LORazepam  (ATIVAN ) 0.5 MG tablet Take 0.5 mg by mouth 2 (two) times daily.    [provider]  metoprolol  succinate (TOPROL  XL) 25 MG 24 hr tablet Take 1 tablet (25 mg total) by mouth daily. 04/20/20   Okey Vina GAILS, MD  Multiple Minerals-Vitamins (CALCIUM & VIT D3 BONE HEALTH PO) Take 1 tablet by mouth daily. Patient not taking: Reported on 05/04/2022    [provider]  sertraline (ZOLOFT) 25 MG tablet Take 25 mg by mouth daily. 05/07/24   [provider]  simvastatin  (ZOCOR ) 20 MG tablet Take 20 mg by mouth every morning.     [provider]  sodium zirconium cyclosilicate (LOKELMA) 5 g packet Take 10 g by mouth daily.    [provider]  traZODone (DESYREL) 50 MG tablet Take 50 mg by mouth at bedtime. 05/07/24   [provider]    Allergies: Patient has no known allergies.    Review of Systems  Constitutional:  Negative for fever.  HENT:  Negative for nosebleeds.   Respiratory:  Negative for shortness of breath.   Cardiovascular:  Negative for chest pain.  Gastrointestinal:  Negative for abdominal pain and vomiting.  Musculoskeletal:  Negative for back pain and neck pain.  Skin:  Positive for wound.  Neurological:  Negative for headaches.    Updated Vital Signs BP 130/64 (BP Location: Left Arm)   Pulse 79   Temp 97.6 F (36.4 C) (Oral)   Resp 18   SpO2 96%   Physical Exam Vitals and nursing note reviewed.  Constitutional:      Appearance: Normal appearance. She is well-developed.  HENT:     Head:     Comments: Contusion/lac to posterior scalp.     Nose: Nose normal.      Mouth/Throat:     Mouth: Mucous membranes are moist.  Eyes:     General: No scleral icterus.    Conjunctiva/sclera: Conjunctivae normal.     Pupils: Pupils are equal, round, and reactive to light.  Neck:     Trachea: No tracheal deviation.  Cardiovascular:     Rate and Rhythm: Normal rate and regular rhythm.     Pulses: Normal pulses.     Heart sounds: Normal heart sounds. No murmur heard.    No friction rub. No gallop.  Pulmonary:     Effort: Pulmonary effort is normal. No respiratory distress.     Breath sounds: Normal breath sounds.  Chest:     Chest wall: No tenderness.  Abdominal:     General: Bowel sounds are normal. There is no distension.     Palpations: Abdomen is soft.     Tenderness: There is no abdominal tenderness. There is no guarding.  Genitourinary:    Comments: No cva tenderness.  Musculoskeletal:        General: No swelling.     Cervical back: Normal range of motion and neck supple. No rigidity. No muscular tenderness.     Comments: CTLS spine, non tender, aligned, no step off. Good rom bil extremities without pain or focal bony tenderness.   Skin:    General: Skin is warm and dry.     Findings: No rash.  Neurological:     Mental Status: She is alert.     Comments: Alert, speech normal. HOH. Motor/sens grossly intact bil.   Psychiatric:        Mood and Affect: Mood normal.     (all labs ordered are listed, but only abnormal results are displayed) Results for orders placed or performed during the hospital encounter of 09/26/24  Comprehensive metabolic panel   Collection Time: 09/26/24  3:46 PM  Result Value Ref Range   Sodium 139 135 - 145 mmol/L   Potassium 3.6 3.5 - 5.1 mmol/L   Chloride 97 (L) 98 - 111 mmol/L   CO2 27 22 - 32 mmol/L   Glucose, Bld 140 (H) 70 - 99 mg/dL   BUN 44 (H) 8 - 23 mg/dL   Creatinine, Ser 8.19 (H) 0.44 - 1.00 mg/dL   Calcium 9.5 8.9 - 89.6 mg/dL   Total Protein 6.4 (L) 6.5 - 8.1 g/dL   Albumin 4.0 3.5 - 5.0 g/dL   AST  18 15 - 41 U/L   ALT 9 0 - 44 U/L   Alkaline Phosphatase 83 38 - 126 U/L   Total Bilirubin 0.2 0.0 - 1.2 mg/dL   GFR, Estimated 26 (L) >60 mL/min   Anion gap 15 5 - 15  CBC   Collection Time: 09/26/24  3:46 PM  Result Value Ref Range   WBC 6.3 4.0 - 10.5 K/uL   RBC 3.57 (L) 3.87 - 5.11 MIL/uL   Hemoglobin 11.0 (L) 12.0 - 15.0 g/dL   HCT 66.0 (L) 63.9 - 53.9 %   MCV 95.0 80.0 - 100.0 fL   MCH 30.8 26.0 - 34.0 pg   MCHC 32.4 30.0 - 36.0 g/dL   RDW 87.6 88.4 - 84.4 %   Platelets 243 150 - 400 K/uL   nRBC 0.0 0.0 - 0.2 %   CT Cervical Spine Wo Contrast Result Date: 09/26/2024 CLINICAL DATA:  Unwitnessed fall. Blunt head and neck trauma. Scalp laceration. Dementia. EXAM: CT HEAD WITHOUT CONTRAST CT CERVICAL SPINE WITHOUT CONTRAST TECHNIQUE: Multidetector CT imaging of the head and cervical spine was performed following the standard protocol without intravenous contrast. Multiplanar CT image reconstructions of the cervical spine were also generated. RADIATION DOSE REDUCTION: This exam was performed according to the departmental dose-optimization program which includes automated exposure control, adjustment of the mA and/or kV according to patient size and/or use of iterative reconstruction technique. COMPARISON:  09/09/2024 and 08/30/2024 FINDINGS: CT HEAD FINDINGS Brain: No evidence of intracranial hemorrhage, acute infarction, hydrocephalus, extra-axial collection, or mass lesion/mass effect. Moderate diffuse cerebral atrophy and severe chronic small vessel disease show no significant change. Vascular:  No hyperdense vessel or other acute findings. Skull: No evidence of fracture or other significant bone abnormality. Sinuses/Orbits: Complete opacification of left maxillary sinus and extension of soft tissue density through the medial wall and into the left nasal cavity is unchanged in appearance. Other: None. CT CERVICAL SPINE FINDINGS Alignment: Normal. Skull base and vertebrae: No acute fracture.  No primary bone lesion or focal pathologic process. Soft tissues and spinal canal: No prevertebral fluid or swelling. No visible canal hematoma. Disc levels: Mild degenerative disc disease and bilateral uncovertebral spurring seen from level of C3-C7. Upper chest: No acute findings. Other: None. IMPRESSION: No acute intracranial abnormality. Stable cerebral atrophy and chronic small vessel disease. No evidence of acute cervical spine fracture or subluxation. Degenerative spondylosis, as described above. Stable complete opacification of left maxillary sinus, with extension of soft tissue density through the medial wall and into the left nasal cavity. Differential diagnosis includes chronic sinusitis in neoplasm. Suggest ENT consultation if not already obtained. Electronically Signed   By: Norleen DELENA Kil M.D.   On: 09/26/2024 17:01   CT Head Wo Contrast Result Date: 09/26/2024 CLINICAL DATA:  Unwitnessed fall. Blunt head and neck trauma. Scalp laceration. Dementia. EXAM: CT HEAD WITHOUT CONTRAST CT CERVICAL SPINE WITHOUT CONTRAST TECHNIQUE: Multidetector CT imaging of the head and cervical spine was performed following the standard protocol without intravenous contrast. Multiplanar CT image reconstructions of the cervical spine were also generated. RADIATION DOSE REDUCTION: This exam was performed according to the departmental dose-optimization program which includes automated exposure control, adjustment of the mA and/or kV according to patient size and/or use of iterative reconstruction technique. COMPARISON:  09/09/2024 and 08/30/2024 FINDINGS: CT HEAD FINDINGS Brain: No evidence of intracranial hemorrhage, acute infarction, hydrocephalus, extra-axial collection, or mass lesion/mass effect. Moderate diffuse cerebral atrophy and severe chronic small vessel disease show no significant change. Vascular:  No hyperdense vessel or other acute findings. Skull: No evidence of fracture or other significant bone  abnormality. Sinuses/Orbits: Complete opacification of left maxillary sinus and extension of soft tissue density through the medial wall and into the left nasal cavity is unchanged in appearance. Other: None. CT CERVICAL SPINE FINDINGS Alignment: Normal. Skull base and vertebrae: No  acute fracture. No primary bone lesion or focal pathologic process. Soft tissues and spinal canal: No prevertebral fluid or swelling. No visible canal hematoma. Disc levels: Mild degenerative disc disease and bilateral uncovertebral spurring seen from level of C3-C7. Upper chest: No acute findings. Other: None. IMPRESSION: No acute intracranial abnormality. Stable cerebral atrophy and chronic small vessel disease. No evidence of acute cervical spine fracture or subluxation. Degenerative spondylosis, as described above. Stable complete opacification of left maxillary sinus, with extension of soft tissue density through the medial wall and into the left nasal cavity. Differential diagnosis includes chronic sinusitis in neoplasm. Suggest ENT consultation if not already obtained. Electronically Signed   By: Norleen DELENA Kil M.D.   On: 09/26/2024 17:01   ECHOCARDIOGRAM COMPLETE Result Date: 09/10/2024    ECHOCARDIOGRAM REPORT   Patient Name:   Brittany Holden Date of Exam: 09/10/2024 Medical Rec #:  [id number]      Height:       73.0 in Accession #:    [id number]     Weight:       125.9 lb Date of Birth:  [birth year]     BSA:          1.767 m Patient Age:    89 years       BP:           140/71 mmHg Patient Gender: F              HR:           77 bpm. Exam Location:  [facility] Procedure: 2D Echo, Cardiac Doppler and Color Doppler (Both Spectral and Color            Flow Doppler were utilized during procedure). Indications:    Syncope R55                 CHF-Acute Diastolic I50.31  History:        Patient has prior history of Echocardiogram examinations, most                 recent 10/28/2019. Aortic Valve Disease, Signs/Symptoms:Syncope;                  Risk Factors:Hypertension and Dyslipidemia. Behavioral and                 psychological symptoms of dementia (HCC).  Sonographer:    Aida Pizza RCS Referring Phys: [id number] OLADAPO ADEFESO  Sonographer Comments: Image acquisition challenging due to patient behavioral factors. IMPRESSIONS  1. Left ventricular ejection fraction, by estimation, is 70 to 75%. The left ventricle has hyperdynamic function. The left ventricle has no regional wall motion abnormalities. There is mild concentric left ventricular hypertrophy. Left ventricular diastolic parameters are consistent with Grade I diastolic dysfunction (impaired relaxation).  2. Right ventricular systolic function is normal. The right ventricular size is normal. There is normal pulmonary artery systolic pressure. The estimated right ventricular systolic pressure is 24.9 mmHg.  3. Left atrial size was moderately dilated.  4. The mitral valve is degenerative. Mild mitral valve regurgitation. Moderate to severe mitral annular calcification.  5. Tricuspid valve regurgitation is mild to moderate.  6. The aortic valve has an indeterminant number of cusps. There is severe calcifcation of the aortic valve. Aortic valve regurgitation is not visualized. Severe aortic valve stenosis. Aortic valve mean gradient measures 44.7 mmHg. Dimentionless index 0.27.  7. The inferior vena cava is normal in size with greater than 50% respiratory variability, suggesting right atrial pressure  of 3 mmHg. Comparison(s): Prior images reviewed side by side. LVEF 70-75% with grade 1 diastolic dysfunction. Severe aortic stenosis, AV not well visualized. FINDINGS  Left Ventricle: Left ventricular ejection fraction, by estimation, is 70 to 75%. The left ventricle has hyperdynamic function. The left ventricle has no regional wall motion abnormalities. The left ventricular internal cavity size was normal in size. There is mild concentric left ventricular hypertrophy. Left ventricular diastolic  parameters are consistent with Grade I diastolic dysfunction (impaired relaxation). Right Ventricle: The right ventricular size is normal. No increase in right ventricular wall thickness. Right ventricular systolic function is normal. There is normal pulmonary artery systolic pressure. The tricuspid regurgitant velocity is 2.34 m/s, and  with an assumed right atrial pressure of 3 mmHg, the estimated right ventricular systolic pressure is 24.9 mmHg. Left Atrium: Left atrial size was moderately dilated. Right Atrium: Right atrial size was normal in size. Pericardium: There is no evidence of pericardial effusion. Mitral Valve: The mitral valve is degenerative in appearance. There is moderate calcification of the mitral valve leaflet(s). Moderate to severe mitral annular calcification. Mild mitral valve regurgitation. Tricuspid Valve: The tricuspid valve is grossly normal. Tricuspid valve regurgitation is mild to moderate. Aortic Valve: The aortic valve has an indeterminant number of cusps. There is severe calcifcation of the aortic valve. There is moderate aortic valve annular calcification. Aortic valve regurgitation is not visualized. Severe aortic stenosis is present. Aortic valve mean gradient measures 44.7 mmHg. Aortic valve peak gradient measures 83.2 mmHg. Pulmonic Valve: The pulmonic valve was grossly normal. Pulmonic valve regurgitation is trivial. Aorta: The aortic root is normal in size and structure. Venous: The inferior vena cava is normal in size with greater than 50% respiratory variability, suggesting right atrial pressure of 3 mmHg. IAS/Shunts: No atrial level shunt detected by color flow Doppler. Additional Comments: 3D was performed not requiring image post processing on an independent workstation and was indeterminate.  LEFT VENTRICLE PLAX 2D LVIDd:         5.00 cm LVIDs:         2.60 cm LV PW:         1.20 cm LV IVS:        1.20 cm  RIGHT VENTRICLE TAPSE (M-mode): 1.9 cm LEFT ATRIUM              Index        RIGHT ATRIUM           Index LA diam:        3.40 cm 1.92 cm/m   RA Area:     13.20 cm LA Vol (A2C):   49.6 ml 28.07 ml/m  RA Volume:   31.10 ml  17.60 ml/m LA Vol (A4C):   76.7 ml 43.40 ml/m LA Biplane Vol: 63.4 ml 35.87 ml/m  AORTIC VALVE AV Vmax:           456.00 cm/s AV Vmean:          301.667 cm/s AV VTI:            0.788 m AV Peak Grad:      83.2 mmHg AV Mean Grad:      44.7 mmHg LVOT Vmax:         108.00 cm/s LVOT Vmean:        68.500 cm/s LVOT VTI:          0.213 m LVOT/AV VTI ratio: 0.27  AORTA Ao Root diam: 3.20 cm MITRAL VALVE  TRICUSPID VALVE MV Area (PHT): 1.74 cm     TR Peak grad:   21.9 mmHg MV Decel Time: 437 msec     TR Vmax:        234.00 cm/s MV E velocity: 89.70 cm/s MV A velocity: 133.00 cm/s  SHUNTS MV E/A ratio:  0.67         Systemic VTI: 0.21 m Jayson Sierras MD Electronically signed by Jayson Sierras MD Signature Date/Time: 09/10/2024/4:51:05 PM    Final    US  Carotid Bilateral Result Date: 09/10/2024 CLINICAL DATA:  Syncope. EXAM: BILATERAL CAROTID DUPLEX ULTRASOUND TECHNIQUE: Elnor scale imaging, color Doppler and duplex ultrasound were performed of bilateral carotid and vertebral arteries in the neck. COMPARISON:  None Available. FINDINGS: Criteria: Quantification of carotid stenosis is based on velocity parameters that correlate the residual internal carotid diameter with NASCET-based stenosis levels, using the diameter of the distal internal carotid lumen as the denominator for stenosis measurement. The following velocity measurements were obtained: RIGHT ICA: 75/30 cm/sec CCA: 64/17 cm/sec SYSTOLIC ICA/CCA RATIO:  1.2 ECA: 70 cm/sec LEFT ICA: 55/16 cm/sec CCA: 56/12 cm/sec SYSTOLIC ICA/CCA RATIO:  1.0 ECA: 64 cm/sec RIGHT CAROTID ARTERY: Small amount of plaque at the right carotid bulb. External carotid artery is patent with normal waveform. Normal waveforms and velocities in the internal carotid artery. RIGHT VERTEBRAL ARTERY: Antegrade flow and  normal waveform in the right vertebral artery. LEFT CAROTID ARTERY: Small amount of echogenic plaque at the left carotid bulb. External carotid artery is patent with normal waveform. Normal waveforms and velocities in the internal carotid artery. LEFT VERTEBRAL ARTERY: Antegrade flow and normal waveform in the left vertebral artery. IMPRESSION: 1. Small amount of plaque at the carotid bulbs. Estimated degree of stenosis in the internal carotid arteries is less than 50% bilaterally. 2. Patent vertebral arteries with antegrade flow. Electronically Signed   By: Juliene Balder M.D.   On: 09/10/2024 09:42   CT HEAD WO CONTRAST Result Date: 09/09/2024 EXAM: CT HEAD WITHOUT CONTRAST 09/09/2024 08:04:23 PM TECHNIQUE: CT of the head was performed without the administration of intravenous contrast. Automated exposure control, iterative reconstruction, and/or weight based adjustment of the mA/kV was utilized to reduce the radiation dose to as low as reasonably achievable. COMPARISON: 08/30/2024 CLINICAL HISTORY: Syncope/presyncope, cerebrovascular cause suspected. FINDINGS: BRAIN AND VENTRICLES: No acute hemorrhage. No evidence of acute infarct. No hydrocephalus. No extra-axial collection. No mass effect or midline shift. Atrophy and chronic small vessel disease throughout the deep white matter. ORBITS: No acute abnormality. SINUSES: Again noted is complete opacification of the left maxillary sinus with expansion of the sinus and extension of soft tissue through the medial wall and into the left nasal cavity and nasopharynx. This is unchanged from prior study. SOFT TISSUES AND SKULL: No acute soft tissue abnormality. No skull fracture. IMPRESSION: 1. No acute intracranial abnormality. 2. Atrophy and chronic small vessel disease throughout the deep white matter. 3. Complete opacification of the left maxillary sinus with expansion of the sinus and extension of soft tissue through the medial wall into the left nasal cavity and  nasopharynx, unchanged from prior study. Recommend nonemergent ENT evaluation if not already obtained. Electronically signed by: Franky Crease MD MD 09/09/2024 08:10 PM EST RP Workstation: [id number]   DG Chest Port 1 View Result Date: 09/09/2024 EXAM: 1 VIEW(S) XRAY OF THE CHEST 09/09/2024 08:02:00 PM COMPARISON: 09/08/2024 CLINICAL HISTORY: syncope FINDINGS: LUNGS AND PLEURA: No focal pulmonary opacity. No pleural effusion. No pneumothorax. HEART AND MEDIASTINUM: Aortic atherosclerosis. BONES  AND SOFT TISSUES: No acute osseous abnormality. IMPRESSION: 1. No acute cardiopulmonary disease. Electronically signed by: Franky Crease MD MD 09/09/2024 08:05 PM EST RP Workstation: [id number]   DG Chest Portable 1 View Result Date: 09/08/2024 EXAM: 1 VIEW(S) XRAY OF THE CHEST 09/08/2024 06:35:00 PM COMPARISON: 06/23/2024 CLINICAL HISTORY: influenza positive, transient hypoxia FINDINGS: LUNGS AND PLEURA: Chronic coarsened markings. No pulmonary edema. No pleural effusion. No pneumothorax. HEART AND MEDIASTINUM: Atherosclerotic plaque noted. No acute abnormality of the cardiac and mediastinal silhouettes. BONES AND SOFT TISSUES: Osteopenia. IMPRESSION: 1. No acute findings. 2. Chronic coarsened pulmonary markings without pulmonary edema. Electronically signed by: Elsie Gravely MD 09/08/2024 06:58 PM EST RP Workstation: [id number]   CT Maxillofacial Wo Contrast Result Date: 08/30/2024 EXAM: CT OF THE FACE WITHOUT CONTRAST 08/30/2024 09:45:29 AM TECHNIQUE: CT of the face was performed without the administration of intravenous contrast. Multiplanar reformatted images are provided for review. Automated exposure control, iterative reconstruction, and/or weight based adjustment of the mA/kV was utilized to reduce the radiation dose to as low as reasonably achievable. COMPARISON: CT head 06/23/2024 and same day CT head. CLINICAL HISTORY: fall FINDINGS: FACIAL BONES: Bilateral nasal bone deformities, favored to be chronic, with  no overlying soft tissue swelling. Mild lateral apex angulation of the right nasal bone. There are no findings to suggest acute maxillofacial fractures. Edentulous maxilla with associated chronic bone loss. Degenerative changes of the temporomandibular joints bilaterally. No mandibular dislocation. No suspicious bone lesion. ORBITS: Globes are intact. Postsurgical changes of both globes. Mild left periorbital soft tissue swelling, particularly along the superolateral aspects of the left orbit. No acute traumatic injury. No inflammatory change. SINUSES AND MASTOIDS: Complete opacification of the left maxillary sinus with a mildly expanded appearance. Mild thickening of the maxillary sinus walls suggestive of mucoperiosteal reaction. Internal hyperattenuating components are present, which may reflect fungal colonization versus inspissated secretions. Soft tissue is noted extending through and expanding the antrum of the left maxillary sinus with abnormal soft tissue within the left nasal cavity partially extending into the left nasopharynx. Underlying antrochoanal polyp or other sinonasal mass cannot be excluded. Recommend nonemergent ENT evaluation. Additional mild mucosal thickening in the left ethmoid sinus. No air fluid levels. No acute abnormality of the mastoids. SOFT TISSUES: No acute abnormality. IMPRESSION: 1. No acute maxillofacial fracture. 2. Chronic-appearing bilateral nasal bone deformities. 3. Mild left periorbital soft tissue swelling. 4. Complete opacification of the left maxillary sinus. Abnormal soft tissue extends into the left nasal cavity and partially into the left nasopharynx. Underlying antrochoanal polyp or other sinonasal mass cannot be excluded. Recommend nonemergent ENT evaluation. Electronically signed by: Donnice Mania MD 08/30/2024 10:44 AM EST RP Workstation: [id number]   CT Cervical Spine Wo Contrast Result Date: 08/30/2024 EXAM: CT CERVICAL SPINE WITHOUT CONTRAST 08/30/2024  09:45:29 AM TECHNIQUE: CT of the cervical spine was performed without the administration of intravenous contrast. Multiplanar reformatted images are provided for review. Automated exposure control, iterative reconstruction, and/or weight based adjustment of the mA/kV was utilized to reduce the radiation dose to as low as reasonably achievable. COMPARISON: 06/23/2024 CLINICAL HISTORY: fall FINDINGS: BONES AND ALIGNMENT: No acute fracture or traumatic malalignment. DEGENERATIVE CHANGES: Degenerative endplate osteophytes and disc space narrowing at multiple levels. Facet arthrosis and uncovertebral hypertrophy are present at multiple levels. Foraminal stenosis is most pronounced at C5-C6. There is no high grade osseous spinal canal stenosis. SOFT TISSUES: No prevertebral soft tissue swelling. VASCULATURE: Atherosclerosis at the carotid bifurcations. IMPRESSION: 1. No evidence of acute traumatic injury. Electronically signed by: Donnice  Hunt MD 08/30/2024 10:33 AM EST RP Workstation: [id number]   CT Head Wo Contrast Result Date: 08/30/2024 EXAM: CT HEAD WITHOUT CONTRAST 08/30/2024 09:45:29 AM TECHNIQUE: CT of the head was performed without the administration of intravenous contrast. Automated exposure control, iterative reconstruction, and/or weight based adjustment of the mA/kV was utilized to reduce the radiation dose to as low as reasonably achievable. COMPARISON: 06/23/2024 CLINICAL HISTORY: fall FINDINGS: BRAIN AND VENTRICLES: No acute hemorrhage. No evidence of acute infarct. No hydrocephalus. No extra-axial collection. No mass effect or midline shift. Age-related atrophy and periventricular white matter disease. There is slight left temporal predominance of volume loss. Vascular calcifications. ORBITS: Postsurgical changes in both globes. SINUSES: There is complete opacification of the left maxillary sinus with internal areas of hyperattenuation which may reflect fungal colonization or inspissated secretions.  There is soft tissue extending through and likely expanding the antrum of the maxillary sinus; underlying antrochoanal polyp cannot be excluded. SOFT TISSUES AND SKULL: Mild soft tissue swelling over the left supraorbital ridge and along the superior and lateral aspects of the left orbit. No skull fracture. IMPRESSION: 1. No acute intracranial abnormality. 2. Mild left periorbital soft tissue swelling. 3. Complete opacification of the left maxillary sinus with internal hyperattenuation, possibly reflecting fungal colonization or inspissated secretions. Soft tissue extends through and expands the antrum, underlying antrochoanal polyp cannot be excluded. Electronically signed by: Donnice Mania MD 08/30/2024 10:27 AM EST RP Workstation: [id number]     EKG: EKG Interpretation Date/Time:  Saturday September 26 2024 15:24:03 EST Ventricular Rate:  84 PR Interval:  168 QRS Duration:  93 QT Interval:  392 QTC Calculation: 464 R Axis:   29  Text Interpretation: Sinus rhythm Atrial premature complex Nonspecific ST abnormality Confirmed by Bernard Drivers ([id number]) on 09/26/2024 3:29:16 PM  Radiology: CT Cervical Spine Wo Contrast Result Date: 09/26/2024 CLINICAL DATA:  Unwitnessed fall. Blunt head and neck trauma. Scalp laceration. Dementia. EXAM: CT HEAD WITHOUT CONTRAST CT CERVICAL SPINE WITHOUT CONTRAST TECHNIQUE: Multidetector CT imaging of the head and cervical spine was performed following the standard protocol without intravenous contrast. Multiplanar CT image reconstructions of the cervical spine were also generated. RADIATION DOSE REDUCTION: This exam was performed according to the departmental dose-optimization program which includes automated exposure control, adjustment of the mA and/or kV according to patient size and/or use of iterative reconstruction technique. COMPARISON:  09/09/2024 and 08/30/2024 FINDINGS: CT HEAD FINDINGS Brain: No evidence of intracranial hemorrhage, acute infarction,  hydrocephalus, extra-axial collection, or mass lesion/mass effect. Moderate diffuse cerebral atrophy and severe chronic small vessel disease show no significant change. Vascular:  No hyperdense vessel or other acute findings. Skull: No evidence of fracture or other significant bone abnormality. Sinuses/Orbits: Complete opacification of left maxillary sinus and extension of soft tissue density through the medial wall and into the left nasal cavity is unchanged in appearance. Other: None. CT CERVICAL SPINE FINDINGS Alignment: Normal. Skull base and vertebrae: No acute fracture. No primary bone lesion or focal pathologic process. Soft tissues and spinal canal: No prevertebral fluid or swelling. No visible canal hematoma. Disc levels: Mild degenerative disc disease and bilateral uncovertebral spurring seen from level of C3-C7. Upper chest: No acute findings. Other: None. IMPRESSION: No acute intracranial abnormality. Stable cerebral atrophy and chronic small vessel disease. No evidence of acute cervical spine fracture or subluxation. Degenerative spondylosis, as described above. Stable complete opacification of left maxillary sinus, with extension of soft tissue density through the medial wall and into the left nasal cavity. Differential diagnosis includes chronic  sinusitis in neoplasm. Suggest ENT consultation if not already obtained. Electronically Signed   By: Norleen DELENA Kil M.D.   On: 09/26/2024 17:01   CT Head Wo Contrast Result Date: 09/26/2024 CLINICAL DATA:  Unwitnessed fall. Blunt head and neck trauma. Scalp laceration. Dementia. EXAM: CT HEAD WITHOUT CONTRAST CT CERVICAL SPINE WITHOUT CONTRAST TECHNIQUE: Multidetector CT imaging of the head and cervical spine was performed following the standard protocol without intravenous contrast. Multiplanar CT image reconstructions of the cervical spine were also generated. RADIATION DOSE REDUCTION: This exam was performed according to the departmental dose-optimization  program which includes automated exposure control, adjustment of the mA and/or kV according to patient size and/or use of iterative reconstruction technique. COMPARISON:  09/09/2024 and 08/30/2024 FINDINGS: CT HEAD FINDINGS Brain: No evidence of intracranial hemorrhage, acute infarction, hydrocephalus, extra-axial collection, or mass lesion/mass effect. Moderate diffuse cerebral atrophy and severe chronic small vessel disease show no significant change. Vascular:  No hyperdense vessel or other acute findings. Skull: No evidence of fracture or other significant bone abnormality. Sinuses/Orbits: Complete opacification of left maxillary sinus and extension of soft tissue density through the medial wall and into the left nasal cavity is unchanged in appearance. Other: None. CT CERVICAL SPINE FINDINGS Alignment: Normal. Skull base and vertebrae: No acute fracture. No primary bone lesion or focal pathologic process. Soft tissues and spinal canal: No prevertebral fluid or swelling. No visible canal hematoma. Disc levels: Mild degenerative disc disease and bilateral uncovertebral spurring seen from level of C3-C7. Upper chest: No acute findings. Other: None. IMPRESSION: No acute intracranial abnormality. Stable cerebral atrophy and chronic small vessel disease. No evidence of acute cervical spine fracture or subluxation. Degenerative spondylosis, as described above. Stable complete opacification of left maxillary sinus, with extension of soft tissue density through the medial wall and into the left nasal cavity. Differential diagnosis includes chronic sinusitis in neoplasm. Suggest ENT consultation if not already obtained. Electronically Signed   By: Norleen DELENA Kil M.D.   On: 09/26/2024 17:01     .Laceration Repair  Date/Time: 09/26/2024 5:30 PM  Performed by: Bernard Drivers, MD Authorized by: Bernard Drivers, MD   Consent:    Consent obtained:  Verbal   Consent given by:  Patient Anesthesia:    Anesthesia method:   Local infiltration   Local anesthetic:  Lidocaine  2% WITH epi Laceration details:    Location:  Scalp   Scalp location:  Occipital   Length (cm):  3 Pre-procedure details:    Preparation:  Patient was prepped and draped in usual sterile fashion and imaging obtained to evaluate for foreign bodies Exploration:    Imaging outcome: foreign body not noted     Wound exploration: entire depth of wound visualized     Contaminated: no   Treatment:    Area cleansed with:  Saline   Amount of cleaning:  Standard   Irrigation solution:  Sterile saline Skin repair:    Repair method:  Sutures   Suture size:  4-0   Suture material:  Prolene   Number of sutures:  3 Repair type:    Repair type:  Simple Post-procedure details:    Dressing:  Antibiotic ointment   Procedure completion:  Tolerated well, no immediate complications    Medications Ordered in the ED  lidocaine -EPINEPHrine  (XYLOCAINE  W/EPI) 2 %-1:200000 (PF) injection 20 mL (has no administration in time range)  Medical Decision Making Problems Addressed: Accidental fall, initial encounter: acute illness or injury with systemic symptoms that poses a threat to life or bodily functions AKI (acute kidney injury): acute illness or injury Elevated serum creatinine: acute illness or injury Laceration of scalp, initial encounter: acute illness or injury with systemic symptoms that poses a threat to life or bodily functions  Amount and/or Complexity of Data Reviewed Independent Historian: EMS    Details: Ems, fam, hx External Data Reviewed: notes. Labs: ordered. Decision-making details documented in ED Course. Radiology: ordered and independent interpretation performed. Decision-making details documented in ED Course. ECG/medicine tests: ordered and independent interpretation performed. Decision-making details documented in ED Course.  Risk Prescription drug management. Decision regarding  hospitalization.   Iv ns. Continuous pulse ox and cardiac monitoring. Labs ordered/sent. Imaging ordered.   Differential diagnosis includes head injury, sdh, contusion, fracture, etc. Dispo decision including potential need for admission considered - will get labs and imaging and reassess.   Reviewed nursing notes and prior charts for additional history. External reports reviewed. Additional history from: EMS, family.   Cardiac monitor: sinus rhythm, rate 78.  Labs reviewed/interpreted by me - wbc 6, hct 34. Chem, mild aki on mild ckd - LR bolus, hold next lasix dose, close pcp f/u. K normal.   CT reviewed/interpreted by me - no fx or hem.   Po fluids/food.   Recheck, alert, content, no distress. Pt appears stable for ED d/c.   Rec close pcp f/u.  Return precautions provided.       Final diagnoses:  None    ED Discharge Orders     None          Bernard Drivers, MD 09/26/24 1737  "

## 2024-09-26 NOTE — ED Notes (Signed)
 Patient transported to CT

## 2024-09-26 NOTE — Discharge Instructions (Addendum)
 It was our pleasure to provide your ER care today - we hope that you feel better.  Keep wound very clean - have sutures removed, your doctor/RN in 7-10 days.   Fall precautions.   From today's labs, your kidney function test is mildly elevated - make sure  to drink plenty of fluids/stay well hydrated, hold your lasix dose for the next day, and follow up with your doctor for recheck in the next 2-3 days.   Return to ER if worse, new symptoms, fevers, new/severe pain, infection of wound, or other concern.

## 2024-09-26 NOTE — ED Triage Notes (Addendum)
 PER EMS: pt arrives from [facility] with c/o fall, was found on the floor by staff. Last seen 20 mins prior to them finding her. She has a laceration to the back of her head. Fall was unwitnessed, does not remember how or why she fell. Unknown LOC. Hx of dementia and is at baseline mentation currently per staff. Staff at nursing home told EMS she takes a blood thinner but according to the [facility] from the facility there is no blood thinners. Son also states she does not take a blood thinner. Pt arrives in a c-collar.  BP-140/80, HR-80, O2-97% RA\ CBG-154

## 2024-09-26 NOTE — ED Notes (Signed)
 Pts son verbalized understanding of d/c instructions and follow up care. He stated he would be driving her back to [place].

## 2024-10-01 ENCOUNTER — Ambulatory Visit (INDEPENDENT_AMBULATORY_CARE_PROVIDER_SITE_OTHER)

## 2024-10-08 ENCOUNTER — Telehealth (INDEPENDENT_AMBULATORY_CARE_PROVIDER_SITE_OTHER): Payer: Self-pay

## 2024-10-08 NOTE — Telephone Encounter (Signed)
 Patient's son called in with questions and concerns about the possible biopsy at next visit.  He states that if a biopsy is done he would prefer if it was done in a hospital setting due to the patient's age and living conditions.  Would like to speak with Dr Anice before the appointment.  Please call.

## 2024-10-12 ENCOUNTER — Ambulatory Visit (INDEPENDENT_AMBULATORY_CARE_PROVIDER_SITE_OTHER)

## 2024-10-29 ENCOUNTER — Ambulatory Visit (INDEPENDENT_AMBULATORY_CARE_PROVIDER_SITE_OTHER)
